# Patient Record
Sex: Female | Born: 1935 | Race: White | Hispanic: No | Smoking: Never smoker
Health system: Southern US, Community
[De-identification: ages and names within clinical notes are randomized; demographics above are authoritative.]

## PROBLEM LIST (undated history)

## (undated) DIAGNOSIS — K55069 Acute infarction of intestine, part and extent unspecified: Secondary | ICD-10-CM

## (undated) DIAGNOSIS — K859 Acute pancreatitis without necrosis or infection, unspecified: Secondary | ICD-10-CM

## (undated) DIAGNOSIS — I81 Portal vein thrombosis: Secondary | ICD-10-CM

## (undated) DIAGNOSIS — S7290XA Unspecified fracture of unspecified femur, initial encounter for closed fracture: Secondary | ICD-10-CM

## (undated) DIAGNOSIS — B1920 Unspecified viral hepatitis C without hepatic coma: Secondary | ICD-10-CM

## (undated) DIAGNOSIS — C259 Malignant neoplasm of pancreas, unspecified: Secondary | ICD-10-CM

## (undated) DIAGNOSIS — K831 Obstruction of bile duct: Secondary | ICD-10-CM

## (undated) DIAGNOSIS — E119 Type 2 diabetes mellitus without complications: Secondary | ICD-10-CM

## (undated) DIAGNOSIS — K8689 Other specified diseases of pancreas: Secondary | ICD-10-CM

## (undated) HISTORY — PX: ABDOMINAL HYSTERECTOMY: SHX81

## (undated) HISTORY — DX: Type 2 diabetes mellitus without complications: E11.9

## (undated) HISTORY — PX: APPENDECTOMY: SHX54

## (undated) HISTORY — PX: CHOLECYSTECTOMY: SHX55

---

## 1979-06-19 HISTORY — PX: TOTAL HIP ARTHROPLASTY: SHX124

## 2003-06-19 HISTORY — PX: INTRACAPSULAR CATARACT EXTRACTION: SHX361

## 2007-04-19 HISTORY — PX: OTHER SURGICAL HISTORY: SHX169

## 2007-05-02 ENCOUNTER — Inpatient Hospital Stay: Payer: Self-pay | Admitting: Specialist

## 2007-05-02 ENCOUNTER — Other Ambulatory Visit: Payer: Self-pay

## 2007-05-07 ENCOUNTER — Encounter: Payer: Self-pay | Admitting: Internal Medicine

## 2007-05-19 ENCOUNTER — Encounter: Payer: Self-pay | Admitting: Internal Medicine

## 2008-06-03 ENCOUNTER — Ambulatory Visit: Payer: Self-pay | Admitting: Ophthalmology

## 2008-06-22 ENCOUNTER — Ambulatory Visit: Payer: Self-pay | Admitting: Ophthalmology

## 2013-02-01 ENCOUNTER — Ambulatory Visit: Payer: Self-pay | Admitting: Family Medicine

## 2014-08-08 ENCOUNTER — Ambulatory Visit: Payer: Self-pay

## 2014-08-09 ENCOUNTER — Inpatient Hospital Stay: Payer: Self-pay | Admitting: Internal Medicine

## 2014-10-17 NOTE — H&P (Signed)
PATIENT NAMEKETSIA, [name] MR#:  [id number] DATE OF BIRTH:  1928-10-02  DATE OF ADMISSION:  08/09/2014  PRIMARY CARE PHYSICIAN:  Dr. Halina Maidens.    REFERRING EMERGENCY ROOM PHYSICIAN: Dr. Owens Shark.   CHIEF COMPLAINT: Abdominal pain.   HISTORY OF PRESENT ILLNESS: This very pleasant 81 year old woman with past medical history of chronic pancreatitis, hepatitis C, and newly diagnosed diabetes, presents today with midepigastric abdominal pain for several days. She actually saw her primary care provider yesterday with the same complaints, and was found to have pancreatitis with lipase elevated at 1400, hypokalemia with a potassium of 3, and a urinary tract infection. She was sent home with prescriptions for potassium and ciprofloxacin and told that if her pain continued she should present to the Emergency Room for further evaluation and so she has done so today. She describes a dull aching pain in the mid epigastrium. She has not had any nausea or vomiting, no diarrhea. She does not take her Creon as prescribed because she does not like it. She was recently changed from another brand of pancreatic enzyme. She drinks 2 alcoholic beverages daily. She does not know if her triglycerides have been elevated in the past. She has had several episodes of pancreatitis in the remote past, none recently. On presentation her lipase is 1218. Also of note she has been very constipated recently, she then took senna over-the-counter and has had diarrhea for the past 2 days.   PAST MEDICAL HISTORY:  1. Chronic pancreatitis.  2. Hepatitis C not currently treated.   PAST SURGICAL HISTORY:  1. Total hysterectomy.  2. Cataract surgery.  3. Cholecystectomy.  4. Left hip replacement and repair of left femur.   SOCIAL HISTORY: The patient lives with her daughter [name]. She has a cane, but does not use it very frequently. She has never been a cigarette smoker. She drinks about 2 glasses of wine daily. She does not use  any illicit substances. She is originally from [place].   FAMILY MEDICAL HISTORY: She does not know her family medical history, as her entire family died early.   HOME MEDICATIONS:  1. Senokot 50 mg-8.6 mg 1 tablet once a day at bedtime as needed for constipation.  2. Ondansetron 8 mg 3 times a day as needed for nausea and vomiting.  3. Klor-Con 20 mEq 1 tablet 2 times a day.  4. Creon 6000 units-19,000 units-30,000 units delayed release capsule, 3 capsules orally 3 times a day with meals.  5. Ciprofloxacin 500 mg 1 tablet twice a day.  6. Aspirin 81 mg 1 tablet 2 times a week.       REVIEW OF SYSTEMS:  CONSTITUTIONAL: Negative for fevers or chills. Positive for fatigue, weakness, and abdominal pain. No change in weight.  HEENT: No pain in eyes or ears. No change in vision or hearing. No sore throat or sinus congestion. No difficulty swallowing.  RESPIRATORY: No cough, wheezing, shortness of breath, hemoptysis, or painful respirations.  CARDIOVASCULAR: No chest pain, orthopnea, edema, or palpitations.  GASTROINTESTINAL: No vomiting. She has had some nausea. She has had diarrhea after taking senna, but before that was constipated. She does have midepigastric abdominal pain. No hematemesis, melena, or hematochezia.  GENITOURINARY: Positive for dysuria and frequency.  ENDOCRINE: No polyuria or polydipsia. No hot or cold intolerance.  SKIN: No new rashes or lesions.  MUSCULOSKELETAL: No new pain in the neck, back, shoulders, knees, or hips. She does have arthritis and has a little bit of difficulty moving her  left hip which has been replaced. No swelling, no gout.  NEUROLOGIC: No focal numbness or weakness. No history of CVA or seizure, no dementia or memory loss.  PSYCHIATRIC: The patient is calm. No bipolar disorder or schizophrenia.   PHYSICAL EXAMINATION:  VITAL SIGNS: Temperature 97.6, pulse 77, respirations 16, blood pressure 188/75, oxygenation 98% on room air.  GENERAL: No acute  distress.  HEENT: Pupils equal, round, and reactive to light. Conjunctivae clear. Extraocular motion intact. Oral mucous membranes are pink and moist. Posterior oropharynx is clear with no exudate, edema, or erythema.  NECK: Supple, trachea midline. No cervical lymphadenopathy. Thyroid is nontender.  RESPIRATORY: Lungs clear to auscultation bilaterally with good air movement.  CARDIOVASCULAR: Regular rate and rhythm. No murmurs, rubs, or gallops. No peripheral edema. Peripheral pulses are 2 +.  ABDOMEN: Abdomen is obese, slightly distended. There is tenderness to palpation in the mid epigastric area. No guarding, no rebound, no tenderness in other areas of the abdomen. No hepatosplenomegaly noted. No mass.  MUSCULOSKELETAL: No joint effusions. Range of motion of all joints except for the left hip is normal. She has weakness of the left quadriceps and decreased range of motion, otherwise strength is 5 out of 5 throughout.   SKIN: No rashes, lesions, or wounds.  NEUROLOGIC: Cranial nerves II through XII grossly intact. Strength and sensation intact and equal bilaterally, nonfocal.  PSYCHIATRIC: The patient alert and oriented x 4 with good insight into her clinical situation. No signs of uncontrolled depression or anxiety.   LABORATORY DATA: Sodium 137, potassium 3.7, chloride 104, bicarbonate 24, BUN 17, creatinine 0.89, glucose 301. Total cholesterol 168, LDL 85, HDL 65. Lipase 1218. Hemoglobin A1c is 7.4. LFTs are normal. Troponin less than 0.02. White blood cells 8.6, hemoglobin 13.8, platelets 255,000, MCV 96. UA is positive for infection with 114 white blood cells per high-powered field and 23 red blood cells per high-powered field.       IMAGING: There has been no imaging.   ASSESSMENT AND PLAN:  1.  Pancreatitis: Acute on chronic pancreatitis likely due to persistent alcohol use and noncompliance with pancreatic replacement enzymes. She is being admitted for IV fluid hydration and she will be  kept n.p.o. Pain control with Percocet for moderate pain and IV morphine for severe pain. She does not seem to be in too much distress at this time. 2.  Hyperglycemia: Hemoglobin A1c is elevated at 7.4, however in this age group at age 49 the A1c goal is actually 8 and less, so she would not be a candidate for aggressive blood sugar management. I think that her blood sugar today is elevated due to the pancreatitis and volume depletion. We will provide IV hydration, put her on sliding scale. Could consider metformin on discharge but lifestyle modification would probably be adequate.  3.  Urinary tract infection: Obtain urine culture. We will start Rocephin daily. She has had only 1-2 doses of ciprofloxacin, so hopefully culture we will revealing.  4.  Hypokalemia: Yesterday she was hypokalemic at her primary care office with a potassium of 3.0, this is normal now. Continue to monitor.  5.  Hypertension: On presentation her blood pressure is elevated at 188/75. She has received hydralazine once in the Emergency Room. She is not on anything at home for hypertension. It may be beneficial to start this patient on lisinopril 20 mg daily if her blood pressures continue to be elevated.  6.  Prophylaxis: Heparin for DVT prophylaxis, no GI prophylaxis at this time.  TIME SPENT ON ADMISSION: 40 minutes.     ____________________________ Earleen Newport. Volanda Napoleon, MD cpw:bu D: 08/09/2014 16:16:43 ET T: 08/09/2014 16:50:02 ET JOB#: [id number]  cc: Barnetta Chapel P. Volanda Napoleon, MD, <Dictator> Aldean Jewett MD ELECTRONICALLY SIGNED 08/10/2014 8:22

## 2014-10-17 NOTE — Discharge Summary (Signed)
PATIENT [name], MAYO MR#:  [id number] DATE OF BIRTH:  Jun 03, 1929  DATE OF ADMISSION:  08/09/2014 DATE OF DISCHARGE:  08/11/2014  PRESENTING COMPLAINT: Abdominal pain.   DISCHARGE DIAGNOSES:  1.  Acute on chronic pancreatitis.  2.  History of hepatitis C.  3.  Hypertension, new diagnosis. 4.  Hyperglycemia with A1c of 7.4.  5.  Urinary tract infection.  CODE STATUS: Full code.   MEDICATIONS:  1.  Klor-Con 20 mEq 1 tablet daily for 1 week.  2.  Creon 3 capsules 3 times a day with meals.  3.  Zofran 8 mg p.r.n. as needed.  4.  Senokot S 1 tablet daily at bedtime as needed.  5.  Aspirin 81 mg 2 times a week.  6.  Keflex 500 mg b.i.d.  7.  Amlodipine 5 mg daily.    DIET: Mechanical soft diet.   FOLLOWUP: With Dr. Halina Maidens in 1-2 weeks.   LABORATORY DATA: Lipase at admission was 1218, lipase at discharge was 309.   HOSPITAL COURSE: [name] is an 81 year old Caucasian female with history of chronic pancreatitis and history of Hepatitis C who comes into the Emergency Room with: 1.  Pancreatitis, acute on chronic, likely due to persistent on and off alcohol use and noncompliance with pancreatic replacement enzymes. The patient was kept n.p.o., continued IV fluids. Her lipase came down from 1200 to 306. She remained asymptomatic, tolerating soft diet prior to discharge. She was advised to resume back her pancreatic replacement enzymes as well.  2.  Hyperglycemia A1c is elevated at 7.4. The patient will hold off on starting any p.o. medications. We will defer to primary care physician.  3.  Urinary tract infection on p.o. Keflex.  4.  Hypokalemia repleted.  5.  Hypertension, on presentation in Emergency Room blood pressure was 188/75. She received some hydralazine p.r.n. in the Emergency Room. After discussing with the patient we started on amlodipine 5 mg daily. She will follow up with her primary care physician.  6.  The patient was kept on deep vein thrombosis  prophylaxis with subcutaneous heparin. Hospital stay otherwise remained stable.   CODE STATUS: The patient remained a full code.   TIME SPENT: 40 minutes.   ____________________________ Hart Rochester Posey Pronto, MD sap:mc D: 08/12/2014 06:38:13 ET T: 08/12/2014 14:12:57 ET JOB#: [id number]  cc: Koven Belinsky A. Posey Pronto, MD, <Dictator> Halina Maidens, MD  Ilda Basset MD ELECTRONICALLY SIGNED 08/17/2014 17:36

## 2014-10-18 LAB — LIPID PANEL
Cholesterol: 144 mg/dL (ref 0–200)
HDL: 37 mg/dL (ref 35–70)
LDL Cholesterol: 86 mg/dL
Triglycerides: 107 mg/dL (ref 40–160)

## 2014-10-18 LAB — HEMOGLOBIN A1C: Hgb A1c MFr Bld: 9.5 % — AB (ref 4.0–6.0)

## 2014-10-19 ENCOUNTER — Encounter: Payer: Self-pay | Admitting: Emergency Medicine

## 2014-10-19 ENCOUNTER — Inpatient Hospital Stay
Admission: EM | Admit: 2014-10-19 | Discharge: 2014-10-25 | DRG: 438 | Disposition: A | Discharge status: Home / Self Care | Payer: Commercial Managed Care - HMO | Attending: Internal Medicine | Admitting: Internal Medicine

## 2014-10-19 ENCOUNTER — Emergency Department: Payer: Commercial Managed Care - HMO

## 2014-10-19 ENCOUNTER — Other Ambulatory Visit: Payer: Commercial Managed Care - HMO

## 2014-10-19 DIAGNOSIS — Z96649 Presence of unspecified artificial hip joint: Secondary | ICD-10-CM | POA: Diagnosis present

## 2014-10-19 DIAGNOSIS — R112 Nausea with vomiting, unspecified: Secondary | ICD-10-CM | POA: Diagnosis not present

## 2014-10-19 DIAGNOSIS — I10 Essential (primary) hypertension: Secondary | ICD-10-CM | POA: Diagnosis present

## 2014-10-19 DIAGNOSIS — E876 Hypokalemia: Secondary | ICD-10-CM | POA: Diagnosis present

## 2014-10-19 DIAGNOSIS — B1920 Unspecified viral hepatitis C without hepatic coma: Secondary | ICD-10-CM | POA: Diagnosis present

## 2014-10-19 DIAGNOSIS — Z79899 Other long term (current) drug therapy: Secondary | ICD-10-CM | POA: Diagnosis not present

## 2014-10-19 DIAGNOSIS — E1165 Type 2 diabetes mellitus with hyperglycemia: Secondary | ICD-10-CM | POA: Diagnosis present

## 2014-10-19 DIAGNOSIS — K868 Other specified diseases of pancreas: Principal | ICD-10-CM | POA: Diagnosis present

## 2014-10-19 DIAGNOSIS — K861 Other chronic pancreatitis: Secondary | ICD-10-CM | POA: Diagnosis present

## 2014-10-19 DIAGNOSIS — M810 Age-related osteoporosis without current pathological fracture: Secondary | ICD-10-CM | POA: Diagnosis present

## 2014-10-19 DIAGNOSIS — I82890 Acute embolism and thrombosis of other specified veins: Secondary | ICD-10-CM | POA: Diagnosis present

## 2014-10-19 DIAGNOSIS — K859 Acute pancreatitis without necrosis or infection, unspecified: Secondary | ICD-10-CM | POA: Insufficient documentation

## 2014-10-19 DIAGNOSIS — H6690 Otitis media, unspecified, unspecified ear: Secondary | ICD-10-CM | POA: Diagnosis present

## 2014-10-19 DIAGNOSIS — R1013 Epigastric pain: Secondary | ICD-10-CM | POA: Diagnosis present

## 2014-10-19 DIAGNOSIS — K8689 Other specified diseases of pancreas: Secondary | ICD-10-CM | POA: Diagnosis present

## 2014-10-19 DIAGNOSIS — K55069 Acute infarction of intestine, part and extent unspecified: Secondary | ICD-10-CM

## 2014-10-19 DIAGNOSIS — I81 Portal vein thrombosis: Secondary | ICD-10-CM | POA: Diagnosis present

## 2014-10-19 DIAGNOSIS — Z66 Do not resuscitate: Secondary | ICD-10-CM | POA: Diagnosis present

## 2014-10-19 HISTORY — DX: Unspecified viral hepatitis C without hepatic coma: B19.20

## 2014-10-19 HISTORY — DX: Unspecified fracture of unspecified femur, initial encounter for closed fracture: S72.90XA

## 2014-10-19 HISTORY — DX: Acute pancreatitis without necrosis or infection, unspecified: K85.90

## 2014-10-19 LAB — CBC WITH DIFFERENTIAL/PLATELET
BASOS ABS: 0.1 10*3/uL (ref 0–0.1)
Basophils Relative: 1 %
EOS ABS: 0.1 10*3/uL (ref 0–0.7)
Eosinophils Relative: 1 %
HEMATOCRIT: 37.3 % (ref 35.0–47.0)
HEMOGLOBIN: 12.1 g/dL (ref 12.0–16.0)
Lymphocytes Relative: 6 %
Lymphs Abs: 0.5 10*3/uL — ABNORMAL LOW (ref 1.0–3.6)
MCH: 31.1 pg (ref 26.0–34.0)
MCHC: 32.6 g/dL (ref 32.0–36.0)
MCV: 95.4 fL (ref 80.0–100.0)
MONO ABS: 0.9 10*3/uL (ref 0.2–0.9)
NEUTROS ABS: 7 10*3/uL — AB (ref 1.4–6.5)
Neutrophils Relative %: 81 %
Platelets: 426 10*3/uL (ref 150–440)
RBC: 3.91 MIL/uL (ref 3.80–5.20)
RDW: 13.9 % (ref 11.5–14.5)
WBC: 8.6 10*3/uL (ref 3.6–11.0)

## 2014-10-19 LAB — COMPREHENSIVE METABOLIC PANEL
ALT: 17 U/L (ref 14–54)
ANION GAP: 13 (ref 5–15)
AST: 25 U/L (ref 15–41)
Albumin: 2.9 g/dL — ABNORMAL LOW (ref 3.5–5.0)
Alkaline Phosphatase: 84 U/L (ref 38–126)
BUN: 19 mg/dL (ref 6–20)
CO2: 25 mmol/L (ref 22–32)
Calcium: 8.7 mg/dL — ABNORMAL LOW (ref 8.9–10.3)
Chloride: 103 mmol/L (ref 101–111)
Creatinine, Ser: 0.5 mg/dL (ref 0.44–1.00)
GFR calc Af Amer: >60 mL/min (ref >60)
GFR calc non Af Amer: >60 mL/min (ref >60)
Glucose, Bld: 212 mg/dL — ABNORMAL HIGH (ref 65–99)
Potassium: 4 mmol/L (ref 3.5–5.1)
Sodium: 141 mmol/L (ref 135–145)
TOTAL PROTEIN: 7.2 g/dL (ref 6.5–8.1)
Total Bilirubin: 1.1 mg/dL (ref 0.3–1.2)

## 2014-10-19 LAB — URINALYSIS COMPLETE WITH MICROSCOPIC (ARMC ONLY)
Bacteria, UA: NONE SEEN
Bilirubin Urine: NEGATIVE
Glucose, UA: 150 mg/dL — AB
Nitrite: NEGATIVE
PROTEIN: 30 mg/dL — AB
Specific Gravity, Urine: 1.025 (ref 1.005–1.030)
pH: 5 (ref 5.0–8.0)

## 2014-10-19 LAB — CBC
HCT: 33.3 % — ABNORMAL LOW (ref 35.0–47.0)
HEMOGLOBIN: 11.1 g/dL — AB (ref 12.0–16.0)
MCH: 31.5 pg (ref 26.0–34.0)
MCHC: 33.4 g/dL (ref 32.0–36.0)
MCV: 94.1 fL (ref 80.0–100.0)
PLATELETS: 365 10*3/uL (ref 150–440)
RBC: 3.54 MIL/uL — ABNORMAL LOW (ref 3.80–5.20)
RDW: 13.8 % (ref 11.5–14.5)
WBC: 8.2 10*3/uL (ref 3.6–11.0)

## 2014-10-19 LAB — GLUCOSE, CAPILLARY
Glucose-Capillary: 191 mg/dL — ABNORMAL HIGH (ref 70–99)
Glucose-Capillary: 191 mg/dL — ABNORMAL HIGH (ref 70–99)

## 2014-10-19 LAB — PROTIME-INR
INR: 1.01
PROTHROMBIN TIME: 13.5 s (ref 11.4–15.0)

## 2014-10-19 LAB — CREATININE, SERUM
Creatinine, Ser: 0.33 mg/dL — ABNORMAL LOW (ref 0.44–1.00)
GFR calc non Af Amer: >60 mL/min (ref >60)

## 2014-10-19 LAB — LIPASE, BLOOD: LIPASE: 134 U/L — AB (ref 22–51)

## 2014-10-19 MED ORDER — ONDANSETRON HCL 4 MG/2ML IJ SOLN
4.0000 mg | Freq: Once | INTRAMUSCULAR | Status: AC
  Administered 2014-10-19: 4 mg via INTRAVENOUS

## 2014-10-19 MED ORDER — PANCRELIPASE (LIP-PROT-AMYL) 12000-38000 UNITS PO CPEP
24000.0000 [IU] | ORAL_CAPSULE | Freq: Three times a day (TID) | ORAL | Status: DC
Start: 2014-10-20 — End: 2014-10-19

## 2014-10-19 MED ORDER — INSULIN ASPART 100 UNIT/ML ~~LOC~~ SOLN: 0.0000 [IU] | Freq: Three times a day (TID) | SUBCUTANEOUS | Status: DC

## 2014-10-19 MED ORDER — HEPARIN (PORCINE) IN NACL 100-0.45 UNIT/ML-% IJ SOLN
780.0000 [IU]/h | INTRAMUSCULAR | Status: DC
  Administered 2014-10-19: 20:00:00 800 [IU]/h via INTRAVENOUS
  Filled 2014-10-19: qty 250

## 2014-10-19 MED ORDER — HEPARIN (PORCINE) IN NACL 100-0.45 UNIT/ML-% IJ SOLN: 10.0000 [IU]/kg/h | INTRAMUSCULAR | Status: DC

## 2014-10-19 MED ORDER — IOHEXOL 300 MG/ML  SOLN
100.0000 mL | Freq: Once | INTRAMUSCULAR | Status: AC | PRN
  Administered 2014-10-19: 100 mL via INTRAVENOUS

## 2014-10-19 MED ORDER — FENTANYL CITRATE (PF) 100 MCG/2ML IJ SOLN
INTRAMUSCULAR | Status: AC
  Administered 2014-10-19: 50 ug via INTRAVENOUS
  Filled 2014-10-19: qty 2

## 2014-10-19 MED ORDER — SENNOSIDES-DOCUSATE SODIUM 8.6-50 MG PO TABS: 1.0000 | ORAL_TABLET | Freq: Every evening | ORAL | Status: DC | PRN

## 2014-10-19 MED ORDER — PANCRELIPASE (LIP-PROT-AMYL) 12000-38000 UNITS PO CPEP
12000.0000 [IU] | ORAL_CAPSULE | Freq: Three times a day (TID) | ORAL | Status: DC | PRN
  Administered 2014-10-20: 14:00:00 12000 [IU] via ORAL
  Filled 2014-10-19 (×2): qty 1

## 2014-10-19 MED ORDER — SODIUM CHLORIDE 0.9 % IV BOLUS (SEPSIS)
1000.0000 mL | Freq: Once | INTRAVENOUS | Status: AC
  Administered 2014-10-19: 1000 mL via INTRAVENOUS

## 2014-10-19 MED ORDER — INSULIN ASPART 100 UNIT/ML ~~LOC~~ SOLN
0.0000 [IU] | Freq: Three times a day (TID) | SUBCUTANEOUS | Status: DC
  Administered 2014-10-20: 14:00:00 3 [IU] via SUBCUTANEOUS
  Administered 2014-10-20: 17:00:00 2 [IU] via SUBCUTANEOUS
  Administered 2014-10-20: 5 [IU] via SUBCUTANEOUS
  Administered 2014-10-21 – 2014-10-24 (×7): 2 [IU] via SUBCUTANEOUS
  Administered 2014-10-24: 13:00:00 3 [IU] via SUBCUTANEOUS
  Administered 2014-10-24: 2 [IU] via SUBCUTANEOUS
  Administered 2014-10-25: 09:00:00 via SUBCUTANEOUS
  Administered 2014-10-25: 12:00:00 5 [IU] via SUBCUTANEOUS
  Filled 2014-10-19 (×4): qty 2
  Filled 2014-10-19: qty 3
  Filled 2014-10-19: qty 5
  Filled 2014-10-19 (×4): qty 2
  Filled 2014-10-19: qty 5
  Filled 2014-10-19: qty 2
  Filled 2014-10-19: qty 5
  Filled 2014-10-19: qty 2
  Filled 2014-10-19: qty 1

## 2014-10-19 MED ORDER — HEPARIN SODIUM (PORCINE) 5000 UNIT/ML IJ SOLN: 5000.0000 [IU] | Freq: Three times a day (TID) | INTRAMUSCULAR | Status: DC

## 2014-10-19 MED ORDER — ALUM & MAG HYDROXIDE-SIMETH 200-200-20 MG/5ML PO SUSP: 30.0000 mL | Freq: Four times a day (QID) | ORAL | Status: DC | PRN

## 2014-10-19 MED ORDER — FENTANYL CITRATE (PF) 100 MCG/2ML IJ SOLN
50.0000 ug | Freq: Once | INTRAMUSCULAR | Status: AC
  Administered 2014-10-19: 50 ug via INTRAVENOUS

## 2014-10-19 MED ORDER — ONDANSETRON HCL 4 MG/2ML IJ SOLN
INTRAMUSCULAR | Status: AC
  Filled 2014-10-19: qty 2

## 2014-10-19 MED ORDER — ONDANSETRON HCL 4 MG PO TABS
4.0000 mg | ORAL_TABLET | Freq: Four times a day (QID) | ORAL | Status: DC | PRN
  Filled 2014-10-19: qty 1

## 2014-10-19 MED ORDER — ONDANSETRON HCL 4 MG/2ML IJ SOLN: 4.0000 mg | Freq: Once | INTRAMUSCULAR | Status: AC

## 2014-10-19 MED ORDER — DOCUSATE SODIUM 100 MG PO CAPS
100.0000 mg | ORAL_CAPSULE | Freq: Two times a day (BID) | ORAL | Status: DC
  Administered 2014-10-20 – 2014-10-25 (×12): 100 mg via ORAL
  Filled 2014-10-19 (×11): qty 1

## 2014-10-19 MED ORDER — ONDANSETRON HCL 4 MG/2ML IJ SOLN
INTRAMUSCULAR | Status: AC
  Administered 2014-10-19: 4 mg via INTRAVENOUS
  Filled 2014-10-19: qty 2

## 2014-10-19 MED ORDER — MORPHINE SULFATE 4 MG/ML IJ SOLN: 4.0000 mg | Freq: Once | INTRAMUSCULAR | Status: DC

## 2014-10-19 MED ORDER — ACETAMINOPHEN 650 MG RE SUPP: 650.0000 mg | Freq: Four times a day (QID) | RECTAL | Status: DC | PRN

## 2014-10-19 MED ORDER — ONDANSETRON HCL 4 MG/2ML IJ SOLN
4.0000 mg | Freq: Four times a day (QID) | INTRAMUSCULAR | Status: DC | PRN
  Administered 2014-10-19 – 2014-10-20 (×2): 4 mg via INTRAVENOUS
  Filled 2014-10-19 (×2): qty 2

## 2014-10-19 MED ORDER — ACETAMINOPHEN 325 MG PO TABS: 650.0000 mg | ORAL_TABLET | Freq: Four times a day (QID) | ORAL | Status: DC | PRN

## 2014-10-19 MED ORDER — HEPARIN (PORCINE) IN NACL 2-0.9 UNIT/ML-% IJ SOLN
INTRAMUSCULAR | Status: DC
  Filled 2014-10-19: qty 1000

## 2014-10-19 MED ORDER — HYDROMORPHONE HCL 1 MG/ML IJ SOLN
1.0000 mg | INTRAMUSCULAR | Status: DC | PRN
  Administered 2014-10-19 – 2014-10-24 (×5): 1 mg via INTRAVENOUS
  Filled 2014-10-19 (×6): qty 1

## 2014-10-19 MED ORDER — HEPARIN BOLUS VIA INFUSION
3900.0000 [IU] | INTRAVENOUS | Status: AC
  Administered 2014-10-19: 19:00:00 3900 [IU] via INTRAVENOUS
  Filled 2014-10-19: qty 3900

## 2014-10-19 MED ORDER — AMLODIPINE BESYLATE 5 MG PO TABS
5.0000 mg | ORAL_TABLET | Freq: Every day | ORAL | Status: DC
  Administered 2014-10-19 – 2014-10-25 (×6): 5 mg via ORAL
  Filled 2014-10-19 (×7): qty 1

## 2014-10-19 MED ORDER — ONDANSETRON 4 MG PO TBDP: 4.0000 mg | ORAL_TABLET | ORAL | Status: DC | PRN

## 2014-10-19 MED ORDER — PANCRELIPASE (LIP-PROT-AMYL) 12000-38000 UNITS PO CPEP
24000.0000 [IU] | ORAL_CAPSULE | Freq: Three times a day (TID) | ORAL | Status: DC
  Administered 2014-10-19 – 2014-10-25 (×14): 24000 [IU] via ORAL
  Administered 2014-10-25: 09:00:00 12000 [IU] via ORAL
  Filled 2014-10-19 (×14): qty 2

## 2014-10-19 MED ORDER — ONDANSETRON HCL 4 MG/2ML IJ SOLN
4.0000 mg | Freq: Once | INTRAMUSCULAR | Status: AC
  Administered 2014-10-19: 4 mg via INTRAVENOUS
  Filled 2014-10-19: qty 2

## 2014-10-19 NOTE — Progress Notes (Signed)
ANTICOAGULATION CONSULT NOTE - Initial Consult  Pharmacy Consult for Portal Vein Thrombosis Indication: portal vein thrombosis  Allergies  Allergen Reactions  . Aspirin Other (See Comments)    Stomach problems    Patient Measurements: Height: 5\' 2"  (157.5 cm) Weight: 158 lb 11.2 oz (71.986 kg) IBW/kg (Calculated) : 50.1 Heparin Dosing Weight: 65.3 kg  Vital Signs: Temp: 98.4 F (36.9 C) (05/03 2127) Temp Source: Oral (05/03 2127) BP: 155/62 mmHg (05/03 2127) Pulse Rate: 90 (05/03 2127)  Labs:  Recent Labs  10/19/14 1012 10/19/14 1656 10/19/14 1815  HGB 12.1  --  11.1*  HCT 37.3  --  33.3*  PLT 426  --  365  LABPROT  --  13.5  --   INR  --  1.01  --   CREATININE 0.50  --  0.33*    Estimated Creatinine Clearance: 49.5 mL/min (by C-G formula based on Cr of 0.33).   Medical History: Past Medical History  Diagnosis Date  . Pancreatitis   . Hepatitis C   . Femur fracture   . H/O: hysterectomy   . History of cholecystectomy   . Hx of appendectomy     Medications:  Scheduled:  . amLODipine  5 mg Oral Daily  . docusate sodium  100 mg Oral BID  . [START ON 10/20/2014] insulin aspart  0-9 Units Subcutaneous TID WC  . lipase/protease/amylase  24,000 Units Oral TID AC  . ondansetron (ZOFRAN) IV  4 mg Intravenous Once    Assessment:  Goal of Therapy:  Heparin level 0.3-0.7 units/ml Monitor platelets by anticoagulation protocol: Yes   Plan:  Give 3900 units bolus x 1  Begin heparin drip @ 780 units/hr. Draw 1st anti-Xa 8 hrs after start of drip on 5/4 @ 4:00.   Boyd Buffalo D 10/19/2014,10:40 PM

## 2014-10-19 NOTE — H&P (Signed)
[facility] [place]    MR#:  [id number]  DATE OF BIRTH:  February 24, 1929  DATE OF ADMISSION:  10/19/2014  PRIMARY CARE PHYSICIAN: Halina Maidens, MD   REQUESTING/REFERRING PHYSICIAN: Dr. Charlotte Sanes  CHIEF COMPLAINT:   Severe abdominal pain HISTORY OF PRESENT ILLNESS:  [name]  is a 81 y.o. female with a known history of chronic pancreatitis and newly diagnosed hypertension and diabetes who presents with abdominal pain for the past several months. Patient was admitted in February at that time she was diagnosed with chronic pancreatitis. Per family member who is at bedside, her abdominal pain never quite resolved she was recently treated for urinary tract infection. Over the past 2 days her abdominal pain has worsened so she came to the ER for further evaluation. In the ER a CAT scan was performed which showed a pink reticulocyte mass and also portal thrombosis. GI as well as vascular surgery has been consultative. Dr. Georgann Housekeeper recommends starting on heparin drip.  PAST MEDICAL HISTORY:   Past Medical History  Diagnosis Date  . Pancreatitis   . Hepatitis C   . Femur fracture   . H/O: hysterectomy   . History of cholecystectomy   . Hx of appendectomy     PAST SURGICAL HISTOIRY:   Past Surgical History  Procedure Laterality Date  . Total hip arthroplasty    . Abdominal hysterectomy    . Appendectomy    . Cholecystectomy      SOCIAL HISTORY:   History  Substance Use Topics  . Smoking status: Never Smoker   . Smokeless tobacco: Not on file  . Alcohol Use: No    FAMILY HISTORY:  History reviewed. No pertinent family history.  DRUG ALLERGIES:   Allergies  Allergen Reactions  . Aspirin Other (See Comments)    Stomach problems    REVIEW OF SYSTEMS:  CONSTITUTIONAL: No fever, positive fatigue positive weakness.  EYES: No blurred or double vision.  EARS, NOSE, AND THROAT: No tinnitus or ear  pain.  RESPIRATORY: No cough, shortness of breath, wheezing or hemoptysis.  CARDIOVASCULAR: No chest pain, orthopnea, edema.  GASTROINTESTINAL: Positive diffuse abdominal pain associated with nausea and vomiting. GENITOURINARY: No dysuria, hematuria.  ENDOCRINE: No polyuria, nocturia,  HEMATOLOGY: No anemia, easy bruising or bleeding SKIN: No rash or lesion. MUSCULOSKELETAL: No joint pain or arthritis.   NEUROLOGIC: No tingling, numbness, weakness.  PSYCHIATRY: No anxiety or depression.   MEDICATIONS AT HOME:   Prior to Admission medications   Medication Sig Start Date End Date Taking? Authorizing Provider  amLODipine (NORVASC) 5 MG tablet Take 1 tablet by mouth daily. Take one tablet daily 10/08/14  Yes Historical Provider, MD  CREON 6000 UNITS CPEP Take 3 capsules by mouth 3 (three) times daily before meals. Patient may take 1 capsule before snack in between meals 10/13/14  Yes Historical Provider, MD  docusate sodium (COLACE) 100 MG capsule Take 100 mg by mouth 2 (two) times daily.   Yes Historical Provider, MD  metFORMIN (GLUCOPHAGE) 500 MG tablet Take 1 tablet by mouth daily. 10/18/14  Yes Historical Provider, MD  ondansetron (ZOFRAN-ODT) 8 MG disintegrating tablet Take 1 tablet by mouth as needed. 08/08/14   Historical Provider, MD      VITAL SIGNS:  Blood pressure 157/78, pulse 103, temperature 97.9 F (36.6 C), temperature source Oral, resp. rate 23, height 5\' 2"  (1.575 m), weight 71.668 kg (158 lb), SpO2 91 %.  PHYSICAL EXAMINATION:  GENERAL:  81 y.o.-year-old patient lying in the bed with no acute distress.  EYES: Pupils equal, round, reactive to light and accommodation. No scleral icterus. Extraocular muscles intact.  HEENT: Head atraumatic, normocephalic. Oropharynx and nasopharynx clear.  NECK:  Supple, no jugular venous distention. No thyroid enlargement, no tenderness.  LUNGS: Normal breath sounds bilaterally, no wheezing, rales,rhonchi or crepitation. No use of accessory  muscles of respiration.  CARDIOVASCULAR: S1, S2 normal. No murmurs, rubs, or gallops.  ABDOMEN: Soft, nontender, nondistended. Bowel sounds present. No organomegaly EXTREMITIES: No pedal edema, cyanosis, or clubbing.  NEUROLOGIC: Cranial nerves II through XII are intact. Muscle strength 5/5 in all extremities. Sensation intact. Gait not checked.  PSYCHIATRIC: The patient is alert and oriented x 3.  SKIN: No obvious rash, lesion, or ulcer.   LABORATORY PANEL:   CBC  Recent Labs Lab 10/19/14 1012  WBC 8.6  HGB 12.1  HCT 37.3  PLT 426   ------------------------------------------------------------------------------------------------------------------  Chemistries   Recent Labs Lab 10/19/14 1012  NA 141  K 4.0  CL 103  CO2 25  GLUCOSE 212*  BUN 19  CREATININE 0.50  CALCIUM 8.7*  AST 25  ALT 17  ALKPHOS 84  BILITOT 1.1   ------------------------------------------------------------------------------------------------------------------  Cardiac Enzymes No results for input(s): TROPONINI in the last 168 hours. ------------------------------------------------------------------------------------------------------------------  RADIOLOGY:  Ct Abdomen Pelvis W Contrast  10/19/2014    IMPRESSION: 1. There is mild intrahepatic and extrahepatic biliary ductal dilatation. There is a mass in pancreatic body obstructing the main pancreatic duct. Significant dilatation of main pancreatic duct with indistinctness of the body and tail of the pancreas. Probable adjacent pathologic lymph nodes anterior to the pancreas. 2. There is complete thrombosis of portal vein with extension in intrahepatic portal vein. There is complete thrombosis of splenic vein and superior mesenteric vein. Tumor thrombus cannot be excluded. Some collateral vessels are noted in splenic hilum and anterior to left kidney. There is mild stranding of peripancreatic fat and fat surrounding the superior mesenteric artery.  This may be due to edema mild pancreatitis or tumor extension. 3. Atherosclerotic calcifications of abdominal aorta and iliac arteries. No aortic aneurysm. 4. No small bowel obstruction. 5. Moderate size hiatal hernia measures 7 cm. 6. Osteopenia and degenerative changes thoracolumbar spine. These results were called by telephone at the time of interpretation on 10/19/2014 at 3:20 pm to Dr. Ferman Hamming , who verbally acknowledged these results.   Electronically Signed   By: Lahoma Crocker M.D.   On: 10/19/2014 15:21    EKG:   Orders placed or performed during the hospital encounter of 10/19/14  . ED EKG  . ED EKG    IMPRESSION AND PLAN:   1. Abdominal pain: Likely secondary to a mass in pancreatic body obstructing the main pancreatic duct along with thrombosis of splenic vein and superior mesenteric vein and portal vein. She will need pain medications and plan as outlined below for abdominal pain. 2. Pancreatic mass: Patient will require GI consultation she will possibly need an ERCP or a biopsy by radiology. 3. 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 Patient understands these risks and benefits and except these risk, heparin drip will be started. I have also ordered a hypercoagulable panel. 4. Newly diagnosed hypertension: Patient will continue Norvasc 5.Newly diagnosed diabetes: Patient will be placed on sliding scale insulin. 6. Chronic pink otitis: I will continue Creon. GI has been consulted.  All the records are reviewed and case discussed with ED provider. Management plans discussed with the patient, family and they are in agreement.  CODE STATUS: DO NOT RESUSCITATE  TOTAL TIME TAKING CARE OF THIS PATIENT: 50 minutes.    Vika Buske M.D on 10/19/2014 at 4:22 PM  Between 7am to 6pm - Pager - [phone] After 6pm go to [email or web address] - password [email or web address]  [facility]  Office  [phone]  CC: Primary care physician; Halina Maidens, MD

## 2014-10-19 NOTE — ED Provider Notes (Signed)
[facility] Emergency Department Provider Note    ____________________________________________  Time seen: 12:10 PM  I have reviewed the triage vital signs and the nursing notes.   HISTORY  Chief Complaint Nausea  Patient is being interviewed in the emergency department presence of her daughter who is providing a lot of the past medical history and recent medical history.    HPI [name] is a 81 y.o. female who presents to the emergency department with intermittent abdominal pain. Onset of symptoms 2 months ago when she presented to the emergency department with abdominal pain nausea and vomiting and was diagnosed with acute pancreatitis. She was admitted to the hospital at that time and and was dismissed after a 2 day hospital stay and was diagnosed with acute pancreatitis and new onset diabetes. At that time she was dismissed on Creon metformin and Zofran also on amlodipine.  Over the past 2 weeks she has had increased abdominal pain. She describes the pain as diffuse and all over the abdomen it has been constant for the past 3 days it is moderate to severe into severity she ranks it as 7 out of 10 and the pain appears to be aching and stabbing in nature. She is anorexic and since the pain is been constant she hasn't been eating for the past day, and also she has had associated symptoms of constipation, last bowel movement was 2 days ago despite laxatives including Metamucil. She has been drinking fluids today but has not been able to eat because of the pain.  She has not had any change in her recent medication she has not had any recent surgery. She did see Dr. Army Melia yesterday because of the pain at that time laboratory evaluation was done which reviewed hyperglycemia and a possible urinary tract infection. No new medications were given she was instructed to come to the emergency department if her pain continued.     Past Medical History  Diagnosis  Date  . Pancreatitis   . Hepatitis C   . Femur fracture   . H/O: hysterectomy   . History of cholecystectomy   . Hx of appendectomy     Patient Active Problem List   Diagnosis Date Noted  . Pancreatic mass 10/19/2014    Past Surgical History  Procedure Laterality Date  . Total hip arthroplasty    . Abdominal hysterectomy    . Appendectomy    . Cholecystectomy      Current Outpatient Rx  Name  Route  Sig  Dispense  Refill  . amLODipine (NORVASC) 5 MG tablet   Oral   Take 1 tablet by mouth daily. Take one tablet daily         . CREON 6000 UNITS CPEP   Oral   Take 3 capsules by mouth 3 (three) times daily before meals. Patient may take 1 capsule before snack in between meals           Dispense as written.   . docusate sodium (COLACE) 100 MG capsule   Oral   Take 100 mg by mouth 2 (two) times daily.         . metFORMIN (GLUCOPHAGE) 500 MG tablet   Oral   Take 1 tablet by mouth daily.         . ondansetron (ZOFRAN-ODT) 8 MG disintegrating tablet   Oral   Take 1 tablet by mouth as needed.           Allergies Aspirin  History reviewed. No  pertinent family history.  Social History History  Substance Use Topics  . Smoking status: Never Smoker   . Smokeless tobacco: Not on file  . Alcohol Use: No    Review of Systems  Constitutional: Negative for fever. Eyes: Negative for visual changes. ENT: Negative for sore throat. Cardiovascular: Negative for chest pain. Respiratory: Negative for shortness of breath. Gastrointestinal: Positive for abdominal pain, no vomiting and diarrhea. Positive for constipation. Positive for anorexia. Positive for gas. Genitourinary: Negative for dysuria. Musculoskeletal: Negative for back pain. Skin: Negative for rash. Neurological: Negative for headaches, focal weakness or numbness. She appears very anxious.  10-point ROS otherwise negative.  ____________________________________________   PHYSICAL  EXAM:  VITAL SIGNS: ED Triage Vitals  Enc Vitals Group     BP 10/19/14 0955 124/88 mmHg     Pulse Rate 10/19/14 0955 101     Resp 10/19/14 0955 18     Temp 10/19/14 0955 97.9 F (36.6 C)     Temp Source 10/19/14 0955 Oral     SpO2 10/19/14 0955 100 %     Weight 10/19/14 0955 158 lb (71.668 kg)     Height 10/19/14 0955 5\' 2"  (1.575 m)     Head Cir --      Peak Flow --      Pain Score 10/19/14 1001 10     Pain Loc --      Pain Edu? --      Excl. in [place]? --   Initial vital signs reviewed she is afebrile and normotensive and slightly tachycardic.  Complain out of 7 out of 10 pain at the time of the exam Constitutional: Alert and oriented. Appears anxious and tearful. Eyes: Conjunctivae are normal. PERRL. Normal extraocular movements. ENT   Head: Normocephalic and atraumatic.   Nose: No congestion/rhinnorhea.   Mouth/Throat: Mucous membranes are dry   Neck: No stridor. Hematological/Lymphatic/Immunilogical: No cervical lymphadenopathy. Cardiovascular: Normal rate, regular rhythm. Normal and symmetric distal pulses are present in all extremities. No murmurs, rubs, or gallops. Respiratory: Normal respiratory effort without tachypnea nor retractions. Breath sounds are clear and equal bilaterally. No wheezes/rales/rhonchi. Gastrointestinal: Soft but distended and tender. . She is guarding but has no rebound. Bowel sounds are normal. No abdominal bruits. There is no CVA tenderness. Genitourinary: Deferred Musculoskeletal: Nontender with normal range of motion in all extremities. No joint effusions.  No lower extremity tenderness nor edema. Neurologic:  Normal speech and language. No gross focal neurologic deficits are appreciated. Speech is normal. No gait instability. Skin:  Skin is warm, dry and intact. No rash noted. She is not jaundiced Psychiatric: Mood and affect are normal. Speech and behavior are normal. Patient exhibits appropriate insight and  judgment.  ____________________________________________    LABS (pertinent positives/negatives)   The initial laboratory of tests in the emergency Department included a CBC with differential comprehensive medical panel and a lipase. The CBC is normal with no elevation of the WBC she is not anemic. They comprehensive medical panel is shows an elevated glucose of 212, a and a low albumin of 2.9 with a low calcium of 8.7 the albumin ReFlex her poor dietary intake and likely the calcium is a result of her low albumin. The lipase is only slightly elevated at 134.  ____________________________________________   EKG  12:51 PM ED ECG REPORT   Date: 10/19/2014  EKG Time: 12:51 PM  Rate: Normal sinus rhythm  Rhythm: normal EKG, normal sinus rhythm, unchanged from previous tracings, normal sinus rhythm, nonspecific ST and T  waves changes  Axis: Normal  Intervals:none  ST&T Change: Nonspecific ST-T wave changes   ____________________________________________    RADIOLOGY ----------------------------------------- 3:31 PM on 10/19/2014 -----------------------------------------   Just received a call from Dr. Lynita Lombard radiologist who completed the reading of the CT of abdomen and pelvis with contrast the finding on the CAT scan is a large mass in the pancreatic body which is obstructing the main pancreatic duct with significant dilatation of the main pancreatic duct and indistinct tenderness of the body and the tail of the pancreas probable pathologic lymph nodes anterior to the pancreas there is also thrombosis of the portal vein with extension into the intrahepatic portal vein complete thrombosis of splenic vein and superior thesuperior mesenteric vein tumor thrombosis cannot be excluded there is also some mild stranding of the peripancreatic fat of the fat surrounding the superior mesenteric artery this is thought to be due to edema mild pancreatitis or tumor  extension  ____________________________________________   PROCEDURES  Procedure(s) performed: None  Critical Care performed: No  ____________________________________________   INITIAL IMPRESSION / ASSESSMENT AND PLAN / ED COURSE  Pertinent labs & imaging results that were available during my care of the patient were reviewed by me and considered in my medical decision making (see chart for details).  Impression;  my initial impression is that although this patient has had chronic pancreatitis or L lipase is not grossly elevated today. She is hyperglycemic and slightly malnutrition from not eating. On physical exam her abdomen is distended and tender. Evaluation in the emergency Department will include labs and CT of abdomen to evaluate for additional etiology of her abdominal pain. She will receive IV fluid in the emergency department her glucose will be monitored, and she will receive IV anti-emetics and IV analgesics in the emergency department. ----------------------------------------- 3:32 PM on 10/19/2014 -----------------------------------------  The patient has had a reoccurrence of the pain she is having an additional dose of IV Zosyn and IV fentanyl were provided in the emergency department. The CAT scan results have been reviewed I spoke with Dr. Lynita Lombard the radiologist about the CAT scan results. I've called to consult for gastroenterology to determine what the best plan of course will be for this patient. Gastroenterologist is Dr. Rayann Heman .____________________________________________ ----------------------------------------- 4:22 PM on 10/19/2014 -----------------------------------------  Radiology report and findings noted above I spoke with Dr.Rein GI who will see the pt in consultation and he recommends admission for further evaluation and workup and pain control. I spoke with Dr. Berkley Harvey who recommends anticoagulation because of the thrombosis in the portal system. PT has not  been evaluated I've ordered a PT prior to the initiation of anticoagulation. I discussed this with the hospitalist Dr. Genia Harold she will initiate anticoagulation after the results of the PT have been ascertained.  I spoke with the patient's daughter was present in the emergency department. She is very emotional and crying and is upset about the fact that her mother has a large tumor of the pancreas on CT. Emotional support was provided to the family.   Patient continues to have pain in the emergency department despite 2 doses of intravenous analgesic. She will be admitted to the hospital for IV hydration pain control and further evaluation of the mass of her pancreas and thrombosis of the portal system  FINAL CLINICAL IMPRESSION(S) / ED DIAGNOSES  Final diagnoses:  Pancreatic mass  Portal vein thrombosis  Splenic vein thrombosis  Superior mesenteric artery thrombosis  Acute pancreatitis, unspecified pancreatitis type     Dezman Granda L  Benjaman Lobe, DO 10/19/14 [phone]

## 2014-10-19 NOTE — ED Notes (Signed)
Pt was at doctor yesterday and was found to have glucose in her urine, was put on Metformin, was told she has pancreatitis and was told to come to ED, had pancreatitis and admitted for pancreatitis 2 months ago, c/o abdominal pain and nausea

## 2014-10-19 NOTE — Plan of Care (Signed)
Problem: Discharge Progression Outcomes Goal: Discharge plan in place and appropriate Individualization of care Prefers to be called [name]. Lives at home with dgt; independent in ADL's.  history of chronic pancreatitis and newly diagnosed hypertension and diabetes-controlled by home meds. High risk for fall Goal: Other Discharge Outcomes/Goals Plan of Care Progress to Goal:  New admission: Demerol given for pain with pain decreasing from 8 to 7/10 in midabdomen. Actively vomiting x 3 during admission of green bile liquid with zofran given. Pt currently resting quietly with eyes closed. Heparin bolus given with drip infusing.

## 2014-10-19 NOTE — Progress Notes (Signed)
ANTICOAGULATION CONSULT NOTE - Initial Consult  Pharmacy Consult for Heparin dosing Indication: Portal Vein Thrombosis   Allergies  Allergen Reactions  . Aspirin Other (See Comments)    Stomach problems    Patient Measurements: Height: 5\' 2"  (157.5 cm) Weight: 158 lb 11.2 oz (71.986 kg) IBW/kg (Calculated) : 50.1 Heparin Dosing Weight: 65.3 kg  Vital Signs: Temp: 98.2 F (36.8 C) (05/03 1741) Temp Source: Oral (05/03 1741) BP: 168/84 mmHg (05/03 1819) Pulse Rate: 97 (05/03 1741)  Labs:  Recent Labs  10/19/14 1012 10/19/14 1656  HGB 12.1  --   HCT 37.3  --   PLT 426  --   LABPROT  --  13.5  INR  --  1.01  CREATININE 0.50  --     Estimated Creatinine Clearance: 49.5 mL/min (by C-G formula based on Cr of 0.5).   Medical History: Past Medical History  Diagnosis Date  . Pancreatitis   . Hepatitis C   . Femur fracture   . H/O: hysterectomy   . History of cholecystectomy   . Hx of appendectomy     Medications:   Assessment:  Deep Vein Thrombosis   Goal of Therapy:  Heparin level 0.3-0.7 units/ml Monitor platelets by anticoagulation protocol: Yes   Plan:  Give 3900 units bolus x 1 Start heparin infusion at 780 units/hr  Bay Jarquin D 10/19/2014,7:09 PM

## 2014-10-20 DIAGNOSIS — I10 Essential (primary) hypertension: Secondary | ICD-10-CM

## 2014-10-20 DIAGNOSIS — I81 Portal vein thrombosis: Secondary | ICD-10-CM

## 2014-10-20 DIAGNOSIS — Z515 Encounter for palliative care: Secondary | ICD-10-CM

## 2014-10-20 DIAGNOSIS — R112 Nausea with vomiting, unspecified: Secondary | ICD-10-CM

## 2014-10-20 DIAGNOSIS — K859 Acute pancreatitis without necrosis or infection, unspecified: Secondary | ICD-10-CM | POA: Insufficient documentation

## 2014-10-20 DIAGNOSIS — M818 Other osteoporosis without current pathological fracture: Secondary | ICD-10-CM

## 2014-10-20 DIAGNOSIS — E119 Type 2 diabetes mellitus without complications: Secondary | ICD-10-CM

## 2014-10-20 DIAGNOSIS — K861 Other chronic pancreatitis: Secondary | ICD-10-CM

## 2014-10-20 DIAGNOSIS — K5900 Constipation, unspecified: Secondary | ICD-10-CM

## 2014-10-20 DIAGNOSIS — R1013 Epigastric pain: Secondary | ICD-10-CM

## 2014-10-20 LAB — GLUCOSE, CAPILLARY
GLUCOSE-CAPILLARY: 231 mg/dL — AB (ref 70–99)
GLUCOSE-CAPILLARY: 171 mg/dL — AB (ref 70–99)
GLUCOSE-CAPILLARY: 149 mg/dL — AB (ref 70–99)
Glucose-Capillary: 260 mg/dL — ABNORMAL HIGH (ref 70–99)

## 2014-10-20 LAB — HEPARIN LEVEL (UNFRACTIONATED)
HEPARIN UNFRACTIONATED: 0.14 [IU]/mL — AB (ref 0.30–0.70)
HEPARIN UNFRACTIONATED: 0.75 [IU]/mL — AB (ref 0.30–0.70)

## 2014-10-20 LAB — CARDIOLIPIN ANTIBODIES, IGG, IGM, IGA
ANTICARDIOLIPIN IGM: 10 [MPL'U]/mL (ref 0–12)
Anticardiolipin IgA: <9 APL U/mL (ref 0–11)
Anticardiolipin IgG: <9 GPL U/mL (ref 0–14)

## 2014-10-20 LAB — LUPUS ANTICOAGULANT PANEL
DRVVT: 29 s (ref 0.0–55.1)
PTT Lupus Anticoagulant: 40.9 s (ref 0.0–50.0)

## 2014-10-20 LAB — PROTEIN C ACTIVITY: PROTEIN C ACTIVITY: 95 % (ref 74–151)

## 2014-10-20 LAB — PROTEIN C, TOTAL: PROTEIN C, TOTAL: 52 % — AB (ref 70–140)

## 2014-10-20 LAB — PROTEIN S ACTIVITY: Protein S Activity: 93 % (ref 60–145)

## 2014-10-20 LAB — PROTEIN S, TOTAL: Protein S Ag, Total: 133 % (ref 58–150)

## 2014-10-20 LAB — HOMOCYSTEINE: HOMOCYSTEINE-NORM: 8 umol/L (ref 0.0–15.0)

## 2014-10-20 MED ORDER — HEPARIN (PORCINE) IN NACL 100-0.45 UNIT/ML-% IJ SOLN
1000.0000 [IU]/h | INTRAMUSCULAR | Status: DC
  Administered 2014-10-20: 16:00:00 1050 [IU]/h via INTRAVENOUS
  Administered 2014-10-21 – 2014-10-23 (×2): 1000 [IU]/h via INTRAVENOUS
  Filled 2014-10-20 (×6): qty 250

## 2014-10-20 MED ORDER — HYDROMORPHONE HCL 1 MG/ML IJ SOLN: 1.0000 mg | INTRAMUSCULAR | Status: DC | PRN

## 2014-10-20 MED ORDER — INSULIN ASPART 100 UNIT/ML ~~LOC~~ SOLN
0.0000 [IU] | Freq: Three times a day (TID) | SUBCUTANEOUS | Status: DC
Start: 2014-10-20 — End: 2014-10-25

## 2014-10-20 MED ORDER — ALUM & MAG HYDROXIDE-SIMETH 200-200-20 MG/5ML PO SUSP: 30.0000 mL | Freq: Four times a day (QID) | ORAL | Status: DC | PRN

## 2014-10-20 MED ORDER — SENNOSIDES-DOCUSATE SODIUM 8.6-50 MG PO TABS: 1.0000 | ORAL_TABLET | Freq: Every evening | ORAL | Status: DC | PRN

## 2014-10-20 MED ORDER — PROCHLORPERAZINE EDISYLATE 5 MG/ML IJ SOLN
10.0000 mg | Freq: Four times a day (QID) | INTRAMUSCULAR | Status: DC | PRN
  Administered 2014-10-20 – 2014-10-24 (×3): 10 mg via INTRAVENOUS
  Filled 2014-10-20 (×4): qty 2

## 2014-10-20 MED ORDER — HEPARIN BOLUS VIA INFUSION
2000.0000 [IU] | Freq: Once | INTRAVENOUS | Status: AC
  Administered 2014-10-20: 2000 [IU] via INTRAVENOUS
  Filled 2014-10-20: qty 2000

## 2014-10-20 MED ORDER — HEPARIN (PORCINE) IN NACL 100-0.45 UNIT/ML-% IJ SOLN: 1050.0000 [IU]/h | INTRAMUSCULAR | Status: DC

## 2014-10-20 MED ORDER — PROCHLORPERAZINE EDISYLATE 5 MG/ML IJ SOLN: 10.0000 mg | Freq: Four times a day (QID) | INTRAMUSCULAR | Status: DC | PRN

## 2014-10-20 MED ORDER — PANCRELIPASE (LIP-PROT-AMYL) 12000-38000 UNITS PO CPEP: 12000.0000 [IU] | ORAL_CAPSULE | Freq: Three times a day (TID) | ORAL | Status: DC | PRN

## 2014-10-20 MED ORDER — ACETAMINOPHEN 325 MG PO TABS: 650.0000 mg | ORAL_TABLET | Freq: Four times a day (QID) | ORAL | Status: DC | PRN

## 2014-10-20 NOTE — Progress Notes (Signed)
Dr. Marcille Blanco called regarding cardiac monitoring order. Patient has no cardiac history besides HTN, inquired if telemetry order is necessary. MD stated okay to discontinue order. Patient still vomitting, ordered a one time order of 4 mg Zofran IV push.

## 2014-10-20 NOTE — Progress Notes (Signed)
ANTICOAGULATION CONSULT NOTE - Follow Up Consult  Pharmacy Consult for Heparin Indication: portal vein thrombosis  Allergies  Allergen Reactions  . Aspirin Other (See Comments)    Stomach problems    Patient Measurements: Height: 5\' 2"  (157.5 cm) Weight: 158 lb 11.2 oz (71.986 kg) IBW/kg (Calculated) : 50.1 Heparin Dosing Weight: 65  Vital Signs: Temp: 97.8 F (36.6 C) (05/04 1438) Temp Source: Oral (05/04 1438) BP: 149/63 mmHg (05/04 1438) Pulse Rate: 100 (05/04 1438)  Labs:  Recent Labs  10/19/14 1012 10/19/14 1656 10/19/14 1815 10/20/14 0419 10/20/14 1444  HGB 12.1  --  11.1*  --   --   HCT 37.3  --  33.3*  --   --   PLT 426  --  365  --   --   LABPROT  --  13.5  --   --   --   INR  --  1.01  --   --   --   HEPARINUNFRC  --   --   --  0.14* 0.75*  CREATININE 0.50  --  0.33*  --   --     Estimated Creatinine Clearance: 49.5 mL/min (by C-G formula based on Cr of 0.33).   Medications:  Infusions:  . heparin 1,050 Units/hr (10/20/14 1556)    Assessment: Heparin level is slighly above goal.  Goal of Therapy:  Heparin level 0.3-0.7 units/ml Monitor platelets by anticoagulation protocol: Yes   Plan:  Will decrease infusion to 1000 units/hr and recheck anti-Xa in 8 h.   Ulice Dash D 10/20/2014,4:07 PM

## 2014-10-20 NOTE — Plan of Care (Signed)
Problem: Discharge Progression Outcomes Goal: Other Discharge Outcomes/Goals Outcome: Progressing Plan of Care progress to goal:  Pt c/o of both severe pain and nausea today.  Relieved by compazine and dilaudid only 1x. Pt has very poor PO intake. Will be unable to do biopsy here - Pt on waiting list for [facility].  Pt is afraid to eat now b/c she throws up everything she eats.

## 2014-10-20 NOTE — Progress Notes (Signed)
ANTICOAGULATION CONSULT NOTE - Follow Up Consult  Pharmacy Consult for heparin Indication: portal vein thrombosis  Allergies  Allergen Reactions  . Aspirin Other (See Comments)    Stomach problems    Patient Measurements: Height: 5\' 2"  (157.5 cm) Weight: 158 lb 11.2 oz (71.986 kg) IBW/kg (Calculated) : 50.1 Heparin Dosing Weight: 65.4  Vital Signs: Temp: 97.5 F (36.4 C) (05/04 0600) Temp Source: Oral (05/04 0600) BP: 127/93 mmHg (05/04 0600) Pulse Rate: 101 (05/04 0600)  Labs:  Recent Labs  10/19/14 1012 10/19/14 1656 10/19/14 1815 10/20/14 0419  HGB 12.1  --  11.1*  --   HCT 37.3  --  33.3*  --   PLT 426  --  365  --   LABPROT  --  13.5  --   --   INR  --  1.01  --   --   HEPARINUNFRC  --   --   --  0.14*  CREATININE 0.50  --  0.33*  --     Estimated Creatinine Clearance: 49.5 mL/min (by C-G formula based on Cr of 0.33).   Medications:  Infusions:  . heparin 1,050 Units/hr (10/20/14 0729)    Assessment: Portal vein thrombosis Goal of Therapy:  Heparin level 0.3-0.7 units/ml Monitor platelets by anticoagulation protocol: Yes   Plan:  HL below goal this am so bolused 2000 units heparin and increased drip to 1050 units/hr with next anti-Xa due at 1500.   Ulice Dash D 10/20/2014,12:01 PM

## 2014-10-20 NOTE — Plan of Care (Signed)
Problem: Discharge Progression Outcomes Goal: Discharge plan in place and appropriate Individualization of care  Patient is a High Fall Risk Goes by [facility] From home, lives with daughter. Independent at home. Hx of chronic pancreatitis, newly diagnoses HTN and diabetes, controlled by hoem medications  Patient is originally from [place] Goal: Other Discharge Outcomes/Goals Outcome: Progressing Patient vomited multiple times, Zofran given x2 with relief. NO c/o of pain. 1 assist to the [facility]. Patient is calm, cooperative. Heparin drip continues. PO intake encouraged as tolerated.

## 2014-10-20 NOTE — Consult Note (Signed)
[facility] Consult Note  MRN : [id number]  [name] is a 81 y.o. (09-22-1928) female who presents with chief complaint of  Chief Complaint  Patient presents with  . Nausea  .  History of Present Illness: The patient is a 81 y.o. female with a known history of chronic pancreatitis and newly diagnosed hypertension and diabetes who presents with abdominal pain for the past several months. Patient was admitted in February at that time she was diagnosed with chronic pancreatitis. She states, her abdominal pain never quite resolved and she has been seen multiple occasions by her primary care for further evaluations. Recently, she was recently treated for urinary tract infection. Over the past 2 days her abdominal pain has worsened so she came to the ER for further evaluation. CAT scan was performed which showed a pancreatic mass associated with portal thrombosis. I was contacted and recommended initiating a heparin drip.  Current Facility-Administered Medications  Medication Dose Route Frequency Provider Last Rate Last Dose  . acetaminophen (TYLENOL) tablet 650 mg  650 mg Oral Q6H PRN Bettey Costa, MD       Or  . acetaminophen (TYLENOL) suppository 650 mg  650 mg Rectal Q6H PRN Bettey Costa, MD      . alum & mag hydroxide-simeth (MAALOX/MYLANTA) 200-200-20 MG/5ML suspension 30 mL  30 mL Oral Q6H PRN Sital Mody, MD      . amLODipine (NORVASC) tablet 5 mg  5 mg Oral Daily Bettey Costa, MD   5 mg at 10/19/14 1819  . docusate sodium (COLACE) capsule 100 mg  100 mg Oral BID Bettey Costa, MD   100 mg at 10/20/14 1426  . heparin ADULT infusion 100 units/mL (25000 units/250 mL)  1,050 Units/hr Intravenous Continuous Sital Mody, MD 10.5 mL/hr at 10/20/14 0729 1,050 Units/hr at 10/20/14 0729  . HYDROmorphone (DILAUDID) injection 1 mg  1 mg Intravenous Q4H PRN Bettey Costa, MD   1 mg at 10/20/14 1136  . insulin aspart (novoLOG) injection 0-9 Units  0-9 Units Subcutaneous TID WC  Bettey Costa, MD   3 Units at 10/20/14 1425  . lipase/protease/amylase (CREON) capsule 12,000 Units  12,000 Units Oral TID BM PRN Bettey Costa, MD   12,000 Units at 10/20/14 1425  . lipase/protease/amylase (CREON) capsule 24,000 Units  24,000 Units Oral TID AC Bettey Costa, MD   24,000 Units at 10/19/14 1819  . prochlorperazine (COMPAZINE) injection 10 mg  10 mg Intravenous Q6H PRN [place] Jack Phifer, MD   10 mg at 10/20/14 1127  . senna-docusate (Senokot-S) tablet 1 tablet  1 tablet Oral QHS PRN Bettey Costa, MD        Past Medical History  Diagnosis Date  . Pancreatitis   . Hepatitis C   . Femur fracture   . H/O: hysterectomy   . History of cholecystectomy   . Hx of appendectomy     Past Surgical History  Procedure Laterality Date  . Total hip arthroplasty    . Abdominal hysterectomy    . Appendectomy    . Cholecystectomy      Social History History  Substance Use Topics  . Smoking status: Never Smoker   . Smokeless tobacco: Never Used  . Alcohol Use: No    Family History There is no family history of autoimmune disease or hypercoagulability  Allergies  Allergen Reactions  . Aspirin Other (See Comments)    Stomach problems     REVIEW OF SYSTEMS (Negative unless checked)  Constitutional: [] Weight loss  []   Fever  [] Chills Cardiac: [] Chest pain   [] Chest pressure   [] Palpitations   [] Shortness of breath when laying flat   [] Shortness of breath at rest   [] Shortness of breath with exertion. Vascular:  [] Pain in legs with walking   [] Pain in legs at rest   [] Pain in legs when laying flat   [] Claudication   [] Pain in feet when walking  [] Pain in feet at rest  [] Pain in feet when laying flat   [] History of DVT   [] Phlebitis   [] Swelling in legs   [] Varicose veins   [] Non-healing ulcers Pulmonary:   [] Uses home oxygen   [] Productive cough   [] Hemoptysis   [] Wheeze  [] COPD   [] Asthma Neurologic:  [] Dizziness  [] Blackouts   [] Seizures   [] History of stroke   [] History of TIA  [] Aphasia    [] Temporary blindness   [] Dysphagia   [] Weakness or numbness in arms   [] Weakness or numbness in legs Musculoskeletal:  [] Arthritis   [] Joint swelling   [] Joint pain   [] Low back pain Hematologic:  [] Easy bruising  [] Easy bleeding   [] Hypercoagulable state   [] Anemic  [] Hepatitis Gastrointestinal:  [] Blood in stool   [] Vomiting blood  [] Gastroesophageal reflux/heartburn   [] Difficulty swallowing. Genitourinary:  [] Chronic kidney disease   [] Difficult urination  [] Frequent urination  [] Burning with urination   [] Blood in urine Skin:  [] Rashes   [] Ulcers   [] Wounds Psychological:  [] History of anxiety   []  History of major depression.    Physical Examination  Filed Vitals:   10/19/14 1819 10/19/14 2127 10/20/14 0600 10/20/14 1438  BP: 168/84 155/62 127/93 149/63  Pulse:  90 101 100  Temp:  98.4 F (36.9 C) 97.5 F (36.4 C) 97.8 F (36.6 C)  TempSrc:  Oral Oral Oral  Resp:  19 20   Height:      Weight:      SpO2:  94% 92% 87%   Body mass index is 29.02 kg/(m^2).  Head: [place]/AT, No temporalis wasting. Prominent temp pulse not noted. Ear/Nose/Throat: Hearing grossly intact, nares w/o erythema or drainage, oropharynx w/o Erythema/Exudate,   Dentition good, mucous membranes dry.  Eyes: PERRLA, EOMI.  Neck: Supple, no nuchal rigidity.  No bruit or JVD.  Pulmonary:  Good air movement, clear to auscultation bilaterally.  Cardiac: RRR, normal S1, S2, no Murmurs, rubs or gallops. Vascular:  Vessel Right Left  Radial Palpable Palpable  Ulnar Palpable Palpable  Brachial Palpable Palpable  Carotid Palpable, without bruit Palpable, without bruit  Aorta Not palpable N/A  Femoral Palpable Palpable  Popliteal Palpable Palpable  PT Palpable Palpable  DP Palpable Palpable   Gastrointestinal: soft, non-tender/non-distended. No guarding/reflex. No masses, surgical incisions, or scars. Musculoskeletal: M/S 5/5 throughout.  Extremities without ischemic changes.  No deformity or atrophy. No  edema. Neurologic: CN 2-12 intact. Pain and light touch intact in extremities.  Symmetrical.  Speech is fluent. Motor exam as listed above. Psychiatric: Judgment intact, Mood & affect appropriate for pt's clinical situation. Dermatologic: No rashes or ulcers noted.  No cellulitis or open wounds. Lymph : No Cervical, Axillary, or Inguinal lymphadenopathy.   CBC Lab Results  Component Value Date   WBC 8.2 10/19/2014   HGB 11.1* 10/19/2014   HCT 33.3* 10/19/2014   MCV 94.1 10/19/2014   PLT 365 10/19/2014    BMET    Component Value Date/Time   NA 141 10/19/2014 1012   K 4.0 10/19/2014 1012   CL 103 10/19/2014 1012   CO2 25 10/19/2014 1012  GLUCOSE 212* 10/19/2014 1012   BUN 19 10/19/2014 1012   CREATININE 0.33* 10/19/2014 1815   CALCIUM 8.7* 10/19/2014 1012   GFRNONAA >60 10/19/2014 1815   GFRAA >60 10/19/2014 1815   Estimated Creatinine Clearance: 49.5 mL/min (by C-G formula based on Cr of 0.33).  COAG Lab Results  Component Value Date   INR 1.01 10/19/2014    Radiology CT scan demonstrates a pancreatic mass associated with thrombosis within the superior mesenteric splenic and distal portal veins   Assessment/Plan 1. 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 Patient understands these risks and benefits and except these risk, heparin drip will be started. A hypercoagulable panel has been ordered.  There is no role for surgical intervention with mesenteric venous thrombosis. Given her pancreatic mass in association with this thrombotic event her prognosis is quite poor.  2. Pancreatic mass: Patient will require GI consultation she will possibly need an ERCP or a biopsy by radiology. Given the appearance by CT this is likely a neoplastic process. 3. Abdominal pain: Likely secondary to a mass in pancreatic body obstructing the main pancreatic duct along with thrombosis of splenic vein  and superior mesenteric vein and portal vein. She will need pain medications and supportive care  4. Newly diagnosed hypertension: Patient will continue Norvasc 5.Newly diagnosed diabetes: Patient will be placed on sliding scale insulin. 6. Chronic pancreatitis: I will continue Creon. GI has been consulted.   Schnier, Dolores Lory, MD  10/20/2014 3:42 PM

## 2014-10-20 NOTE — Progress Notes (Signed)
Patient ID: [name], female   DOB: [birth year], 81 y.o.   MRN: [id number] [facility] PROGRESS NOTE  DOA: 10/19/2014 PCP: Halina Maidens, MD  HPI/Subjective:    The patient is having nausea and vomiting and 7 out of 10 abdominal pain. The pain has been going on for a few weeks. She feels so miserable she can hardly talk.  Objective: Filed Vitals:   10/20/14 0600  BP: 127/93  Pulse: 101  Temp: 97.5 F (36.4 C)  Resp: 20    Intake/Output Summary (Last 24 hours) at 10/20/14 1328 Last data filed at 10/20/14 0800  Gross per 24 hour  Intake      0 ml  Output    600 ml  Net   -600 ml   Filed Weights   10/19/14 0955 10/19/14 1741  Weight: 71.668 kg (158 lb) 71.986 kg (158 lb 11.2 oz)    ROS: Review of Systems  Constitutional: Negative for fever and chills.  Eyes: Negative for blurred vision.  Respiratory: Negative for cough and shortness of breath.   Cardiovascular: Negative for chest pain.  Gastrointestinal: Positive for nausea, vomiting and abdominal pain. Negative for diarrhea and constipation.  Genitourinary: Negative for dysuria.  Musculoskeletal: Negative for joint pain.  Neurological: Negative for dizziness and headaches.   Exam: Physical Exam  HENT:  Nose: No mucosal edema.  Mouth/Throat: No oropharyngeal exudate or posterior oropharyngeal edema.  Eyes: Conjunctivae, EOM and lids are normal. Pupils are equal, round, and reactive to light.  Neck: No JVD present. Carotid bruit is not present. No edema present. No thyroid mass and no thyromegaly present.  Cardiovascular: S1 normal and S2 normal.  Exam reveals no gallop.   No murmur heard. Pulses:      Dorsalis pedis pulses are 2+ on the right side, and 2+ on the left side.  Respiratory: No respiratory distress. She has no wheezes. She has no rhonchi. She has no rales.  GI: Soft. Bowel sounds are normal. There is no hepatosplenomegaly. There is generalized tenderness. There is no CVA tenderness.   Lymphadenopathy:    She has no cervical adenopathy.  Neurological: She is alert. No cranial nerve deficit.  Skin: Skin is warm. No rash noted. Nails show no clubbing.  Psychiatric: She has a normal mood and affect.     Data Reviewed: Basic Metabolic Panel:  Recent Labs Lab 10/19/14 1012 10/19/14 1815  NA 141  --   K 4.0  --   CL 103  --   CO2 25  --   GLUCOSE 212*  --   BUN 19  --   CREATININE 0.50 0.33*  CALCIUM 8.7*  --    Liver Function Tests:  Recent Labs Lab 10/19/14 1012  AST 25  ALT 17  ALKPHOS 84  BILITOT 1.1  PROT 7.2  ALBUMIN 2.9*    Recent Labs Lab 10/19/14 1012  LIPASE 134*   CBC:  Recent Labs Lab 10/19/14 1012 10/19/14 1815  WBC 8.6 8.2  NEUTROABS 7.0*  --   HGB 12.1 11.1*  HCT 37.3 33.3*  MCV 95.4 94.1  PLT 426 365     CBG:  Recent Labs Lab 10/19/14 1905 10/19/14 2052 10/20/14 0738 10/20/14 1117  GLUCAP 191* 191* 260* 231*    No results found for this or any previous visit (from the past 240 hour(s)).   Studies: Ct Abdomen Pelvis W Contrast  10/19/2014   CLINICAL DATA:  Lower abdominal pain for 1 week  EXAM: CT ABDOMEN AND  PELVIS WITH CONTRAST  TECHNIQUE: Multidetector CT imaging of the abdomen and pelvis was performed using the standard protocol following bolus administration of intravenous contrast.  CONTRAST:  164mL OMNIPAQUE IOHEXOL 300 MG/ML  SOLN  COMPARISON:  08/08/2014 and CT report 07/31/1999  FINDINGS: The lung bases shows atelectasis in left base anterolaterally. A large hiatal hernia is noted measures 7 cm. Heart size within normal limits. Sagittal images of the spine shows degenerative changes and diffuse osteopenia thoracolumbar spine.  There is no focal hepatic mass. The patient is status postcholecystectomy. Mild intrahepatic biliary ductal dilatation. There is CBD dilatation up to 1.1 cm. There is a mass in the pancreatic body axial image 26 measures at least 1.8 cm. The mass is obstructing the main pancreatic  duct. The pancreatic body and tail are not identified probable significant atrophic or tumor replaced. There is significant dilatation of main pancreatic duct up to 1.2 cm.  There is complete thrombosis or tumor thrombus in portal vein extending in superior mesenteric vein and proximal inferior mesenteric vein. There is complete thrombosis of the splenic pain. Collateral veins are noted in splenic hilum and just anterior to left kidney. Probable pathologic lymph nodes or tumor extension just anterior to pancreas measures 1.2 x 1.1 cm.  Extensive atherosclerotic calcifications of abdominal aorta and iliac arteries. There is stranding of mesenteric fat surrounding the pancreas and SMA. This may be due to edema or mild pancreatitis. Tumor extension cannot be excluded.  Kidneys are symmetrical in size and enhancement. No hydronephrosis or hydroureter. Bilateral splenic vein is patent. The celiac trunk and SMA appears patent.  There is no pericecal inflammation. The terminal ileum is unremarkable. No small bowel obstruction. There are metallic artifacts from left hip prosthesis. Metallic wires are noted in proximal left femur.  Delayed renal images shows bilateral renal symmetrical excretion. Bilateral visualized proximal ureter is unremarkable.  IMPRESSION: 1. There is mild intrahepatic and extrahepatic biliary ductal dilatation. There is a mass in pancreatic body obstructing the main pancreatic duct. Significant dilatation of main pancreatic duct with indistinctness of the body and tail of the pancreas. Probable adjacent pathologic lymph nodes anterior to the pancreas. 2. There is complete thrombosis of portal vein with extension in intrahepatic portal vein. There is complete thrombosis of splenic vein and superior mesenteric vein. Tumor thrombus cannot be excluded. Some collateral vessels are noted in splenic hilum and anterior to left kidney. There is mild stranding of peripancreatic fat and fat surrounding the  superior mesenteric artery. This may be due to edema mild pancreatitis or tumor extension. 3. Atherosclerotic calcifications of abdominal aorta and iliac arteries. No aortic aneurysm. 4. No small bowel obstruction. 5. Moderate size hiatal hernia measures 7 cm. 6. Osteopenia and degenerative changes thoracolumbar spine. These results were called by telephone at the time of interpretation on 10/19/2014 at 3:20 pm to Dr. Ferman Hamming , who verbally acknowledged these results.   Electronically Signed   By: Lahoma Crocker M.D.   On: 10/19/2014 15:21    Scheduled Meds: . amLODipine  5 mg Oral Daily  . docusate sodium  100 mg Oral BID  . insulin aspart  0-9 Units Subcutaneous TID WC  . lipase/protease/amylase  24,000 Units Oral TID AC   Continuous Infusions: . heparin 1,050 Units/hr (10/20/14 0729)    Assessment/Plan: Active Problems:   Pancreatic mass   1. Nausea vomiting and abdominal pain.  -This could be secondary to a pancreatic mass or a splenic thromboembolism. -Supportive care with IV fluid hydration and  nausea medications and pain medications. 2.  Pancreatic mass which is likely a pancreatic cancer. -I spoke to Dr. Rayann Heman gastroenterology and he stated that this is not amenable to CT-guided biopsy. We do not offer endoscopic ultrasound biopsy here at this hospital. Biopsy will have to be done as an outpatient. Appreciate palliative care consultation. Awaiting oncology consultation. 3. Splenic thromboembolism -Currently on heparin drip. Unclear if we should anticoagulate this patient, especially if a biopsy is going to be done as outpatient. 4. Essential hypertension - continue Norvasc if able to tolerate. 5. Type 2 diabetes controlled - sliding scale for right now.  Code Status:     Code Status Orders        Start     Ordered   10/19/14 6256  Do not attempt resuscitation (DNR)   Continuous    Question Answer Comment  In the event of cardiac or respiratory ARREST Do not call a "code  blue"   In the event of cardiac or respiratory ARREST Do not perform Intubation, CPR, defibrillation or ACLS   In the event of cardiac or respiratory ARREST Use medication by any route, position, wound care, and other measures to relive pain and suffering. May use oxygen, suction and manual treatment of airway obstruction as needed for comfort.   Comments nurse may pronouce      10/19/14 1749     Disposition Plan: Possibly home Time spent: 25 minutes  Loletha Grayer  [facility]

## 2014-10-20 NOTE — Consult Note (Signed)
GI Inpatient Consult Note  Reason for Consult:  pancreatic mass,  abd pain, n/v.    Attending Requesting Consult: Earleen Newport  History of Present Illness: [name] is a 81 y.o. female with PMHX notable for HepC, pancreatitis preseting for several weeks of epigastric and diffuse abd pain, n/v. Pain worse over past few days and prompted visit to ED.  Doing well prior to 3 or 4 weeks ago.  Denies wt loss, rectal bleeding, melena, f/c.   CT in ED shows pancreatic mass along with  PVT, SMV thrombus splenic thrombus.    Currently, continues to have significant trouble with n/v and ongoing abd pain.  Asking for more pain meds and anti-nausea medcs.   Recent hospitalization at [facility] about 6 months ago for acute pancreatitis. No imaging done at taht time.   She denies any hx of CT.   Past Medical History:  Past Medical History  Diagnosis Date  . Pancreatitis   . Hepatitis C   . Femur fracture   . H/O: hysterectomy   . History of cholecystectomy   . Hx of appendectomy     Problem List: Patient Active Problem List   Diagnosis Date Noted  . Pancreatic mass 10/19/2014    Past Surgical History: Past Surgical History  Procedure Laterality Date  . Total hip arthroplasty    . Abdominal hysterectomy    . Appendectomy    . Cholecystectomy      Allergies: Allergies  Allergen Reactions  . Aspirin Other (See Comments)    Stomach problems    Home Medications: Prescriptions prior to admission  Medication Sig Dispense Refill Last Dose  . amLODipine (NORVASC) 5 MG tablet Take 1 tablet by mouth daily. Take one tablet daily   10/19/2014 at Unknown time  . CREON 6000 UNITS CPEP Take 3 capsules by mouth 3 (three) times daily before meals. Patient may take 1 capsule before snack in between meals   10/19/2014 at Unknown time  . docusate sodium (COLACE) 100 MG capsule Take 100 mg by mouth every other day.    10/18/2014 at Unknown time  . metFORMIN (GLUCOPHAGE) 500 MG tablet Take 1 tablet by mouth  daily.   10/18/2014 at Unknown time  . ondansetron (ZOFRAN-ODT) 8 MG disintegrating tablet Take 8 mg by mouth every 8 (eight) hours as needed.    10/18/2014 at Unknown time   Home medication reconciliation was completed with the patient.   Scheduled Inpatient Medications:   . amLODipine  5 mg Oral Daily  . docusate sodium  100 mg Oral BID  . insulin aspart  0-9 Units Subcutaneous TID WC  . lipase/protease/amylase  24,000 Units Oral TID AC    Continuous Inpatient Infusions:   . heparin 1,050 Units/hr (10/20/14 0729)    PRN Inpatient Medications:  acetaminophen **OR** acetaminophen, alum & mag hydroxide-simeth, HYDROmorphone (DILAUDID) injection, lipase/protease/amylase, prochlorperazine, senna-docusate  Family History: The patient's family history is negative for inflammatory bowel disorders, GI malignancy, or solid organ transplantation.  Social History:   reports that she has never smoked. She has never used smokeless tobacco. She reports that she does not drink alcohol or use illicit drugs.   Review of Systems: Constitutional: no wt loss Eyes: No changes in vision. ENT: No oral lesions, sore throat.  GI: see HPI.  Heme/Lymph: No easy bruising.  CV: No chest pain.  GU: No hematuria.  Integumentary: No rashes.  Neuro: No headaches.  Psych: No depression/anxiety.  Endocrine: No heat/cold intolerance.  Allergic/Immunologic: No urticaria.  Resp:  No cough, SOB.  Musculoskeletal: No joint swelling.    Physical Examination: BP 127/93 mmHg  Pulse 101  Temp(Src) 97.5 F (36.4 C) (Oral)  Resp 20  Ht 5\' 2"  (1.575 m)  Wt 158 lb 11.2 oz (71.986 kg)  BMI 29.02 kg/m2  SpO2 92% Gen:, alert and oriented x 4, some distress vomiting.  HEENT: PEERLA, EOMI, Neck: supple, no JVD or thyromegaly Chest: CTA bilaterally, no wheezes, crackles, or other adventitious sounds CV: RRR, no m/g/c/r Abd: + diffuse TTP, +BS in all four quadrants; no HSM, guarding, ridigity, or rebound  tenderness Ext: no edema, well perfused with 2+ pulses, Skin: no rash or lesions noted Lymph: no LAD  Data: Lab Results  Component Value Date   WBC 8.2 10/19/2014   HGB 11.1* 10/19/2014   HCT 33.3* 10/19/2014   MCV 94.1 10/19/2014   PLT 365 10/19/2014    Recent Labs Lab 10/19/14 1012 10/19/14 1815  HGB 12.1 11.1*   Lab Results  Component Value Date   NA 141 10/19/2014   K 4.0 10/19/2014   CL 103 10/19/2014   CO2 25 10/19/2014   BUN 19 10/19/2014   CREATININE 0.33* 10/19/2014   Lab Results  Component Value Date   ALT 17 10/19/2014   AST 25 10/19/2014   ALKPHOS 84 10/19/2014   BILITOT 1.1 10/19/2014    Recent Labs Lab 10/19/14 1656  INR 1.01    CLINICAL DATA: Lower abdominal pain for 1 week  EXAM: CT ABDOMEN AND PELVIS WITH CONTRAST  TECHNIQUE: Multidetector CT imaging of the abdomen and pelvis was performed using the standard protocol following bolus administration of intravenous contrast.  CONTRAST: 172mL OMNIPAQUE IOHEXOL 300 MG/ML SOLN  COMPARISON: 08/08/2014 and CT report 07/31/1999  FINDINGS: The lung bases shows atelectasis in left base anterolaterally. A large hiatal hernia is noted measures 7 cm. Heart size within normal limits. Sagittal images of the spine shows degenerative changes and diffuse osteopenia thoracolumbar spine.  There is no focal hepatic mass. The patient is status postcholecystectomy. Mild intrahepatic biliary ductal dilatation. There is CBD dilatation up to 1.1 cm. There is a mass in the pancreatic body axial image 26 measures at least 1.8 cm. The mass is obstructing the main pancreatic duct. The pancreatic body and tail are not identified probable significant atrophic or tumor replaced. There is significant dilatation of main pancreatic duct up to 1.2 cm.  There is complete thrombosis or tumor thrombus in portal vein extending in superior mesenteric vein and proximal inferior mesenteric vein. There is  complete thrombosis of the splenic pain. Collateral veins are noted in splenic hilum and just anterior to left kidney. Probable pathologic lymph nodes or tumor extension just anterior to pancreas measures 1.2 x 1.1 cm.  Extensive atherosclerotic calcifications of abdominal aorta and iliac arteries. There is stranding of mesenteric fat surrounding the pancreas and SMA. This may be due to edema or mild pancreatitis. Tumor extension cannot be excluded.  Kidneys are symmetrical in size and enhancement. No hydronephrosis or hydroureter. Bilateral splenic vein is patent. The celiac trunk and SMA appears patent.  There is no pericecal inflammation. The terminal ileum is unremarkable. No small bowel obstruction. There are metallic artifacts from left hip prosthesis. Metallic wires are noted in proximal left femur.  Delayed renal images shows bilateral renal symmetrical excretion. Bilateral visualized proximal ureter is unremarkable.  IMPRESSION: 1. There is mild intrahepatic and extrahepatic biliary ductal dilatation. There is a mass in pancreatic body obstructing the main pancreatic duct. Significant dilatation  of main pancreatic duct with indistinctness of the body and tail of the pancreas. Probable adjacent pathologic lymph nodes anterior to the pancreas. 2. There is complete thrombosis of portal vein with extension in intrahepatic portal vein. There is complete thrombosis of splenic vein and superior mesenteric vein. Tumor thrombus cannot be excluded. Some collateral vessels are noted in splenic hilum and anterior to left kidney. There is mild stranding of peripancreatic fat and fat surrounding the superior mesenteric artery. This may be due to edema mild pancreatitis or tumor extension. 3. Atherosclerotic calcifications of abdominal aorta and iliac arteries. No aortic aneurysm. 4. No small bowel obstruction. 5. Moderate size hiatal hernia measures 7 cm. 6. Osteopenia and  degenerative changes thoracolumbar spine.    Assessment/Plan: Ms. Rhude is a 81 y.o. female with several weeks abd pain, n/v.  CT in ED shows pancreatic mass SMV, PVT, splenic vein thrombus. Likely pancreatic Ca.   Unfortunately radiology cannot access this location with CT guided biopsy and EUS not available at this hospital.   Recommendations: - symtpom management , appreicaite palliative care consult - will obtain MRCP to further characterize the mass since CT guided biopsy or EUS not available immediately.  - will need outpt EUS at [facility] for tissue diagnosis.  - agree with anti-coag for SMV thrombus   Thank you for the consult. Please call with questions or concerns.  REIN, Grace Blight, MD

## 2014-10-20 NOTE — Progress Notes (Signed)
Patient ID: [name], female   DOB: 1929-05-08, 81 y.o.   MRN: [id number]   I came back to speak with daughter and grandson and patient at the bedside (face-to-face) I explained the radiological studies. I told them that I spoke with Dr. Rayann Heman gastroenterology, he does not believe that this would be amenable to a CT guided biopsy. That the patient would need an endoscopic ultrasound for biopsy. Endoscopic ultrasound is not available to [place] at this facility.  Family and patient would like to obtain a biopsy and requested a transfer to a tertiary care center.  I'm awaiting to hear back from tertiary care center.  Another 30 minutes spent on patient care.

## 2014-10-20 NOTE — Discharge Summary (Signed)
Physician Discharge Summary  [name] EXB:[id number] DOB: [birth year] DOA: 10/19/2014  PCP: Halina Maidens, MD  Admit date: 10/19/2014 Discharge date: 10/20/2014  Time spent: 65 minutes  Recommendations for Outpatient Follow-up:  1. [facility] hospital  Discharge Diagnoses:  Active Problems:   Pancreatic mass   Discharge Condition: Fair  Diet recommendation: Clear liquids  Filed Weights   10/19/14 0955 10/19/14 1741  Weight: 71.668 kg (158 lb) 71.986 kg (158 lb 11.2 oz)    History of present illness:  Abdominal pain for a few weeks. Nausea and vomiting.  Hospital Course:  #1 nausea vomiting abdominal pain - Found to have a pancreatic mass. Patient and family would like a biopsy. We are unable to do an endoscopic ultrasound and biopsy at this facility. Patient was accepted to [facility] for further evaluation. -Symptomatic management with pain medication and nausea medication. #2 portal and splenic thrombosis -Patient is on heparin drip. Further evaluation at [facility] for determination whether continued anticoagulation is needed or not. #3 essential hypertension - continue Norvasc if able to tolerate. #4 type 2 diabetes controlled - sliding scale for right now. #5 acute respiratory failure with hypoxia -pulse ox as per nurse 89% after pain medication. We'll give supplemental oxygen.   Discharge Exam: Filed Vitals:   10/20/14 1438  BP: 149/63  Pulse: 100  Temp: 97.8 F (36.6 C)  Resp:     Physical Exam  Please see note from today for physical exam. Discharge Instructions    Current Discharge Medication List    START taking these medications   Details  acetaminophen (TYLENOL) 325 MG tablet Take 2 tablets (650 mg total) by mouth every 6 (six) hours as needed for mild pain (or Fever >/= 101). Qty: 1 tablet, Refills: 0    alum & mag hydroxide-simeth (MAALOX/MYLANTA) 200-200-20 MG/5ML suspension Take 30 mLs by mouth every 6 (six) hours as needed for indigestion or heartburn  (dyspepsia). Qty: 355 mL, Refills: 0    heparin 100-0.45 UNIT/ML-% infusion Inject 1,050 Units/hr into the vein continuous. Qty: 250 mL, Refills: 0    HYDROmorphone (DILAUDID) 1 MG/ML injection Inject 1 mL (1 mg total) into the vein every 4 (four) hours as needed for moderate pain. Qty: 1 mL, Refills: 0    insulin aspart (NOVOLOG) 100 UNIT/ML injection Inject 0-9 Units into the skin 3 (three) times daily with meals. Qty: 10 mL, Refills: 11    !! lipase/protease/amylase (CREON) 12000 UNITS CPEP capsule Take 1 capsule (12,000 Units total) by mouth 3 (three) times daily between meals as needed (before snacks). Qty: 270 capsule, Refills: 0    prochlorperazine (COMPAZINE) 5 MG/ML injection Inject 2 mLs (10 mg total) into the vein every 6 (six) hours as needed. Qty: 2 mL, Refills: 0    senna-docusate (SENOKOT-S) 8.6-50 MG per tablet Take 1 tablet by mouth at bedtime as needed for mild constipation. Qty: 1 tablet, Refills: 0     !! - Potential duplicate medications found. Please discuss with provider.    CONTINUE these medications which have NOT CHANGED   Details  amLODipine (NORVASC) 5 MG tablet Take 1 tablet by mouth daily. Take one tablet daily    !! CREON 6000 UNITS CPEP Take 3 capsules by mouth 3 (three) times daily before meals. Patient may take 1 capsule before snack in between meals     !! - Potential duplicate medications found. Please discuss with provider.    STOP taking these medications     docusate sodium (COLACE) 100 MG capsule  metFORMIN (GLUCOPHAGE) 500 MG tablet      ondansetron (ZOFRAN-ODT) 8 MG disintegrating tablet        Allergies  Allergen Reactions  . Aspirin Other (See Comments)    Stomach problems      The results of significant diagnostics from this hospitalization (including imaging, microbiology, ancillary and laboratory) are listed below for reference.    Significant Diagnostic Studies: Ct Abdomen Pelvis W Contrast  10/19/2014   CLINICAL  DATA:  Lower abdominal pain for 1 week  EXAM: CT ABDOMEN AND PELVIS WITH CONTRAST  TECHNIQUE: Multidetector CT imaging of the abdomen and pelvis was performed using the standard protocol following bolus administration of intravenous contrast.  CONTRAST:  181mL OMNIPAQUE IOHEXOL 300 MG/ML  SOLN  COMPARISON:  08/08/2014 and CT report 07/31/1999  FINDINGS: The lung bases shows atelectasis in left base anterolaterally. A large hiatal hernia is noted measures 7 cm. Heart size within normal limits. Sagittal images of the spine shows degenerative changes and diffuse osteopenia thoracolumbar spine.  There is no focal hepatic mass. The patient is status postcholecystectomy. Mild intrahepatic biliary ductal dilatation. There is CBD dilatation up to 1.1 cm. There is a mass in the pancreatic body axial image 26 measures at least 1.8 cm. The mass is obstructing the main pancreatic duct. The pancreatic body and tail are not identified probable significant atrophic or tumor replaced. There is significant dilatation of main pancreatic duct up to 1.2 cm.  There is complete thrombosis or tumor thrombus in portal vein extending in superior mesenteric vein and proximal inferior mesenteric vein. There is complete thrombosis of the splenic pain. Collateral veins are noted in splenic hilum and just anterior to left kidney. Probable pathologic lymph nodes or tumor extension just anterior to pancreas measures 1.2 x 1.1 cm.  Extensive atherosclerotic calcifications of abdominal aorta and iliac arteries. There is stranding of mesenteric fat surrounding the pancreas and SMA. This may be due to edema or mild pancreatitis. Tumor extension cannot be excluded.  Kidneys are symmetrical in size and enhancement. No hydronephrosis or hydroureter. Bilateral splenic vein is patent. The celiac trunk and SMA appears patent.  There is no pericecal inflammation. The terminal ileum is unremarkable. No small bowel obstruction. There are metallic artifacts  from left hip prosthesis. Metallic wires are noted in proximal left femur.  Delayed renal images shows bilateral renal symmetrical excretion. Bilateral visualized proximal ureter is unremarkable.  IMPRESSION: 1. There is mild intrahepatic and extrahepatic biliary ductal dilatation. There is a mass in pancreatic body obstructing the main pancreatic duct. Significant dilatation of main pancreatic duct with indistinctness of the body and tail of the pancreas. Probable adjacent pathologic lymph nodes anterior to the pancreas. 2. There is complete thrombosis of portal vein with extension in intrahepatic portal vein. There is complete thrombosis of splenic vein and superior mesenteric vein. Tumor thrombus cannot be excluded. Some collateral vessels are noted in splenic hilum and anterior to left kidney. There is mild stranding of peripancreatic fat and fat surrounding the superior mesenteric artery. This may be due to edema mild pancreatitis or tumor extension. 3. Atherosclerotic calcifications of abdominal aorta and iliac arteries. No aortic aneurysm. 4. No small bowel obstruction. 5. Moderate size hiatal hernia measures 7 cm. 6. Osteopenia and degenerative changes thoracolumbar spine. These results were called by telephone at the time of interpretation on 10/19/2014 at 3:20 pm to Dr. Ferman Hamming , who verbally acknowledged these results.   Electronically Signed   By: Orlean Bradford.D.  On: 10/19/2014 15:21    Microbiology: No results found for this or any previous visit (from the past 240 hour(s)).   Labs: Basic Metabolic Panel:  Recent Labs Lab 10/19/14 1012 10/19/14 1815  NA 141  --   K 4.0  --   CL 103  --   CO2 25  --   GLUCOSE 212*  --   BUN 19  --   CREATININE 0.50 0.33*  CALCIUM 8.7*  --    Liver Function Tests:  Recent Labs Lab 10/19/14 1012  AST 25  ALT 17  ALKPHOS 84  BILITOT 1.1  PROT 7.2  ALBUMIN 2.9*    Recent Labs Lab 10/19/14 1012  LIPASE 134*   CBC:  Recent  Labs Lab 10/19/14 1012 10/19/14 1815  WBC 8.6 8.2  NEUTROABS 7.0*  --   HGB 12.1 11.1*  HCT 37.3 33.3*  MCV 95.4 94.1  PLT 426 365   CBG:  Recent Labs Lab 10/19/14 1905 10/19/14 2052 10/20/14 0738 10/20/14 1117  GLUCAP 191* 191* 260* 231*    Signed:  Loletha Grayer  10/20/2014, 3:14 PM

## 2014-10-20 NOTE — Consult Note (Signed)
Palliative Medicine Inpatient Consult Note   Name: [name] Date: 10/20/2014 MRN: [id number]  DOB: [birth year]  Referring Physician: Dr Benjie Karvonen  Palliative Care consult requested for this 81 y.o. female for goals of medical therapy in patient with chronic pancreatitis, Hep C, DM, HTN, osteoporosis s/p L.femur fx (04/2007), L.THR (1981), hyperlipdemia, s/p TAH & BSO, appendectomy, cholecystectomy. She was admitted 10/19/14 with abd pain. Abd CT shows pancreatic mass obstructing the pancreatic duct and thombosis of portal, splenic and superior mesenteric veins. CT guided bx ordered. GI, oncology and vascular sgy consults pending. At present, pt is lying in bed. Alert, oriented. Says she is miserable. Complains of nausea/vomiting. No relief from ondansetron. Family not present.    REVIEW OF SYSTEMS:  Pain: Mild Dyspnea:  No Nausea/Vomiting:  Yes Diarrhea:  No Constipation:   No Depression:   No Anxiety:   No Fatigue:   No  SOCIAL HISTORY:  reports that she has never smoked. She has never used smokeless tobacco. She reports that she does not drink alcohol or use illicit drugs. Pt lives at home with her daughter.   CODE STATUS: DNR  PAST MEDICAL HISTORY: Past Medical History  Diagnosis Date  . Pancreatitis   . Hepatitis C   . Femur fracture   . H/O: hysterectomy   . History of cholecystectomy   . Hx of appendectomy     PAST SURGICAL HISTORY:  Past Surgical History  Procedure Laterality Date  . Total hip arthroplasty    . Abdominal hysterectomy    . Appendectomy    . Cholecystectomy      ALLERGIES:  is allergic to aspirin.  MEDICATIONS:  Current Facility-Administered Medications  Medication Dose Route Frequency Provider Last Rate Last Dose  . acetaminophen (TYLENOL) tablet 650 mg  650 mg Oral Q6H PRN Bettey Costa, MD       Or  . acetaminophen (TYLENOL) suppository 650 mg  650 mg Rectal Q6H PRN Bettey Costa, MD      . alum & mag hydroxide-simeth (MAALOX/MYLANTA)  200-200-20 MG/5ML suspension 30 mL  30 mL Oral Q6H PRN Sital Mody, MD      . amLODipine (NORVASC) tablet 5 mg  5 mg Oral Daily Bettey Costa, MD   5 mg at 10/19/14 1819  . docusate sodium (COLACE) capsule 100 mg  100 mg Oral BID Bettey Costa, MD   100 mg at 10/19/14 2133  . heparin ADULT infusion 100 units/mL (25000 units/250 mL)  1,050 Units/hr Intravenous Continuous Sital Mody, MD 10.5 mL/hr at 10/20/14 0729 1,050 Units/hr at 10/20/14 0729  . HYDROmorphone (DILAUDID) injection 1 mg  1 mg Intravenous Q4H PRN Bettey Costa, MD   1 mg at 10/20/14 0355  . insulin aspart (novoLOG) injection 0-9 Units  0-9 Units Subcutaneous TID WC Sital Mody, MD      . lipase/protease/amylase (CREON) capsule 12,000 Units  12,000 Units Oral TID BM PRN Bettey Costa, MD      . lipase/protease/amylase (CREON) capsule 24,000 Units  24,000 Units Oral TID AC Bettey Costa, MD   24,000 Units at 10/19/14 1819  . ondansetron (ZOFRAN) tablet 4 mg  4 mg Oral Q6H PRN Bettey Costa, MD       Or  . ondansetron (ZOFRAN) injection 4 mg  4 mg Intravenous Q6H PRN Bettey Costa, MD   4 mg at 10/20/14 0355  . prochlorperazine (COMPAZINE) injection 10 mg  10 mg Intravenous Q6H PRN Grayland Jack Mylah Baynes, MD      . senna-docusate (Senokot-S) tablet 1  tablet  1 tablet Oral QHS PRN Bettey Costa, MD        Vital Signs: BP 127/93 mmHg  Pulse 101  Temp(Src) 97.5 F (36.4 C) (Oral)  Resp 20  Ht 5\' 2"  (1.575 m)  Wt 71.986 kg (158 lb 11.2 oz)  BMI 29.02 kg/m2  SpO2 92% Filed Weights   10/19/14 0955 10/19/14 1741  Weight: 71.668 kg (158 lb) 71.986 kg (158 lb 11.2 oz)    Estimated body mass index is 29.02 kg/(m^2) as calculated from the following:   Height as of this encounter: 5\' 2"  (1.575 m).   Weight as of this encounter: 71.986 kg (158 lb 11.2 oz).  PERFORMANCE STATUS (ECOG) : 2 - Symptomatic, <50% confined to bed  PHYSICAL EXAM: General appearance: alert, cooperative and severe distress Head: OP clear Neck: supple, symmetrical, trachea midline Resp:  clear to auscultation bilaterally Cardio: regular rate and rhythm, S1, S2 normal, no murmur, click, rub or gallop GI: abnormal findings:  tender to palpation, decreased BS's Genitalia: defer exam Extremities: trace edema  LABS: CBC:    Component Value Date/Time   WBC 8.2 10/19/2014 1815   HGB 11.1* 10/19/2014 1815   HCT 33.3* 10/19/2014 1815   PLT 365 10/19/2014 1815   MCV 94.1 10/19/2014 1815   NEUTROABS 7.0* 10/19/2014 1012   LYMPHSABS 0.5* 10/19/2014 1012   MONOABS 0.9 10/19/2014 1012   EOSABS 0.1 10/19/2014 1012   BASOSABS 0.1 10/19/2014 1012   Comprehensive Metabolic Panel:    Component Value Date/Time   NA 141 10/19/2014 1012   K 4.0 10/19/2014 1012   CL 103 10/19/2014 1012   CO2 25 10/19/2014 1012   BUN 19 10/19/2014 1012   CREATININE 0.33* 10/19/2014 1815   GLUCOSE 212* 10/19/2014 1012   CALCIUM 8.7* 10/19/2014 1012   AST 25 10/19/2014 1012   ALT 17 10/19/2014 1012   ALKPHOS 84 10/19/2014 1012   BILITOT 1.1 10/19/2014 1012   PROT 7.2 10/19/2014 1012   ALBUMIN 2.9* 10/19/2014 1012    IMPRESSION: [name] is an 81 y.o. female with chronic pancreatitis, Hep C, DM, HTN, osteoporosis s/p L.femur fx (04/2007), L.THR (1981), hyperlipdemia, s/p TAH & BSO, appendectomy, cholecystectomy. She was admitted 10/19/14 with abd pain. Abd CT shows pancreatic mass obstructing the pancreatic duct and thombosis of portal, splenic and superior mesenteric veins. CT guided bx ordered. GI, oncology and vascular sgy consults pending.   Pt's primary complaint this AM is nausea and vomiting unrelieved by ondansetron. Will order procholperazine, d/c ondansetron.   Pt is aware of the findings on CT and of the pending work-up. I will follow up after dx plan in place.    PLAN: 1. Prochloperazine 10mg  IV q 6 hrs nausea/vomiting 2. D/c ondansetron   More than 50% of the visit was spent in counseling/coordination of care: Yes  Time spent: 70 minutes

## 2014-10-20 NOTE — Care Management (Signed)
Admitted to [facility] with the diagnosis of pancreatic mass. Daughter is [name] [phone]),. Sees Dr. Army Melia.  Pallative Care, Oncology, GI, and Surgery consults, Scheduled for CT guided biopsy today. Shelbie Ammons RN MSN Care Management [phone]

## 2014-10-21 ENCOUNTER — Inpatient Hospital Stay: Payer: Commercial Managed Care - HMO

## 2014-10-21 DIAGNOSIS — K868 Other specified diseases of pancreas: Principal | ICD-10-CM

## 2014-10-21 DIAGNOSIS — I82890 Acute embolism and thrombosis of other specified veins: Secondary | ICD-10-CM

## 2014-10-21 DIAGNOSIS — Z79899 Other long term (current) drug therapy: Secondary | ICD-10-CM

## 2014-10-21 LAB — BETA-2-GLYCOPROTEIN I ABS, IGG/M/A
BETA-2-GLYCOPROTEIN I IGA: 9 GPI IgA units (ref 0–25)
Beta-2 Glyco I IgG: <9 GPI IgG units (ref 0–20)

## 2014-10-21 LAB — CBC
HCT: 30.4 % — ABNORMAL LOW (ref 35.0–47.0)
Hemoglobin: 10.1 g/dL — ABNORMAL LOW (ref 12.0–16.0)
MCH: 31.3 pg (ref 26.0–34.0)
MCHC: 33.3 g/dL (ref 32.0–36.0)
MCV: 93.9 fL (ref 80.0–100.0)
PLATELETS: 343 10*3/uL (ref 150–440)
RBC: 3.24 MIL/uL — AB (ref 3.80–5.20)
RDW: 13.9 % (ref 11.5–14.5)
WBC: 9 10*3/uL (ref 3.6–11.0)

## 2014-10-21 LAB — HEPARIN LEVEL (UNFRACTIONATED)
HEPARIN UNFRACTIONATED: 0.46 [IU]/mL (ref 0.30–0.70)
Heparin Unfractionated: 0.55 IU/mL (ref 0.30–0.70)

## 2014-10-21 LAB — GLUCOSE, CAPILLARY
Glucose-Capillary: 157 mg/dL — ABNORMAL HIGH (ref 70–99)
Glucose-Capillary: 130 mg/dL — ABNORMAL HIGH (ref 70–99)
Glucose-Capillary: 170 mg/dL — ABNORMAL HIGH (ref 70–99)
Glucose-Capillary: 186 mg/dL — ABNORMAL HIGH (ref 70–99)

## 2014-10-21 MED ORDER — GADOBENATE DIMEGLUMINE 529 MG/ML IV SOLN
15.0000 mL | Freq: Once | INTRAVENOUS | Status: AC
  Administered 2014-10-21: 15 mL via INTRAVENOUS

## 2014-10-21 MED ORDER — HEPARIN (PORCINE) IN NACL 100-0.45 UNIT/ML-% IJ SOLN
950.0000 [IU]/h | INTRAMUSCULAR | Status: DC
  Filled 2014-10-21 (×2): qty 250

## 2014-10-21 NOTE — Progress Notes (Signed)
ANTICOAGULATION CONSULT NOTE - Follow Up Consult  Pharmacy Consult for Heparin Indication: portal vein thrombosis  Allergies  Allergen Reactions  . Aspirin Other (See Comments)    Stomach problems    Patient Measurements: Height: 5\' 2"  (157.5 cm) Weight: 158 lb 11.2 oz (71.986 kg) IBW/kg (Calculated) : 50.1 Heparin Dosing Weight: 65  Vital Signs: Temp: 98.7 F (37.1 C) (05/04 2142) Temp Source: Oral (05/04 2142) BP: 139/60 mmHg (05/04 2142) Pulse Rate: 92 (05/04 2142)  Labs:  Recent Labs  10/19/14 1012 10/19/14 1656 10/19/14 1815 10/20/14 0419 10/20/14 1444 10/20/14 2346  HGB 12.1  --  11.1*  --   --   --   HCT 37.3  --  33.3*  --   --   --   PLT 426  --  365  --   --   --   LABPROT  --  13.5  --   --   --   --   INR  --  1.01  --   --   --   --   HEPARINUNFRC  --   --   --  0.14* 0.75* 0.55  CREATININE 0.50  --  0.33*  --   --   --     Estimated Creatinine Clearance: 49.5 mL/min (by C-G formula based on Cr of 0.33).   Medications:  Infusions:  . heparin 1,000 Units/hr (10/20/14 1618)    Assessment: Heparin level is slighly above goal.  Goal of Therapy:  Heparin level 0.3-0.7 units/ml Monitor platelets by anticoagulation protocol: Yes   Plan:  Will decrease infusion to 1000 units/hr and recheck anti-Xa in 8 h.   5/4 PM heparin level 0.55. Continue current regimen. Recheck in AM to confirm stable result.  Ernesta Trabert S 10/21/2014,12:44 AM

## 2014-10-21 NOTE — Consult Note (Signed)
Palliative Medicine Inpatient Consult Follow Up Note   Name: [name] Date: 10/21/2014 MRN: [id number]  DOB: 02-26-1929  Referring Physician: Gladstone Lighter, MD  Palliative Care consult requested for this 81 y.o. female for goals of medical therapy in patient with with chronic pancreatitis, Hep C, DM, HTN, osteoporosis s/p L.femur fx (04/2007), L.THR (1981), hyperlipdemia, s/p TAH & BSO, appendectomy, cholecystectomy. She was admitted 10/19/14 with abd pain. Abd CT shows pancreatic mass obstructing the pancreatic duct and thombosis of portal, splenic and superior mesenteric veins.   This AM, pt in much better spirits. Getting relief from nausea with prn prochlorperazine. Pain relieved with prn hydromorphone. Daughter not present.   REVIEW OF SYSTEMS:  Pain: severe but relieved with prn hydromorphone Dyspnea:  No Nausea/Vomiting:  relieved with prn prochlorperazine Diarrhea:  No Constipation:   No Depression:   No Anxiety:   No Fatigue:   Yes  CODE STATUS: DNR   PAST MEDICAL HISTORY: Past Medical History  Diagnosis Date  . Pancreatitis   . Hepatitis C   . Femur fracture   . H/O: hysterectomy   . History of cholecystectomy   . Hx of appendectomy     PAST SURGICAL HISTORY:  Past Surgical History  Procedure Laterality Date  . Total hip arthroplasty    . Abdominal hysterectomy    . Appendectomy    . Cholecystectomy      Vital Signs: BP 135/62 mmHg  Pulse 81  Temp(Src) 98.7 F (37.1 C) (Oral)  Resp 18  Ht 5\' 2"  (1.575 m)  Wt 71.986 kg (158 lb 11.2 oz)  BMI 29.02 kg/m2  SpO2 96% Filed Weights   10/19/14 0955 10/19/14 1741  Weight: 71.668 kg (158 lb) 71.986 kg (158 lb 11.2 oz)    Estimated body mass index is 29.02 kg/(m^2) as calculated from the following:   Height as of this encounter: 5\' 2"  (1.575 m).   Weight as of this encounter: 71.986 kg (158 lb 11.2 oz).  PHYSICAL EXAM: General appearance: alert and no distress Head: Normocephalic, without  obvious abnormality, atraumatic, OP clear Neck: supple, symmetrical, trachea midline Resp: clear to auscultation bilaterally Cardio: regular rate and rhythm, S1, S2 normal, no murmur, click, rub or gallop GI: tender to palpation, +BS Extremities: no edema Neurologic: Grossly normal  LABS: CBC:    Component Value Date/Time   WBC 9.0 10/21/2014 0501   HGB 10.1* 10/21/2014 0501   HCT 30.4* 10/21/2014 0501   PLT 343 10/21/2014 0501   MCV 93.9 10/21/2014 0501   NEUTROABS 7.0* 10/19/2014 1012   LYMPHSABS 0.5* 10/19/2014 1012   MONOABS 0.9 10/19/2014 1012   EOSABS 0.1 10/19/2014 1012   BASOSABS 0.1 10/19/2014 1012   Comprehensive Metabolic Panel:    Component Value Date/Time   NA 141 10/19/2014 1012   K 4.0 10/19/2014 1012   CL 103 10/19/2014 1012   CO2 25 10/19/2014 1012   BUN 19 10/19/2014 1012   CREATININE 0.33* 10/19/2014 1815   GLUCOSE 212* 10/19/2014 1012   CALCIUM 8.7* 10/19/2014 1012   AST 25 10/19/2014 1012   ALT 17 10/19/2014 1012   ALKPHOS 84 10/19/2014 1012   BILITOT 1.1 10/19/2014 1012   PROT 7.2 10/19/2014 1012   ALBUMIN 2.9* 10/19/2014 1012    IMPRESSION: [name] is an 81 y.o. woman with chronic pancreatitis, Hep C, DM, HTN, osteoporosis s/p L.femur fx (04/2007), L.THR (1981), hyperlipdemia, s/p TAH & BSO, appendectomy, cholecystectomy. She was admitted 10/19/14 with abd pain & intractable nausea & vomiting. Abd  CT shows pancreatic mass obstructing the pancreatic duct and thombosis of portal, splenic and superior mesenteric veins.   Pt states that prochlorperazine has relieved her nausea/vomiting. She was able to eat some broth this AM. Encouraged her to ask for med if needed. She also states that pain is intermittent but severe. Pain relieved by hydromorphone. Will continue.   Plan is for transfer to a tertiary care center for diagnostic work-up. At pt's request, I spoke with pt's daughter by phone and updated her on pt's current condition.   PLAN: 1.  Continue prn prochlorperazine for nausea 2. Continue prn hydromorphone for pain  More than 50% of the visit was spent in counseling/coordination of care: YES  Time spent: 35 minutes

## 2014-10-21 NOTE — Consult Note (Addendum)
[facility] [place]  Referral MD  Reason for Referral: Pancreatic mass, and portal vein, splenic and mesenteric vein thrombosis   HPI: Patient presnted with worsening abdominal pain, very severe over last 2 days prior to admit, subacute pain since feb admission with pancreatitis. Is noted by admitting physician to have recent hx of new dm and htn, as well as recent UTI. CT scan shows pancreatic mass and thrombosis as in problem list above. Dr Delana Meyer consulted, started heparin drip. I have discussed with Dr Ronalee Belts, who advises that CT appearance of vessel caliber consistent with acute portal and mesentric vein thrombosis, There are splenic vein collaterals noted, consistent with acute on chronic. Today an MRCP done, together with CT gives [place] additional views of infiltrating tumor and peripancreatic nodes, see reports below. Admit labs did not include lfts, cre ok, a mild anemia consistnt with malignancy and chronic disease. She is started on norvas, insulin sliding scale, IV dilaudid prn. Pain control today is good, with prn medication. Is tolerating clear liquids.     Past Medical History  Diagnosis Date  . Pancreatitis   . Hepatitis C   . Femur fracture   . H/O: hysterectomy   . History of cholecystectomy   . Hx of appendectomy   :  Past Surgical History  Procedure Laterality Date  . Total hip arthroplasty    . Abdominal hysterectomy    . Appendectomy    . Cholecystectomy    :  Current Facility-Administered Medications  Medication Dose Route Frequency Provider Last Rate Last Dose  . acetaminophen (TYLENOL) tablet 650 mg  650 mg Oral Q6H PRN Bettey Costa, MD       Or  . acetaminophen (TYLENOL) suppository 650 mg  650 mg Rectal Q6H PRN Bettey Costa, MD      . alum & mag hydroxide-simeth (MAALOX/MYLANTA) 200-200-20 MG/5ML suspension 30 mL  30 mL Oral Q6H PRN Sital Mody, MD      . amLODipine (NORVASC) tablet 5 mg  5 mg Oral Daily  Bettey Costa, MD   5 mg at 10/21/14 1032  . docusate sodium (COLACE) capsule 100 mg  100 mg Oral BID Bettey Costa, MD   100 mg at 10/21/14 2038  . heparin ADULT infusion 100 units/mL (25000 units/250 mL)  1,000 Units/hr Intravenous Continuous Loletha Grayer, MD 10 mL/hr at 10/21/14 2038 1,000 Units/hr at 10/21/14 2038  . HYDROmorphone (DILAUDID) injection 1 mg  1 mg Intravenous Q4H PRN Bettey Costa, MD   1 mg at 10/20/14 1136  . insulin aspart (novoLOG) injection 0-9 Units  0-9 Units Subcutaneous TID WC Bettey Costa, MD   2 Units at 10/21/14 1840  . lipase/protease/amylase (CREON) capsule 12,000 Units  12,000 Units Oral TID BM PRN Bettey Costa, MD   12,000 Units at 10/20/14 1425  . lipase/protease/amylase (CREON) capsule 24,000 Units  24,000 Units Oral TID AC Bettey Costa, MD   24,000 Units at 10/21/14 1842  . prochlorperazine (COMPAZINE) injection 10 mg  10 mg Intravenous Q6H PRN [place] Jack Phifer, MD   10 mg at 10/20/14 1127  . senna-docusate (Senokot-S) tablet 1 tablet  1 tablet Oral QHS PRN Bettey Costa, MD         Allergies  Allergen Reactions  . Aspirin Other (See Comments)    Stomach problems   Social hx..never smoker, there is hx in recent chart of regular/modest etoh uses.  Family hx is unknown Exam: Patient Vitals for the  past 24 hrs:  BP Temp Temp src Pulse Resp SpO2  10/21/14 2055 (!) 148/63 mmHg 98.3 F (36.8 C) Oral 90 - 96 %  10/21/14 1505 (!) 143/65 mmHg 98 F (36.7 C) Oral 95 20 97 %  10/21/14 1030 (!) 136/57 mmHg 98.3 F (36.8 C) Oral 83 18 98 %  10/21/14 0509 135/62 mmHg 98.7 F (37.1 C) Oral 81 18 96 %   ROS. NO HEADACHE, DIZZINESS, FEVER CHILLS, EAR OR JAW PAIN, VISUAL DISTURBANCES, SOB, CP, PALPITATIONS, SOB, COUGH, WHEEZING, SHE HAS GENERL WEAKNESS, DIFFICULT TO POSITION IN BED, ARTHRITIS AND STIFF HIP, NO NEW BACK OR BONE PAIN, NO EDEMA OR LEG PAIN, NO RASH, BRUISING, PRURITIS, DENIES DEPRESSION OR ANXIETY, EXCEPT ANXIOUS NOW OVER RECENT ILLNESS AND DIAGNOSIS OF LIKELY  CANCER....Marland KitchenNOTE, RE HPATITIS C, GIVES HX OF TX INTERFERON IN THE PAST, STATES CURED General:  well-nourished in no acute distress.  Eyes:  no scleral icterus.  ENT: No thrush  Neck was   Lymphatics:  Negative cervical, supraclavicular submandibular  or axillary adenopathy.  Respiratory: lungs were clear bilaterally without wheezing or crackles.  Cardiovascular:  Regular rate and rhythm, S1/S2, thre is a loud apical and lsb systoloc murmur .  There was no pedal edema.  GI:  abdomen was soft, distended, slightly tympanitic, some tenderness no guarding or rebound without organomegaly.   Skin exam was without echymosis, petichae.  Neuro exam was nonfocal.  Patient w  Patient was alert and oriented.  Attention was good.   Language was appropriate.  Mood was normal without depression.  Speech was not pressured.  Thought content was not tangential.     Lab Results  Component Value Date   WBC 9.0 10/21/2014   HGB 10.1* 10/21/2014   HCT 30.4* 10/21/2014   PLT 343 10/21/2014   GLUCOSE 212* 10/19/2014   ALT 17 10/19/2014   AST 25 10/19/2014   NA 141 10/19/2014   K 4.0 10/19/2014   CL 103 10/19/2014   CREATININE 0.33* 10/19/2014   BUN 19 10/19/2014   CO2 25 10/19/2014   INR 1.01 10/19/2014    CT AND MRI REVIEWED, SEE EXTENSIVE REPORT    Assessment and Plan:  PANCREATIC MASS MOST LIKELY PANCREATIC ADENOCARCINOMA, OTHER HISTOLOGIES, SUCH AS NEUROENDOCRINE TUMOR,  METASTATIC DISEASE, INFLAMMATORY OR NON MALIGNANT MAS, ALWAYS POSSIBLE. CLINICAL AND CT APPEARANCE, AS REVIEWED WITH VASCULAR SURGERY, CONISITENT WITH ACUTE THROMBOSIS. ETIOLOGY MALIGNANCY, NO SUSPICION OF OTHER HYPERCOAGUABLE STATE.  PANCREATIC CANCER WOULD BE UNRESECTABLE .  THERE IS MILD ANEMIA.     PLAN...SERIAL CBC, CRE, LFT.  CONTINUE HEPARIN. IFGI SYMPTOMS STABLE CAN CHAGE TO ELAQUIS, OTHERWISE CJHANGE TO LOVENOX PRIOR TO D/C. CHANGE TO PO PAIN MEDS WHEN ABLE IN ANTICIPATION OF D/C. WOULD CHECK IRON STUDIES. WOULD CHECK PCR FOR HEP C  RNA... WOULD NOT TRANSFER TO [facility]. WOULD NOT WANT TO STOP ANTICOAGULATION NOW FOR INVASIVE PROCEDURE, MAY WANT TO WAIT AS LONG AS 4 WEEKS .WATCH FOR GI SIGNS/SYMPTOMS OF MESENTERIC THROMBOSIS , GI AND NUTRITIONAL ISSUES. Marland Kitchen ANTICIPATE EUS AND BX  IN 2 TO 4 WEEKS. WILL ALSO CONSULT RADIATION ONCOLLOOGY.  Dallas Schimke, MD @T @,11:01 PM

## 2014-10-21 NOTE — Progress Notes (Addendum)
ANTICOAGULATION CONSULT NOTE - Follow Up Consult  Pharmacy Consult for Heparin Indication: portal vein thrombosis  Allergies  Allergen Reactions   Aspirin Other (See Comments)    Stomach problems    Patient Measurements: Height: 5\' 2"  (157.5 cm) Weight: 158 lb 11.2 oz (71.986 kg) IBW/kg (Calculated) : 50.1 Heparin Dosing Weight: 65  Vital Signs: Temp: 98.7 F (37.1 C) (05/05 0509) Temp Source: Oral (05/05 0509) BP: 135/62 mmHg (05/05 0509) Pulse Rate: 81 (05/05 0509)  Labs:  Recent Labs  10/19/14 1012 10/19/14 1656 10/19/14 1815  10/20/14 1444 10/20/14 2346 10/21/14 0501 10/21/14 0836  HGB 12.1  --  11.1*  --   --   --  10.1*  --   HCT 37.3  --  33.3*  --   --   --  30.4*  --   PLT 426  --  365  --   --   --  343  --   LABPROT  --  13.5  --   --   --   --   --   --   INR  --  1.01  --   --   --   --   --   --   HEPARINUNFRC  --   --   --   < > 0.75* 0.55  --  0.46  CREATININE 0.50  --  0.33*  --   --   --   --   --   < > = values in this interval not displayed.  Estimated Creatinine Clearance: 49.5 mL/min (by C-G formula based on Cr of 0.33).   Medications:  Infusions:   heparin 1,000 Units/hr (10/20/14 1618)   heparin      Assessment: 5/5 AM level therapeutic Goal of Therapy:  Heparin level 0.3-0.7 units/ml Monitor platelets by anticoagulation protocol: Yes    Plan:  Will recheck anti-Xa 5/6 with AM labs.     Abbott Pao 10/21/2014,10:22 AM

## 2014-10-21 NOTE — Progress Notes (Signed)
[facility]    MR#:  [id number]  DATE OF BIRTH:  March 13, 1929  SUBJECTIVE:  CHIEF COMPLAINT:   Chief Complaint  Patient presents with  . Nausea  Patient says her RLQ of abd is tender, but better than yesterday. Also no nausea/vomiting today and able to tolerate clear liquids. For MRCP today per GI orders  REVIEW OF SYSTEMS:  CONSTITUTIONAL: No fever, fatigue or weakness.  EYES: No blurred or double vision.  EARS, NOSE, AND THROAT: No tinnitus or ear pain.  RESPIRATORY: No cough, shortness of breath, wheezing or hemoptysis.  CARDIOVASCULAR: No chest pain, orthopnea, edema.  GASTROINTESTINAL: RLQ abdominal pain. No nausea, vomiting, diarrhea GENITOURINARY: No dysuria, hematuria.  ENDOCRINE: No polyuria, nocturia,  HEMATOLOGY: No anemia, easy bruising or bleeding SKIN: No rash or lesion. MUSCULOSKELETAL: No joint pain or arthritis.   NEUROLOGIC: No tingling, numbness, weakness.  PSYCHIATRY: No anxiety or depression.   DRUG ALLERGIES:   Allergies  Allergen Reactions  . Aspirin Other (See Comments)    Stomach problems    VITALS:  Blood pressure 136/57, pulse 83, temperature 98.3 F (36.8 C), temperature source Oral, resp. rate 18, height 5\' 2"  (1.575 m), weight 71.986 kg (158 lb 11.2 oz), SpO2 98 %.  PHYSICAL EXAMINATION:  GENERAL:  81 y.o.-year-old patient lying in the bed with no acute distress.  EYES: Pupils equal, round, reactive to light and accommodation. No scleral icterus. Extraocular muscles intact.  HEENT: Head atraumatic, normocephalic. Oropharynx and nasopharynx clear.  NECK:  Supple, no jugular venous distention. No thyroid enlargement, no tenderness.  LUNGS: Normal breath sounds bilaterally, no wheezing, rales,rhonchi or crepitation. No use of accessory muscles of respiration.  CARDIOVASCULAR: S1, S2 normal. No murmurs, rubs, or gallops.  ABDOMEN: RLQ abd tenderness with no  guarding or rigidity. Bowel sounds present. No organomegaly or mass.  EXTREMITIES: No pedal edema, cyanosis, or clubbing.  NEUROLOGIC: Cranial nerves II through XII are intact. Muscle strength 5/5 in all extremities. Sensation intact. Gait not checked.  PSYCHIATRIC: The patient is alert and oriented x 3.  SKIN: No obvious rash, lesion, or ulcer.    LABORATORY PANEL:   CBC  Recent Labs Lab 10/21/14 0501  WBC 9.0  HGB 10.1*  HCT 30.4*  PLT 343   ------------------------------------------------------------------------------------------------------------------  Chemistries   Recent Labs Lab 10/19/14 1012 10/19/14 1815  NA 141  --   K 4.0  --   CL 103  --   CO2 25  --   GLUCOSE 212*  --   BUN 19  --   CREATININE 0.50 0.33*  CALCIUM 8.7*  --   AST 25  --   ALT 17  --   ALKPHOS 84  --   BILITOT 1.1  --    ------------------------------------------------------------------------------------------------------------------  Cardiac Enzymes No results for input(s): TROPONINI in the last 168 hours. ------------------------------------------------------------------------------------------------------------------  RADIOLOGY:  Ct Abdomen Pelvis W Contrast  10/19/2014   CLINICAL DATA:  Lower abdominal pain for 1 week  EXAM: CT ABDOMEN AND PELVIS WITH CONTRAST  TECHNIQUE: Multidetector CT imaging of the abdomen and pelvis was performed using the standard protocol following bolus administration of intravenous contrast.  CONTRAST:  152mL OMNIPAQUE IOHEXOL 300 MG/ML  SOLN  COMPARISON:  08/08/2014 and CT report 07/31/1999  FINDINGS: The lung bases shows atelectasis in left base anterolaterally. A large hiatal hernia is noted measures 7 cm. Heart size within normal limits. Sagittal images of the spine shows degenerative changes  and diffuse osteopenia thoracolumbar spine.  There is no focal hepatic mass. The patient is status postcholecystectomy. Mild intrahepatic biliary ductal dilatation.  There is CBD dilatation up to 1.1 cm. There is a mass in the pancreatic body axial image 26 measures at least 1.8 cm. The mass is obstructing the main pancreatic duct. The pancreatic body and tail are not identified probable significant atrophic or tumor replaced. There is significant dilatation of main pancreatic duct up to 1.2 cm.  There is complete thrombosis or tumor thrombus in portal vein extending in superior mesenteric vein and proximal inferior mesenteric vein. There is complete thrombosis of the splenic pain. Collateral veins are noted in splenic hilum and just anterior to left kidney. Probable pathologic lymph nodes or tumor extension just anterior to pancreas measures 1.2 x 1.1 cm.  Extensive atherosclerotic calcifications of abdominal aorta and iliac arteries. There is stranding of mesenteric fat surrounding the pancreas and SMA. This may be due to edema or mild pancreatitis. Tumor extension cannot be excluded.  Kidneys are symmetrical in size and enhancement. No hydronephrosis or hydroureter. Bilateral splenic vein is patent. The celiac trunk and SMA appears patent.  There is no pericecal inflammation. The terminal ileum is unremarkable. No small bowel obstruction. There are metallic artifacts from left hip prosthesis. Metallic wires are noted in proximal left femur.  Delayed renal images shows bilateral renal symmetrical excretion. Bilateral visualized proximal ureter is unremarkable.  IMPRESSION: 1. There is mild intrahepatic and extrahepatic biliary ductal dilatation. There is a mass in pancreatic body obstructing the main pancreatic duct. Significant dilatation of main pancreatic duct with indistinctness of the body and tail of the pancreas. Probable adjacent pathologic lymph nodes anterior to the pancreas. 2. There is complete thrombosis of portal vein with extension in intrahepatic portal vein. There is complete thrombosis of splenic vein and superior mesenteric vein. Tumor thrombus cannot be  excluded. Some collateral vessels are noted in splenic hilum and anterior to left kidney. There is mild stranding of peripancreatic fat and fat surrounding the superior mesenteric artery. This may be due to edema mild pancreatitis or tumor extension. 3. Atherosclerotic calcifications of abdominal aorta and iliac arteries. No aortic aneurysm. 4. No small bowel obstruction. 5. Moderate size hiatal hernia measures 7 cm. 6. Osteopenia and degenerative changes thoracolumbar spine. These results were called by telephone at the time of interpretation on 10/19/2014 at 3:20 pm to Dr. Ferman Hamming , who verbally acknowledged these results.   Electronically Signed   By: Lahoma Crocker M.D.   On: 10/19/2014 15:21    EKG:   Orders placed or performed during the hospital encounter of 10/19/14  . ED EKG  . ED EKG  . EKG 12-Lead  . EKG 12-Lead    ASSESSMENT AND PLAN:   Active Problems:  Pancreatic mass  1. Nausea vomiting and abdominal pain. -secondary to a pancreatic mass and splenic thromboembolism. -Supportive care with IV fluid hydration and nausea medications and pain medications. - Appreciate GI consult  2. Pancreatic mass which is likely a pancreatic cancer. - GI consulted, for MRCP today - Will need EUS and biopsy of pancreatic mass. So awaiting Transfer to [facility] for the same.- Note: Patient has been accepted at [facility]- awaiting bed. - CA 19-9 ordered and pending _ Oncology consult pending  3. Splenic thromboembolism -Currently on heparin drip. - Appreciate vascular consult. Cont heparin drip as will need procedure soon.  4. Essential hypertension - continue Norvasc if able to tolerate.  5. Type 2 diabetes  controlled - sliding scale for right now.  Discussed plan of care with grandson at bedside   All the records are reviewed and case discussed with Care Management/Social Workerr. Management plans discussed with the patient, family and they are in agreement.  CODE STATUS: DNR based on  admission notes  TOTAL TIME TAKING CARE OF THIS PATIENT: 35  minutes.   POSSIBLE TRANSFER TO [facility] FOR EUS AND BIOPSY OF PANCREATIC MASS  Ajane Novella M.D on 10/21/2014 at 12:41 PM  Between 7am to 6pm - Pager - [phone]  After 6pm go to [email or web address] - password [email or web address] [facility] [facility]  Office  [phone]  CC: Primary care physician; Halina Maidens, MD

## 2014-10-21 NOTE — Progress Notes (Signed)
GI Inpatient Follow-up Note  Patient Identification: [name] is a 81 y.o. female with pancreatic mass  Subjective:  Much improved today.  Abd pain and n/v improved. Tolerating clears.  No f/c.   Scheduled Inpatient Medications:  . amLODipine  5 mg Oral Daily  . docusate sodium  100 mg Oral BID  . insulin aspart  0-9 Units Subcutaneous TID WC  . lipase/protease/amylase  24,000 Units Oral TID AC    Continuous Inpatient Infusions:   . heparin 1,000 Units/hr (10/20/14 1618)    PRN Inpatient Medications:  acetaminophen **OR** acetaminophen, alum & mag hydroxide-simeth, HYDROmorphone (DILAUDID) injection, lipase/protease/amylase, prochlorperazine, senna-docusate    Physical Examination: BP 143/65 mmHg  Pulse 95  Temp(Src) 98 F (36.7 C) (Oral)  Resp 20  Ht 5\' 2"  (1.575 m)  Wt 158 lb 11.2 oz (71.986 kg)  BMI 29.02 kg/m2  SpO2 97% Gen: NAD, alert and oriented x 4 HEENT: PEERLA, EOMI, Neck: supple, no JVD or thyromegaly Chest: CTA bilaterally, no wheezes, crackles, or other adventitious sounds CV: RRR, no m/g/c/r Abd: + epi and diffuse ttp, no r/g, nabs Ext: no edema, well perfused with 2+ pulses Skin: no rash or lesions noted Lymph: no LAD  Data: Lab Results  Component Value Date   WBC 9.0 10/21/2014   HGB 10.1* 10/21/2014   HCT 30.4* 10/21/2014   MCV 93.9 10/21/2014   PLT 343 10/21/2014    Recent Labs Lab 10/19/14 1012 10/19/14 1815 10/21/14 0501  HGB 12.1 11.1* 10.1*   Lab Results  Component Value Date   NA 141 10/19/2014   K 4.0 10/19/2014   CL 103 10/19/2014   CO2 25 10/19/2014   BUN 19 10/19/2014   CREATININE 0.33* 10/19/2014   Lab Results  Component Value Date   ALT 17 10/19/2014   AST 25 10/19/2014   ALKPHOS 84 10/19/2014   BILITOT 1.1 10/19/2014    Recent Labs Lab 10/19/14 1656  INR 1.01   Assessment/Plan: [name] is a 81 y.o. female with pancreatic mass.  MRCP also confirms likely invasive panc ca.  Does have CBD  stricture, PD dilation but normal liver enzymes.  - appears transfer to [facility] accepted but may be long wait.   - oncology team also arranging EUS at [facility] which can be done as inpt via road trip next week  or as outpatient - does not need to remain in hospital once symptoms are under control.     Please call with questions or concerns.  Rishan Oyama, Grace Blight, MD

## 2014-10-21 NOTE — Plan of Care (Signed)
Problem: Discharge Progression Outcomes Goal: Tolerating diet Outcome: Progressing Pt denies pain/nausea. Drink water during the night. Pt hesitated to drink other liquid so she will not become nauseated.VSS. No respiratory distress noted.

## 2014-10-22 LAB — GLUCOSE, CAPILLARY
GLUCOSE-CAPILLARY: 145 mg/dL — AB (ref 70–99)
GLUCOSE-CAPILLARY: 143 mg/dL — AB (ref 70–99)
Glucose-Capillary: 176 mg/dL — ABNORMAL HIGH (ref 70–99)
Glucose-Capillary: 110 mg/dL — ABNORMAL HIGH (ref 70–99)

## 2014-10-22 LAB — FACTOR 5 LEIDEN

## 2014-10-22 LAB — COMPREHENSIVE METABOLIC PANEL
ALT: 16 U/L (ref 14–54)
AST: 26 U/L (ref 15–41)
Albumin: 2.7 g/dL — ABNORMAL LOW (ref 3.5–5.0)
Alkaline Phosphatase: 74 U/L (ref 38–126)
Anion gap: 11 (ref 5–15)
BUN: 18 mg/dL (ref 6–20)
CALCIUM: 8.5 mg/dL — AB (ref 8.9–10.3)
CO2: 29 mmol/L (ref 22–32)
CREATININE: 0.35 mg/dL — AB (ref 0.44–1.00)
Chloride: 101 mmol/L (ref 101–111)
GFR calc Af Amer: >60 mL/min (ref >60)
GLUCOSE: 154 mg/dL — AB (ref 65–99)
Potassium: 3 mmol/L — ABNORMAL LOW (ref 3.5–5.1)
Sodium: 141 mmol/L (ref 135–145)
Total Bilirubin: 0.8 mg/dL (ref 0.3–1.2)
Total Protein: 6.3 g/dL — ABNORMAL LOW (ref 6.5–8.1)

## 2014-10-22 LAB — HEPARIN LEVEL (UNFRACTIONATED): HEPARIN UNFRACTIONATED: 0.39 [IU]/mL (ref 0.30–0.70)

## 2014-10-22 LAB — CBC
HEMATOCRIT: 32.4 % — AB (ref 35.0–47.0)
Hemoglobin: 11 g/dL — ABNORMAL LOW (ref 12.0–16.0)
MCH: 32 pg (ref 26.0–34.0)
MCHC: 33.8 g/dL (ref 32.0–36.0)
MCV: 94.4 fL (ref 80.0–100.0)
PLATELETS: 349 10*3/uL (ref 150–440)
RBC: 3.44 MIL/uL — ABNORMAL LOW (ref 3.80–5.20)
RDW: 14.2 % (ref 11.5–14.5)
WBC: 7.2 10*3/uL (ref 3.6–11.0)

## 2014-10-22 LAB — CANCER ANTIGEN 19-9: CA 19-9: 329 U/mL — ABNORMAL HIGH (ref 0–35)

## 2014-10-22 MED ORDER — POTASSIUM CHLORIDE 20 MEQ PO PACK
40.0000 meq | PACK | Freq: Once | ORAL | Status: AC
  Administered 2014-10-22: 40 meq via ORAL
  Filled 2014-10-22: qty 2

## 2014-10-22 MED ORDER — POLYETHYLENE GLYCOL 3350 17 G PO PACK
17.0000 g | PACK | Freq: Every day | ORAL | Status: DC
  Administered 2014-10-22 – 2014-10-25 (×4): 17 g via ORAL
  Filled 2014-10-22 (×3): qty 1

## 2014-10-22 MED ORDER — POTASSIUM CHLORIDE 10 MEQ/100ML IV SOLN
10.0000 meq | INTRAVENOUS | Status: DC
  Filled 2014-10-22 (×4): qty 100

## 2014-10-22 NOTE — Plan of Care (Signed)
Problem: Discharge Progression Outcomes Goal: Other Discharge Outcomes/Goals Outcome: Progressing Pt does not want to transfer to [facility] or [facility], she would like to continue her treatment here. Continued on HEP drip and 90ml/hr. Mirilax ordered for constipation, no complaints of pain at this time.

## 2014-10-22 NOTE — Progress Notes (Signed)
[place] [facility] [place]    MR#:  [id number]  DATE OF BIRTH:  February 10, 1929  SUBJECTIVE:  CHIEF COMPLAINT:   Chief Complaint  Patient presents with  . Nausea   Pt still has still RLQ abd pain, she is wondering if she has to be transferred or can be treated here. No family at bedside today  REVIEW OF SYSTEMS:  CONSTITUTIONAL: No fever, fatigue or weakness.  EYES: No blurred or double vision.  EARS, NOSE, AND THROAT: No tinnitus or ear pain.  RESPIRATORY: No cough, shortness of breath, wheezing or hemoptysis.  CARDIOVASCULAR: No chest pain, orthopnea, edema.  GASTROINTESTINAL: RLQ abdominal pain. No nausea, vomiting, diarrhea GENITOURINARY: No dysuria, hematuria.  ENDOCRINE: No polyuria, nocturia,  HEMATOLOGY: No anemia, easy bruising or bleeding SKIN: No rash or lesion. MUSCULOSKELETAL: No joint pain or arthritis.   NEUROLOGIC: No tingling, numbness, weakness.  PSYCHIATRY: No anxiety or depression.   DRUG ALLERGIES:   Allergies  Allergen Reactions  . Aspirin Other (See Comments)    Stomach problems    VITALS:  Blood pressure 128/57, pulse 77, temperature 98 F (36.7 C), temperature source Oral, resp. rate 20, height 5\' 2"  (1.575 m), weight 71.986 kg (158 lb 11.2 oz), SpO2 95 %.  PHYSICAL EXAMINATION:  GENERAL:  81 y.o.-year-old patient lying in the bed with no acute distress.  EYES: Pupils equal, round, reactive to light and accommodation. No scleral icterus. Extraocular muscles intact.  HEENT: Head atraumatic, normocephalic. Oropharynx and nasopharynx clear.  NECK:  Supple, no jugular venous distention. No thyroid enlargement, no tenderness.  LUNGS: Normal breath sounds bilaterally, no wheezing, rales,rhonchi or crepitation. No use of accessory muscles of respiration.  CARDIOVASCULAR: S1, S2 normal. No murmurs, rubs, or gallops.  ABDOMEN: RLQ abd tenderness with no guarding or rigidity. Bowel sounds  present. No organomegaly or mass.  EXTREMITIES: No pedal edema, cyanosis, or clubbing.  NEUROLOGIC: Cranial nerves II through XII are intact. Muscle strength 5/5 in all extremities. Sensation intact. Gait not checked.  PSYCHIATRIC: The patient is alert and oriented x 3.  SKIN: No obvious rash, lesion, or ulcer.    LABORATORY PANEL:   CBC  Recent Labs Lab 10/22/14 0408  WBC 7.2  HGB 11.0*  HCT 32.4*  PLT 349   ------------------------------------------------------------------------------------------------------------------  Chemistries   Recent Labs Lab 10/22/14 0408  NA 141  K 3.0*  CL 101  CO2 29  GLUCOSE 154*  BUN 18  CREATININE 0.35*  CALCIUM 8.5*  AST 26  ALT 16  ALKPHOS 74  BILITOT 0.8   ------------------------------------------------------------------------------------------------------------------  Cardiac Enzymes No results for input(s): TROPONINI in the last 168 hours. ------------------------------------------------------------------------------------------------------------------  RADIOLOGY:  Mr Lambert Mody Cm/mrcp  10/21/2014   CLINICAL DATA:  Pancreatic mass.  Chronic pancreatitis.  EXAM: MRI ABDOMEN WITHOUT AND WITH CONTRAST (INCLUDING MRCP)  TECHNIQUE: Multiplanar multisequence MR imaging of the abdomen was performed both before and after the administration of intravenous contrast. Heavily T2-weighted images of the biliary and pancreatic ducts were obtained, and three-dimensional MRCP images were rendered by post processing.  CONTRAST:  17mL MULTIHANCE GADOBENATE DIMEGLUMINE 529 MG/ML IV SOLN  COMPARISON:  10/19/2014  FINDINGS: Despite efforts by the technologist and patient, motion artifact is present on today's exam and could not be eliminated. This reduces exam sensitivity and specificity.  Lower chest:  Borderline cardiomegaly.  Small type 1 hiatal hernia.  Hepatobiliary: Nonenhancing 1.0 by 0.5 cm lesion in the dome of segment 2 has high  T2 signal  intensity and probably represents a cyst.  The right and left portal veins, main portal vein, splenic vein, and superior mesenteric vein and several tributaries are completely thrombosed. The thrombosed does not appear to enhance. Hepatic veins patent.  Minimal intrahepatic and moderate extrahepatic biliary dilatation observed with the CBD measuring up to 1.4 cm and tell and irregularly tapers in the vicinity of the head of the pancreas. There is a 1.4 cm segment of stricturing of the CBD on image 11 of series 5, and then a 2.4 cm segment of relatively normal caliber CBD extending to the ampulla. The dilated portion of the biliary system is proximal to the irregular stricture.  No focal hepatic mass identified.  Pancreas: Marked dilatation of the dorsal pancreatic duct in the pancreatic tail and body. This extends to the junction of the pancreatic body and head or I suspect there of being a infiltrative 2.8 by 2.3 cm mass likely causing both the pancreatic duct and biliary stricturing. This mass is very indistinct and primarily infiltrative. Extensive edema at the root of the mesentery.  Spleen: No mass. Fairly unremarkable enhancement pattern suggests collateralization from the splenic vein thrombosis.  Adrenals/Urinary Tract: Unremarkable  Stomach/Bowel: Moderate size type 1 hiatal hernia. Moderate to large size diverticulum of the junction of the second and third parts of the duodenum  Vascular/Lymphatic: Edema surrounds the superior mesenteric artery but I do not see definite tumor around the SMA. I do not observe tumor on the celiac trunk or the proper hepatic artery.  Other: Mesenteric edema.  Musculoskeletal: Marrow heterogeneity in the thoracic and lumbar spine on T2 weighted images. No definite abnormal bony enhancement.  IMPRESSION: 1. There is a great deal of motion artifact on today's exam, not unexpected given the patient's acute illness, and this does reduce the amount of information which can be  reliably gleaned. 2. 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 3. Suspected infiltrative pancreatic head mass causing malignant stricturing of the distal CBD, and stricturing of the dorsal pancreatic duct, leading to dilatation of both of the structures. This infiltrative mass is difficult to measure but is approximately 2.8 by 2.3 cm. I do not observe it to wraparound the arterial vascular structures although the gastroduodenal artery does pass close to the mass. No hepatic metastatic lesions are identified. Conventional arterial vascular supply to the liver. 4. Edema of the root of the mesentery. 5. Marrow heterogeneity is present. Although this can be caused by marrow infiltrative processes, the most common causes include anemia, smoking, obesity, or advancing age. 6. Borderline cardiomegaly. 7. Small type 1 hiatal hernia.   Electronically Signed   By: Van Clines M.D.   On: 10/21/2014 16:10    EKG:   Orders placed or performed during the hospital encounter of 10/19/14  . ED EKG  . ED EKG  . EKG 12-Lead  . EKG 12-Lead    ASSESSMENT AND PLAN:   Active Problems:  Pancreatic mass  1. Nausea vomiting and abdominal pain. -secondary to a pancreatic mass and splenic thromboembolism. -Supportive care with IV fluid hydration and nausea medications and pain medications. - Appreciate GI consult  2. Pancreatic mass which is likely a pancreatic cancer. - GI consulted, for MRCP showing pancreatic mass- likely malignant and acute thrombosis of the veins - Will need EUS and biopsy of pancreatic mass. Either as inpt vs outpt. At this point- talking to Dr. Inez Pilgrim and Dr. Rayann Heman, it  seems the biopsy can be scheduled as outpt after 2 weeks of anticoagulation for this acute clot. - CA 19-9 ordered and pending _ Oncology consulted - Likely poor prgnosis, only palliative trt after diagnosis may be.  3. Splenic  thromboembolism -Currently on heparin drip. - Appreciate vascular consult. If plans for outpt biopsy- will change to weight based theraeputic lovenox tonight and can be continued at discharge.  4. Essential hypertension - continue Norvasc if able to tolerate.  5. Type 2 diabetes controlled - sliding scale for right now.  6. Hypokalemia- will be replaced  Discussed plan of care with family at bedside   All the records are reviewed and case discussed with Care Management/Social Workerr. Management plans discussed with the patient, family and they are in agreement.  CODE STATUS: DNR based on admission notes  TOTAL TIME TAKING CARE OF THIS PATIENT: 42  minutes.    Gladstone Lighter M.D on 10/22/2014 at 12:28 PM  Between 7am to 6pm - Pager - [phone]  After 6pm go to [email or web address] - password [email or web address] [facility]  Office  [phone]  CC: Primary care physician; Halina Maidens, MD

## 2014-10-22 NOTE — Progress Notes (Signed)
ANTICOAGULATION CONSULT NOTE - Follow Up Consult  Pharmacy Consult for Heparin Indication: portal vein thrombosis  Allergies  Allergen Reactions  . Aspirin Other (See Comments)    Stomach problems    Patient Measurements: Height: 5\' 2"  (157.5 cm) Weight: 158 lb 11.2 oz (71.986 kg) IBW/kg (Calculated) : 50.1 Heparin Dosing Weight: 65  Vital Signs: Temp: 98 F (36.7 C) (05/06 0511) Temp Source: Oral (05/06 0511) BP: 128/57 mmHg (05/06 0511) Pulse Rate: 77 (05/06 0511)  Labs:  Recent Labs  10/19/14 1012 10/19/14 1656 10/19/14 1815  10/20/14 2346 10/21/14 0501 10/21/14 0836 10/22/14 0408  HGB 12.1  --  11.1*  --   --  10.1*  --  11.0*  HCT 37.3  --  33.3*  --   --  30.4*  --  32.4*  PLT 426  --  365  --   --  343  --  349  LABPROT  --  13.5  --   --   --   --   --   --   INR  --  1.01  --   --   --   --   --   --   HEPARINUNFRC  --   --   --   < > 0.55  --  0.46 0.39  CREATININE 0.50  --  0.33*  --   --   --   --  0.35*  < > = values in this interval not displayed.  Estimated Creatinine Clearance: 49.5 mL/min (by C-G formula based on Cr of 0.35).   Medications:  Infusions:  . heparin 1,000 Units/hr (10/21/14 2038)    Assessment: Heparin level is slighly above goal.  Goal of Therapy:  Heparin level 0.3-0.7 units/ml Monitor platelets by anticoagulation protocol: Yes   Plan:  Will decrease infusion to 1000 units/hr and recheck anti-Xa in 8 h.   5/4 PM heparin level 0.55. Continue current regimen. Recheck in AM to confirm stable result.  5/6 AM anti-Xa 0.39. Continue current regimen. CBC and anti-Xa ordered with tomorrow AM labs.  Ralph Brouwer S 10/22/2014,6:05 AM

## 2014-10-22 NOTE — Progress Notes (Signed)
GI Inpatient Follow-up Note  Patient Identification: [name] is a 81 y.o. female with pancreatic mass  Subjective:  Feels much better today. N/v resolved. ABd pain decreased. NO f/c, jaundice.   Scheduled Inpatient Medications:  . amLODipine  5 mg Oral Daily  . docusate sodium  100 mg Oral BID  . insulin aspart  0-9 Units Subcutaneous TID WC  . lipase/protease/amylase  24,000 Units Oral TID AC  . polyethylene glycol  17 g Oral Daily  . potassium chloride  10 mEq Intravenous Q1 Hr x 4    Continuous Inpatient Infusions:   . heparin 1,000 Units/hr (10/21/14 2038)    PRN Inpatient Medications:  acetaminophen **OR** acetaminophen, alum & mag hydroxide-simeth, HYDROmorphone (DILAUDID) injection, lipase/protease/amylase, prochlorperazine, senna-docusate     Physical Examination: BP 136/69 mmHg  Pulse 91  Temp(Src) 98.4 F (36.9 C) (Oral)  Resp 20  Ht 5\' 2"  (1.575 m)  Wt 158 lb 11.2 oz (71.986 kg)  BMI 29.02 kg/m2  SpO2 98% Gen: NAD, alert and oriented x 4 HEENT: PEERLA, EOMI, Neck: supple, no JVD or thyromegaly Chest: CTA bilaterally, no wheezes, crackles, or other adventitious sounds CV: RRR, no m/g/c/r Abd: soft, mild diffuse TTP, ND, +BS in all four quadrants; no HSM, guarding, ridigity, or rebound tenderness Ext: no edema, well perfused with 2+ pulses, Skin: no rash or lesions noted Lymph: no LAD  Data: Lab Results  Component Value Date   WBC 7.2 10/22/2014   HGB 11.0* 10/22/2014   HCT 32.4* 10/22/2014   MCV 94.4 10/22/2014   PLT 349 10/22/2014    Recent Labs Lab 10/19/14 1815 10/21/14 0501 10/22/14 0408  HGB 11.1* 10.1* 11.0*   Lab Results  Component Value Date   NA 141 10/22/2014   K 3.0* 10/22/2014   CL 101 10/22/2014   CO2 29 10/22/2014   BUN 18 10/22/2014   CREATININE 0.35* 10/22/2014   Lab Results  Component Value Date   ALT 16 10/22/2014   AST 26 10/22/2014   ALKPHOS 74 10/22/2014   BILITOT 0.8 10/22/2014    Recent  Labs Lab 10/19/14 1656  INR 1.01   Assessment/Plan: [name] is a 81 y.o. female with pancreatic mass,  PVT,SMVT, splenic T.   Recommendations: - outpt EUS ( to be coordinated by Ms Delia Chimes at [facility]), with peri-op coag management by Dr Cynda Acres.  - anti-coag management per Dr Cynda Acres - will need ERCP if develops obstructive jaundice but currently would hold off.   Please call with questions or concerns.  Briahnna Harries, Grace Blight, MD

## 2014-10-22 NOTE — Consult Note (Signed)
Palliative Medicine Inpatient Consult Follow Up Note   Name: [name] Date: 10/22/2014 MRN: [id number]  DOB: 08/31/1928  Referring Physician: Gladstone Lighter, MD  Palliative Care consult requested for this 81 y.o. female for goals of medical therapy in patient with chronic pancreatitis, Hep C, DM, HTN, osteoporosis s/p L.femur fx (04/2007), L.THR (1981), hyperlipdemia, s/p TAH & BSO, appendectomy, cholecystectomy. She was admitted 10/19/14 with abd pain. Abd CT shows pancreatic mass obstructing the pancreatic duct and thombosis of portal, splenic and superior mesenteric veins.   [name] is sitting up in bed. Denies nausea. Pain controlled with hydromorphone. Complains of constipation. Tolerating clear liquid diet. Family not present.   REVIEW OF SYSTEMS:  Pain: intermittent abd/pelvic pain, relieved with hydromorphone Dyspnea:  No Nausea/Vomiting:  Yes and relieved with prochlorperazine Diarrhea:  No Constipation:   Yes Depression:   No Anxiety:   No Fatigue:   Yes  CODE STATUS: DNR   PAST MEDICAL HISTORY: Past Medical History  Diagnosis Date  . Pancreatitis   . Hepatitis C   . Femur fracture   . H/O: hysterectomy   . History of cholecystectomy   . Hx of appendectomy     PAST SURGICAL HISTORY:  Past Surgical History  Procedure Laterality Date  . Total hip arthroplasty    . Abdominal hysterectomy    . Appendectomy    . Cholecystectomy      Vital Signs: BP 128/57 mmHg  Pulse 77  Temp(Src) 98 F (36.7 C) (Oral)  Resp 20  Ht 5\' 2"  (1.575 m)  Wt 71.986 kg (158 lb 11.2 oz)  BMI 29.02 kg/m2  SpO2 95% Filed Weights   10/19/14 0955 10/19/14 1741  Weight: 71.668 kg (158 lb) 71.986 kg (158 lb 11.2 oz)    Estimated body mass index is 29.02 kg/(m^2) as calculated from the following:   Height as of this encounter: 5\' 2"  (1.575 m).   Weight as of this encounter: 71.986 kg (158 lb 11.2 oz).  PHYSICAL EXAM: Generall: NAD HEENT: OP clear Neck: Trachea midline   Cardiovascular: RRR Pulmonary/Chest: Clear ant fields Abdominal: Soft, mild tenderness to palpation, + bowel sounds GU:+ SP tenderness Extremities: No edema  Neurological: Grossly nonfocal Skin: Warm, dry and intact.  Psychiatric: A&O x 3   LABS: CBC:    Component Value Date/Time   WBC 7.2 10/22/2014 0408   HGB 11.0* 10/22/2014 0408   HCT 32.4* 10/22/2014 0408   PLT 349 10/22/2014 0408   MCV 94.4 10/22/2014 0408   NEUTROABS 7.0* 10/19/2014 1012   LYMPHSABS 0.5* 10/19/2014 1012   MONOABS 0.9 10/19/2014 1012   EOSABS 0.1 10/19/2014 1012   BASOSABS 0.1 10/19/2014 1012   Comprehensive Metabolic Panel:    Component Value Date/Time   NA 141 10/22/2014 0408   K 3.0* 10/22/2014 0408   CL 101 10/22/2014 0408   CO2 29 10/22/2014 0408   BUN 18 10/22/2014 0408   CREATININE 0.35* 10/22/2014 0408   GLUCOSE 154* 10/22/2014 0408   CALCIUM 8.5* 10/22/2014 0408   AST 26 10/22/2014 0408   ALT 16 10/22/2014 0408   ALKPHOS 74 10/22/2014 0408   BILITOT 0.8 10/22/2014 0408   PROT 6.3* 10/22/2014 0408   ALBUMIN 2.7* 10/22/2014 0408    IMPRESSION:  Palliative Care consult requested for this 81 y.o. female for goals of medical therapy in patient with chronic pancreatitis, Hep C, DM, HTN, osteoporosis s/p L.femur fx (04/2007), L.THR (1981), hyperlipdemia, s/p TAH & BSO, appendectomy, cholecystectomy. She was admitted 10/19/14 with abd  pain. Abd CT shows pancreatic mass obstructing the pancreatic duct and thombosis of portal, splenic and superior mesenteric veins. Work-up ongoing.   Pt's pain is well-controlled on prn hydromorphone. She has only received 1 dose in the past 24 hrs. Nausea/vomiting also controlled with anti-emetic. Pt complains of constipation, no BM x 3 days. Will start MiraLax.  PLAN: 1. Continue prn hydromorphone 2. Continue prn prochlorperazine 3. Start MiraLax  More than 50% of the visit was spent in counseling/coordination of care: YES  Time spent: 35 minutes

## 2014-10-22 NOTE — Plan of Care (Signed)
Problem: Discharge Progression Outcomes Goal: Discharge plan in place and appropriate Individualization of care  Pt goes by [name].  Lives at home with daughter.  Baseline is independent but is a high fall risk due to generalized weakness Hx of chronic pancreatitis, newly diagnoses HTN and diabetes, controlled by home medications        Goal: Other Discharge Outcomes/Goals Plan of care progress to goals: -no complaint of N/V  -no complaint of pain -Heparin drip continues at ordered rate  -1 assist BSC due to generalized weakness -no fall or injury this shift -pt calm and cooperative  -POA & pt request pt to remain at [facility] instead of being transferred

## 2014-10-23 LAB — CBC
HCT: 32.1 % — ABNORMAL LOW (ref 35.0–47.0)
HEMOGLOBIN: 10.5 g/dL — AB (ref 12.0–16.0)
MCH: 30.7 pg (ref 26.0–34.0)
MCHC: 32.7 g/dL (ref 32.0–36.0)
MCV: 93.9 fL (ref 80.0–100.0)
Platelets: 334 10*3/uL (ref 150–440)
RBC: 3.42 MIL/uL — ABNORMAL LOW (ref 3.80–5.20)
RDW: 14 % (ref 11.5–14.5)
WBC: 7 10*3/uL (ref 3.6–11.0)

## 2014-10-23 LAB — GLUCOSE, CAPILLARY
GLUCOSE-CAPILLARY: 180 mg/dL — AB (ref 70–99)
Glucose-Capillary: 152 mg/dL — ABNORMAL HIGH (ref 70–99)
Glucose-Capillary: 164 mg/dL — ABNORMAL HIGH (ref 70–99)

## 2014-10-23 LAB — HEPARIN LEVEL (UNFRACTIONATED)
HEPARIN UNFRACTIONATED: 0.24 [IU]/mL — AB (ref 0.30–0.70)
Heparin Unfractionated: 0.22 IU/mL — ABNORMAL LOW (ref 0.30–0.70)

## 2014-10-23 MED ORDER — ENOXAPARIN SODIUM 120 MG/0.8ML ~~LOC~~ SOLN
1.5000 mg/kg | SUBCUTANEOUS | Status: DC
  Administered 2014-10-23 – 2014-10-25 (×3): 110 mg via SUBCUTANEOUS
  Filled 2014-10-23 (×3): qty 0.8

## 2014-10-23 MED ORDER — HEPARIN BOLUS VIA INFUSION
1000.0000 [IU] | Freq: Once | INTRAVENOUS | Status: DC
Start: 2014-10-23 — End: 2014-10-23
  Filled 2014-10-23: qty 1000

## 2014-10-23 MED ORDER — HEPARIN (PORCINE) IN NACL 100-0.45 UNIT/ML-% IJ SOLN: 1150.0000 [IU]/h | INTRAMUSCULAR | Status: DC

## 2014-10-23 NOTE — Evaluation (Signed)
Physical Therapy Evaluation Patient Details Name: [name] MRN: [id number] DOB: 05-05-1929 Today's Date: 10/23/2014   History of Present Illness  [name] is a 81 y.o. female with a known history of chronic pancreatitis and newly diagnosed hypertension and diabetes who presents with abdominal pain for the past several months. Patient was admitted in February at that time she was diagnosed with chronic pancreatitis. Per family member who is at bedside, her abdominal pain never quite resolved she was recently treated for urinary tract infection. Over the past 2 days her abdominal pain has worsened so she came to the ER for further evaluation. In the ER a CAT scan was performed which showed a pink reticulocyte mass and also portal thrombosis.   Clinical Impression  Patient demonstrates generalized weakness and balance impairment contributing to decreased mobility and increased risk of falling. Patient will benefit from skilled PT services to return her to her prior level of independence at home.    Follow Up Recommendations Home health PT    Equipment Recommendations       Recommendations for Other Services       Precautions / Restrictions Precautions Precautions: Fall (Fall Risk Score 18) Precaution Comments: H/o one fmechanical fall in past 6 months without injury Restrictions Weight Bearing Restrictions: No      Mobility  Bed Mobility Overal bed mobility: Modified Independent             General bed mobility comments: requires use of bedrails  Transfers Overall transfer level: Needs assistance Equipment used: Rolling walker (2 wheeled) Transfers: Sit to/from Stand Sit to Stand: Min guard         General transfer comment: FTSTS 15 sec  Ambulation/Gait Ambulation/Gait assistance: Modified independent (Device/Increase time);Min guard Ambulation Distance (Feet): 150 Feet Assistive device: Rolling walker (2 wheeled) Gait Pattern/deviations: Decreased  stride length;Step-through pattern Gait velocity: 1.1 ft/sec Gait velocity interpretation: <1.8 ft/sec, indicative of risk for recurrent falls General Gait Details: increased lateral sway; patient fearful of falling  Stairs            Wheelchair Mobility    Modified Rankin (Stroke Patients Only)       Balance Overall balance assessment: Needs assistance;History of Falls               Single Leg Stance - Right Leg: 2 Single Leg Stance - Left Leg: 2   Tandem Stance - Left Leg:  (unable)   Rhomberg - Eyes Closed: 2 High level balance activites: Direction changes High Level Balance Comments: small steppage             Pertinent Vitals/Pain Pain Assessment: 0-10 Pain Score: 2  Pain Location: abdominal    Home Living Family/patient expects to be discharged to:: Private residence Living Arrangements: Children (daughter) Available Help at Discharge: Family Type of Home: Apartment Home Access: Level entry (4 steps in community (to office to pay rent))     Home Layout: One level Home Equipment: Walker - 4 wheels;Crutches;Bedside commode;Wheelchair - manual      Prior Function Level of Independence: Independent         Comments: Drives, Child psychotherapist Dominance   Dominant Hand: Right    Extremity/Trunk Assessment   Upper Extremity Assessment: Overall WFL for tasks assessed           Lower Extremity Assessment: RLE deficits/detail;LLE deficits/detail (Left knee ext 4-/5, left hip abd 2/5; right hip abd 3/5)  Communication   Communication: No difficulties  Cognition Arousal/Alertness: Awake/alert Behavior During Therapy: WFL for tasks assessed/performed (Pleasant, ready for therapy) Overall Cognitive Status: Within Functional Limits for tasks assessed                      General Comments      Exercises        Assessment/Plan    PT Assessment Patient needs continued PT  services  PT Diagnosis Difficulty walking;Generalized weakness;Other (comment) (Impaired balance)   PT Problem List Decreased strength;Decreased balance;Decreased mobility  PT Treatment Interventions Gait training;Stair training;Functional mobility training;Therapeutic activities;Therapeutic exercise;Balance training;Neuromuscular re-education   PT Goals (Current goals can be found in the Care Plan section) Acute Rehab PT Goals Patient Stated Goal: "To go out a little more, to go to the store and around." PT Goal Formulation: With patient Time For Goal Achievement: 11/06/14 Potential to Achieve Goals: Good    Frequency Min 2X/week   Barriers to discharge        Co-evaluation               End of Session Equipment Utilized During Treatment: Gait belt Activity Tolerance: No increased pain Patient left: in bed;with bed alarm set;with call bell/phone within reach           Time: 1335-1417 PT Time Calculation (min) (ACUTE ONLY): 42 min   Charges:   PT Evaluation $Initial PT Evaluation Tier I: 1 Procedure PT Treatments $Gait Training: 8-22 mins   PT G Codes:       Violet Baldy, PT, [place] 10/23/2014, 2:49 PM

## 2014-10-23 NOTE — Plan of Care (Signed)
Problem: Discharge Progression Outcomes Goal: Discharge plan in place and appropriate Individualization of care  Pt goes by Ms. Oien.   Lives at home with daughter.   Baseline is independent but is a high fall risk due to generalized weakness Hx of chronic pancreatitis, newly diagnoses HTN and diabetes, controlled by home medications        Goal: Other Discharge Outcomes/Goals Pt goes by Ms. Vandenberghe.   Lives at home with daughter.   Baseline is independent but is a high fall risk due to generalized weakness Hx of chronic pancreatitis, newly diagnoses HTN and diabetes, controlled by home medications     Plan of care progress to goals: -c/o abd pain, relieved with PRN pain medication -c/o nausea, relieved with PRN nausea medication -Heparin drip continues at ordered rate   -1 assist BSC due to generalized weakness -no fall or injury this shift -pt calm and cooperative   -POA & pt request pt to remain at [facility] instead of being transferred

## 2014-10-23 NOTE — Progress Notes (Signed)
[name] at [place]    MR#:  [id number]  DATE OF BIRTH:  July 08, 1928  SUBJECTIVE:  CHIEF COMPLAINT:   Chief Complaint  Patient presents with  . Nausea   C/o abdominal fullness and pain, some nause     REVIEW OF SYSTEMS:  CONSTITUTIONAL: No fever, fatigue or weakness.  EYES: No blurred or double vision.  EARS, NOSE, AND THROAT: No tinnitus or ear pain.  RESPIRATORY: No cough, shortness of breath, wheezing or hemoptysis.  CARDIOVASCULAR: No chest pain, orthopnea, edema.  GASTROINTESTINAL: RLQ abdominal pain. No nausea, vomiting, diarrhea GENITOURINARY: No dysuria, hematuria.  ENDOCRINE: No polyuria, nocturia,  HEMATOLOGY: No anemia, easy bruising or bleeding SKIN: No rash or lesion. MUSCULOSKELETAL: No joint pain or arthritis.   NEUROLOGIC: No tingling, numbness, weakness.  PSYCHIATRY: No anxiety or depression.   DRUG ALLERGIES:   Allergies  Allergen Reactions  . Aspirin Other (See Comments)    Stomach problems    VITALS:  Blood pressure 127/61, pulse 77, temperature 98.2 F (36.8 C), temperature source Oral, resp. rate 20, height 5\' 2"  (1.575 m), weight 71.986 kg (158 lb 11.2 oz), SpO2 97 %.  PHYSICAL EXAMINATION:  GENERAL:  81 y.o.-year-old patient lying in the bed with no acute distress.  EYES: Pupils equal, round, reactive to light and accommodation. No scleral icterus. Extraocular muscles intact.  HEENT: Head atraumatic, normocephalic. Oropharynx and nasopharynx clear.  NECK:  Supple, no jugular venous distention. No thyroid enlargement, no tenderness.  LUNGS: Normal breath sounds bilaterally, no wheezing, rales,rhonchi or crepitation. No use of accessory muscles of respiration.  CARDIOVASCULAR: S1, S2 normal. No murmurs, rubs, or gallops.  ABDOMEN: RLQ abd tenderness with no guarding or rigidity. Bowel sounds present. No organomegaly or mass.  EXTREMITIES: No pedal edema, cyanosis, or  clubbing.  NEUROLOGIC: Cranial nerves II through XII are intact. Muscle strength 5/5 in all extremities. Sensation intact. Gait not checked.  PSYCHIATRIC: The patient is alert and oriented x 3.  SKIN: No obvious rash, lesion, or ulcer.    LABORATORY PANEL:   CBC  Recent Labs Lab 10/23/14 0502  WBC 7.0  HGB 10.5*  HCT 32.1*  PLT 334   ------------------------------------------------------------------------------------------------------------------  Chemistries   Recent Labs Lab 10/22/14 0408  NA 141  K 3.0*  CL 101  CO2 29  GLUCOSE 154*  BUN 18  CREATININE 0.35*  CALCIUM 8.5*  AST 26  ALT 16  ALKPHOS 74  BILITOT 0.8   ------------------------------------------------------------------------------------------------------------------  Cardiac Enzymes No results for input(s): TROPONINI in the last 168 hours. ------------------------------------------------------------------------------------------------------------------  RADIOLOGY:  Mr Lambert Mody Cm/mrcp  10/21/2014   CLINICAL DATA:  Pancreatic mass.  Chronic pancreatitis.  EXAM: MRI ABDOMEN WITHOUT AND WITH CONTRAST (INCLUDING MRCP)  TECHNIQUE: Multiplanar multisequence MR imaging of the abdomen was performed both before and after the administration of intravenous contrast. Heavily T2-weighted images of the biliary and pancreatic ducts were obtained, and three-dimensional MRCP images were rendered by post processing.  CONTRAST:  4mL MULTIHANCE GADOBENATE DIMEGLUMINE 529 MG/ML IV SOLN  COMPARISON:  10/19/2014  FINDINGS: Despite efforts by the technologist and patient, motion artifact is present on today's exam and could not be eliminated. This reduces exam sensitivity and specificity.  Lower chest:  Borderline cardiomegaly.  Small type 1 hiatal hernia.  Hepatobiliary: Nonenhancing 1.0 by 0.5 cm lesion in the dome of segment 2 has high T2 signal intensity and probably represents a cyst.  The right and left portal veins, main  portal vein, splenic vein, and superior mesenteric vein and several tributaries are completely thrombosed. The thrombosed does not appear to enhance. Hepatic veins patent.  Minimal intrahepatic and moderate extrahepatic biliary dilatation observed with the CBD measuring up to 1.4 cm and tell and irregularly tapers in the vicinity of the head of the pancreas. There is a 1.4 cm segment of stricturing of the CBD on image 11 of series 5, and then a 2.4 cm segment of relatively normal caliber CBD extending to the ampulla. The dilated portion of the biliary system is proximal to the irregular stricture.  No focal hepatic mass identified.  Pancreas: Marked dilatation of the dorsal pancreatic duct in the pancreatic tail and body. This extends to the junction of the pancreatic body and head or I suspect there of being a infiltrative 2.8 by 2.3 cm mass likely causing both the pancreatic duct and biliary stricturing. This mass is very indistinct and primarily infiltrative. Extensive edema at the root of the mesentery.  Spleen: No mass. Fairly unremarkable enhancement pattern suggests collateralization from the splenic vein thrombosis.  Adrenals/Urinary Tract: Unremarkable  Stomach/Bowel: Moderate size type 1 hiatal hernia. Moderate to large size diverticulum of the junction of the second and third parts of the duodenum  Vascular/Lymphatic: Edema surrounds the superior mesenteric artery but I do not see definite tumor around the SMA. I do not observe tumor on the celiac trunk or the proper hepatic artery.  Other: Mesenteric edema.  Musculoskeletal: Marrow heterogeneity in the thoracic and lumbar spine on T2 weighted images. No definite abnormal bony enhancement.  IMPRESSION: 1. There is a great deal of motion artifact on today's exam, not unexpected given the patient's acute illness, and this does reduce the amount of information which can be reliably gleaned. 2. 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 3. Suspected infiltrative pancreatic head mass causing malignant stricturing of the distal CBD, and stricturing of the dorsal pancreatic duct, leading to dilatation of both of the structures. This infiltrative mass is difficult to measure but is approximately 2.8 by 2.3 cm. I do not observe it to wraparound the arterial vascular structures although the gastroduodenal artery does pass close to the mass. No hepatic metastatic lesions are identified. Conventional arterial vascular supply to the liver. 4. Edema of the root of the mesentery. 5. Marrow heterogeneity is present. Although this can be caused by marrow infiltrative processes, the most common causes include anemia, smoking, obesity, or advancing age. 6. Borderline cardiomegaly. 7. Small type 1 hiatal hernia.   Electronically Signed   By: Van Clines M.D.   On: 10/21/2014 16:10    EKG:   Orders placed or performed during the hospital encounter of 10/19/14  . ED EKG  . ED EKG  . EKG 12-Lead  . EKG 12-Lead    ASSESSMENT AND PLAN:   Active Problems:  Pancreatic mass  1. Nausea vomiting and abdominal pain. -secondary to a pancreatic mass and splenic thromboembolism. -Supportive care  - explained to patient pain will likey not go away due to mass and thrombosis  2. Pancreatic mass which is likely a pancreatic cancer. - GI consulted, MRCP showing pancreatic mass- likely malignant and acute thrombosis of the veins - Will need EUS and biopsy of pancreatic mass. bx done as out patient per  Dr. Inez Pilgrim and Dr. Rayann Heman, - CA 19-9 elevated_ Oncology consulted - Likely poor prgnosis, only palliative trt after diagnosis may be.  3. Splenic  thromboembolism -Currently on heparin drip. - Appreciate vascular consult. If plans for outpt biopsy- will change to weight based theraeputic lovenox 4. Essential hypertension - continue Norvasc if able to tolerate.  5. Type 2 diabetes controlled - sliding  scale for right now.  6. Hypokalemia- will be replaced  Discussed plan of care with family at bedside   All the records are reviewed and case discussed with Care Management/Social Workerr. Management plans discussed with the patient, family and they are in agreement.  CODE STATUS: DNR based on admission notes  TOTAL TIME TAKING CARE OF THIS PATIENT: 89min   Coda Mathey M.D on 10/23/2014 at 2:09 PM  Between 7am to 6pm - Pager - [phone]  After 6pm go to [email or web address] - password [email or web address] [facility]  Office  [phone]  CC: Primary care physician; Halina Maidens, MD

## 2014-10-23 NOTE — Progress Notes (Signed)
ANTICOAGULATION CONSULT NOTE - Follow Up Consult  Pharmacy Consult for Heparin Indication: portal vein thrombosis  Allergies  Allergen Reactions  . Aspirin Other (See Comments)    Stomach problems    Patient Measurements: Height: 5\' 2"  (157.5 cm) Weight: 158 lb 11.2 oz (71.986 kg) IBW/kg (Calculated) : 50.1 Heparin Dosing Weight: 65  Vital Signs: Temp: 98.2 F (36.8 C) (05/07 0449) Temp Source: Oral (05/07 0449) BP: 127/61 mmHg (05/07 0449) Pulse Rate: 77 (05/07 0449)  Labs:  Recent Labs  10/21/14 0501 10/21/14 0836 10/22/14 0408 10/23/14 0502  HGB 10.1*  --  11.0* 10.5*  HCT 30.4*  --  32.4* 32.1*  PLT 343  --  349 334  HEPARINUNFRC  --  0.46 0.39 0.22*  CREATININE  --   --  0.35*  --     Estimated Creatinine Clearance: 49.5 mL/min (by C-G formula based on Cr of 0.35).   Medications:  Infusions:  . heparin      Assessment: Heparin level is slighly above goal.  Goal of Therapy:  Heparin level 0.3-0.7 units/ml Monitor platelets by anticoagulation protocol: Yes   Plan:  Will decrease infusion to 1000 units/hr and recheck anti-Xa in 8 h.   5/4 PM heparin level 0.55. Continue current regimen. Recheck in AM to confirm stable result.  5/6 AM anti-Xa 0.39. Continue current regimen. CBC and anti-Xa ordered with tomorrow AM labs.  5/7 AM anti-Xa 0.22. 1000 unit bolus and increase infusion rate to 1150 units.hr. Recheck anti-Xa at 14:00.  Modelle Vollmer S 10/23/2014,6:18 AM

## 2014-10-23 NOTE — Plan of Care (Signed)
Problem: Discharge Progression Outcomes Goal: Discharge plan in place and appropriate Individualization of care  Outcome: Progressing No prn pain meds required today. Heparin drip discontinued with lovenox therapy started. Pt will need assistance if has titrable dose by other person. Up to BR with 1+. MD discussed situation with pt today with tentative plans to control pain with medications and symptoms.  Diet advanced to heart with pt tolerating well so far. Encouraged to ambulate in hall with assistance. HIGH fall interventions continued with pt activating alarm twice.

## 2014-10-24 LAB — GLUCOSE, CAPILLARY
Glucose-Capillary: 160 mg/dL — ABNORMAL HIGH (ref 70–99)
Glucose-Capillary: 260 mg/dL — ABNORMAL HIGH (ref 70–99)
Glucose-Capillary: 188 mg/dL — ABNORMAL HIGH (ref 70–99)
Glucose-Capillary: 166 mg/dL — ABNORMAL HIGH (ref 70–99)

## 2014-10-24 MED ORDER — SODIUM CHLORIDE 0.9 % IJ SOLN
3.0000 mL | Freq: Four times a day (QID) | INTRAMUSCULAR | Status: DC
  Administered 2014-10-24 – 2014-10-25 (×2): 3 mL via INTRAVENOUS

## 2014-10-24 MED ORDER — SODIUM CHLORIDE 0.9 % IJ SOLN: 3.0000 mL | INTRAMUSCULAR | Status: DC | PRN

## 2014-10-24 MED ORDER — POTASSIUM CHLORIDE 20 MEQ PO PACK
40.0000 meq | PACK | ORAL | Status: AC
  Administered 2014-10-24 (×2): 40 meq via ORAL
  Filled 2014-10-24 (×3): qty 2

## 2014-10-24 NOTE — Plan of Care (Signed)
Problem: Discharge Progression Outcomes Goal: Other Discharge Outcomes/Goals Outcome: Progressing Plan of care progress to goals: -Denies pain this shift -Denies nausea this shift -1 assist BSC due to generalized weakness -no fall or injury this shift -pt calm and cooperative

## 2014-10-24 NOTE — Progress Notes (Signed)
[place] at [place]    MR#:  [id number]  DATE OF BIRTH:  1929-01-14  SUBJECTIVE:  CHIEF COMPLAINT:   Chief Complaint  Patient presents with  . Nausea   Was feeling better earlier, now some nasuea    REVIEW OF SYSTEMS:  CONSTITUTIONAL: No fever, fatigue or weakness.  EYES: No blurred or double vision.  EARS, NOSE, AND THROAT: No tinnitus or ear pain.  RESPIRATORY: No cough, shortness of breath, wheezing or hemoptysis.  CARDIOVASCULAR: No chest pain, orthopnea, edema.  GASTROINTESTINAL: RLQ abdominal pain. No nausea, vomiting, diarrhea GENITOURINARY: No dysuria, hematuria.  ENDOCRINE: No polyuria, nocturia,  HEMATOLOGY: No anemia, easy bruising or bleeding SKIN: No rash or lesion. MUSCULOSKELETAL: No joint pain or arthritis.   NEUROLOGIC: No tingling, numbness, weakness.  PSYCHIATRY: No anxiety or depression.   DRUG ALLERGIES:   Allergies  Allergen Reactions  . Aspirin Other (See Comments)    Stomach problems    VITALS:  Blood pressure 133/60, pulse 88, temperature 98.2 F (36.8 C), temperature source Oral, resp. rate 19, height 5\' 2"  (1.575 m), weight 71.986 kg (158 lb 11.2 oz), SpO2 95 %.  PHYSICAL EXAMINATION:  GENERAL:  81 y.o.-year-old patient lying in the bed with no acute distress.  EYES: Pupils equal, round, reactive to light and accommodation. No scleral icterus. Extraocular muscles intact.  HEENT: Head atraumatic, normocephalic. Oropharynx and nasopharynx clear.  NECK:  Supple, no jugular venous distention. No thyroid enlargement, no tenderness.  LUNGS: Normal breath sounds bilaterally, no wheezing, rales,rhonchi or crepitation. No use of accessory muscles of respiration.  CARDIOVASCULAR: S1, S2 normal. No murmurs, rubs, or gallops.  ABDOMEN: RLQ abd tenderness with no guarding or rigidity. Bowel sounds present. No organomegaly or mass.  EXTREMITIES: No pedal edema, cyanosis, or  clubbing.  NEUROLOGIC: Cranial nerves II through XII are intact. Muscle strength 5/5 in all extremities. Sensation intact. Gait not checked.  PSYCHIATRIC: The patient is alert and oriented x 3.  SKIN: No obvious rash, lesion, or ulcer.    LABORATORY PANEL:   CBC  Recent Labs Lab 10/23/14 0502  WBC 7.0  HGB 10.5*  HCT 32.1*  PLT 334   ------------------------------------------------------------------------------------------------------------------  Chemistries   Recent Labs Lab 10/22/14 0408  NA 141  K 3.0*  CL 101  CO2 29  GLUCOSE 154*  BUN 18  CREATININE 0.35*  CALCIUM 8.5*  AST 26  ALT 16  ALKPHOS 74  BILITOT 0.8   ------------------------------------------------------------------------------------------------------------------  Cardiac Enzymes No results for input(s): TROPONINI in the last 168 hours. ------------------------------------------------------------------------------------------------------------------  RADIOLOGY:  No results found.  EKG:   Orders placed or performed during the hospital encounter of 10/19/14  . ED EKG  . ED EKG  . EKG 12-Lead  . EKG 12-Lead    ASSESSMENT AND PLAN:   Active Problems:  Pancreatic mass  1. Nausea vomiting and abdominal pain. -secondary to a pancreatic mass and splenic thromboembolism. -Supportive care  -  Pain control try po pain meds  2. Pancreatic mass which is likely a pancreatic cancer. - GI consulted, MRCP showing pancreatic mass- likely malignant and acute thrombosis of the veins - Will need EUS and biopsy of pancreatic mass. bx will be done as out patient per  Dr. Inez Pilgrim and Dr. Rayann Heman, - CA 19-9 elevated  -   poor prognosis   3. Splenic thromboembolism - Lovenox sq until bx done then change to eliquis or xarelto per Dr. Cynda Acres  4.hypertension -  continue Norvasc if able to tolerate.  5. Type 2 diabetes controlled - sliding scale for right now.  6. Hypokalemia- will be replaced, and  recheck in am    All the records are reviewed and case discussed with Care Management/Social Workerr. Management plans discussed with the patient, family and they are in agreement.  CODE STATUS: DNR based on admission notes  TOTAL TIME TAKING CARE OF THIS PATIENT: 81min   Aleathia Purdy M.D on 10/24/2014 at 11:38 AM  Between 7am to 6pm - Pager - [phone]  After 6pm go to [email or web address] - password [email or web address] [facility]  Office  [phone]  CC: Primary care physician; Halina Maidens, MD

## 2014-10-24 NOTE — Plan of Care (Signed)
Problem: Discharge Progression Outcomes Goal: Discharge plan in place and appropriate Individualization of care  Pt goes by [name].    Lives at home with daughter.    Baseline is independent but is a high fall risk due to generalized weakness Hx of chronic pancreatitis, newly diagnoses HTN and diabetes, controlled by home medications

## 2014-10-24 NOTE — Plan of Care (Signed)
Problem: Discharge Progression Outcomes Goal: Other Discharge Outcomes/Goals Outcome: Progressing Pt not feeling as well today with persistent nausea requiring compazine with decreased appetite. No emesis. Pt reports she will avoid ordering fatty foods with diet selections. No pain reported. K+ supplementation taken. Abdomen remains distended with more firmness and elevation in right lower abdomen with no problems with elimination.  Up to BR/ [facility] with SB+/1+- pt activates bed alarm getting OOB to go to BR with reinforcement to call/offers to toilet. Dgt and family in and reports pt will need extended care placement in that there is no one to care for her home. Pt required assistance with lovenox injection-unable to adjust dose in syringe in planning for possible discharge home Family states they will call discharge planner in a.m to discuss needs.

## 2014-10-25 LAB — GLUCOSE, CAPILLARY
GLUCOSE-CAPILLARY: 149 mg/dL — AB (ref 70–99)
Glucose-Capillary: 277 mg/dL — ABNORMAL HIGH (ref 70–99)

## 2014-10-25 LAB — BASIC METABOLIC PANEL
ANION GAP: 7 (ref 5–15)
BUN: 10 mg/dL (ref 6–20)
CO2: 29 mmol/L (ref 22–32)
Calcium: 8.4 mg/dL — ABNORMAL LOW (ref 8.9–10.3)
Chloride: 107 mmol/L (ref 101–111)
Creatinine, Ser: 0.31 mg/dL — ABNORMAL LOW (ref 0.44–1.00)
GFR calc Af Amer: >60 mL/min (ref >60)
GFR calc non Af Amer: >60 mL/min (ref >60)
GLUCOSE: 170 mg/dL — AB (ref 65–99)
POTASSIUM: 4.1 mmol/L (ref 3.5–5.1)
SODIUM: 143 mmol/L (ref 135–145)

## 2014-10-25 LAB — PROTHROMBIN GENE MUTATION

## 2014-10-25 MED ORDER — ALUM & MAG HYDROXIDE-SIMETH 200-200-20 MG/5ML PO SUSP: 30.0000 mL | ORAL | Status: DC | PRN

## 2014-10-25 MED ORDER — OXYCODONE-ACETAMINOPHEN 7.5-325 MG PO TABS: 1.0000 | ORAL_TABLET | ORAL | Status: DC | PRN

## 2014-10-25 MED ORDER — DOCUSATE SODIUM 100 MG PO CAPS: 100.0000 mg | ORAL_CAPSULE | Freq: Two times a day (BID) | ORAL | Status: DC

## 2014-10-25 MED ORDER — POLYETHYLENE GLYCOL 3350 17 G PO PACK: 17.0000 g | PACK | Freq: Every day | ORAL | Status: DC

## 2014-10-25 MED ORDER — ENOXAPARIN SODIUM 150 MG/ML ~~LOC~~ SOLN: 1.5000 mg/kg | SUBCUTANEOUS | Status: DC

## 2014-10-25 NOTE — Plan of Care (Signed)
Problem: Discharge Progression Outcomes Goal: Other Discharge Outcomes/Goals Outcome: Completed/Met Date Met:  10/25/14 Patient discharged home with Daughter with home health in place.  Patient's pain and symptoms under control at this time.  Patient met all goals.

## 2014-10-25 NOTE — Discharge Summary (Signed)
[name], 81 y.o., DOB 09-29-1928, MRN [id number]. 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 Pancreatitis   . Hepatitis C   . Femur fracture   . H/O: hysterectomy   . History of cholecystectomy   . Hx of appendectomy     Past Surgical History  Procedure Laterality Date  . Total hip arthroplasty    . Abdominal hysterectomy    . Appendectomy    . Cholecystectomy        Hospital Course she presented to the hospital with with abdominal pain. Further workup revealed that she had a pancreatic mass as well as splenic vein thrombosis. She was seen in consultation by GI and as well as oncology. For her thrombosis she was started on heparin and subsequently changed to Lovenox because she will have a EUS done as outpatient. Once the biopsy is done and then she can have be changed to another anticoagulant. She'll be seen by Dr. Elayne Snare will arrange this for her as an outpatient. Patient's abdominal pain is significantly improved her nausea vomiting is also improved and she is doing much better. She does understand the grave prognosis if she does have indeed pancreatic cancer. To note her CA-19-9 was elevated during hospitalization.      Consults  GI, oncology Dr. Cynda Acres  Significant Tests:  See full reports for all details    Ct Abdomen Pelvis W Contrast  10/19/2014   CLINICAL DATA:  Lower abdominal pain for 1 week  EXAM: CT ABDOMEN AND PELVIS WITH CONTRAST  TECHNIQUE: Multidetector CT imaging of the  abdomen and pelvis was performed using the standard protocol following bolus administration of intravenous contrast.  CONTRAST:  139mL OMNIPAQUE IOHEXOL 300 MG/ML  SOLN  COMPARISON:  08/08/2014 and CT report 07/31/1999  FINDINGS: The lung bases shows atelectasis in left base anterolaterally. A large hiatal hernia is noted measures 7 cm. Heart size within normal limits. Sagittal images of the spine shows degenerative changes and diffuse osteopenia thoracolumbar spine.  There is no focal hepatic mass. The patient is status postcholecystectomy. Mild intrahepatic biliary ductal dilatation. There is CBD dilatation up to 1.1 cm. There is a mass in the pancreatic body axial image 26 measures at least 1.8 cm. The mass is obstructing the main pancreatic duct. The pancreatic body and tail are not identified probable significant atrophic or tumor replaced. There is significant dilatation of main pancreatic duct up to 1.2 cm.  There is complete thrombosis or tumor thrombus in portal vein extending in superior mesenteric vein and proximal inferior mesenteric vein. There is complete thrombosis of the splenic pain. Collateral veins are noted in splenic hilum and just anterior to left kidney. Probable pathologic lymph nodes or tumor extension just anterior to pancreas measures 1.2 x 1.1 cm.  Extensive atherosclerotic calcifications of abdominal aorta and iliac arteries. There is stranding of mesenteric fat surrounding the pancreas and SMA. This may be due to edema or mild pancreatitis. Tumor extension cannot be excluded.  Kidneys are symmetrical in size and enhancement. No hydronephrosis or hydroureter. Bilateral splenic vein is patent.  The celiac trunk and SMA appears patent.  There is no pericecal inflammation. The terminal ileum is unremarkable. No small bowel obstruction. There are metallic artifacts from left hip prosthesis. Metallic wires are noted in proximal left femur.  Delayed renal images shows bilateral renal  symmetrical excretion. Bilateral visualized proximal ureter is unremarkable.  IMPRESSION: 1. There is mild intrahepatic and extrahepatic biliary ductal dilatation. There is a mass in pancreatic body obstructing the main pancreatic duct. Significant dilatation of main pancreatic duct with indistinctness of the body and tail of the pancreas. Probable adjacent pathologic lymph nodes anterior to the pancreas. 2. There is complete thrombosis of portal vein with extension in intrahepatic portal vein. There is complete thrombosis of splenic vein and superior mesenteric vein. Tumor thrombus cannot be excluded. Some collateral vessels are noted in splenic hilum and anterior to left kidney. There is mild stranding of peripancreatic fat and fat surrounding the superior mesenteric artery. This may be due to edema mild pancreatitis or tumor extension. 3. Atherosclerotic calcifications of abdominal aorta and iliac arteries. No aortic aneurysm. 4. No small bowel obstruction. 5. Moderate size hiatal hernia measures 7 cm. 6. Osteopenia and degenerative changes thoracolumbar spine. These results were called by telephone at the time of interpretation on 10/19/2014 at 3:20 pm to Dr. Ferman Hamming , who verbally acknowledged these results.   Electronically Signed   By: Lahoma Crocker M.D.   On: 10/19/2014 15:21   Mr Lambert Mody Cm/mrcp  10/21/2014   CLINICAL DATA:  Pancreatic mass.  Chronic pancreatitis.  EXAM: MRI ABDOMEN WITHOUT AND WITH CONTRAST (INCLUDING MRCP)  TECHNIQUE: Multiplanar multisequence MR imaging of the abdomen was performed both before and after the administration of intravenous contrast. Heavily T2-weighted images of the biliary and pancreatic ducts were obtained, and three-dimensional MRCP images were rendered by post processing.  CONTRAST:  76mL MULTIHANCE GADOBENATE DIMEGLUMINE 529 MG/ML IV SOLN  COMPARISON:  10/19/2014  FINDINGS: Despite efforts by the technologist and patient, motion artifact is present on today's exam  and could not be eliminated. This reduces exam sensitivity and specificity.  Lower chest:  Borderline cardiomegaly.  Small type 1 hiatal hernia.  Hepatobiliary: Nonenhancing 1.0 by 0.5 cm lesion in the dome of segment 2 has high T2 signal intensity and probably represents a cyst.  The right and left portal veins, main portal vein, splenic vein, and superior mesenteric vein and several tributaries are completely thrombosed. The thrombosed does not appear to enhance. Hepatic veins patent.  Minimal intrahepatic and moderate extrahepatic biliary dilatation observed with the CBD measuring up to 1.4 cm and tell and irregularly tapers in the vicinity of the head of the pancreas. There is a 1.4 cm segment of stricturing of the CBD on image 11 of series 5, and then a 2.4 cm segment of relatively normal caliber CBD extending to the ampulla. The dilated portion of the biliary system is proximal to the irregular stricture.  No focal hepatic mass identified.  Pancreas: Marked dilatation of the dorsal pancreatic duct in the pancreatic tail and body. This extends to the junction of the pancreatic body and head or I suspect there of being a infiltrative 2.8 by 2.3 cm mass likely causing both the pancreatic duct and biliary stricturing. This mass is very indistinct and primarily infiltrative. Extensive edema at the root of the mesentery.  Spleen: No mass. Fairly unremarkable enhancement pattern suggests collateralization from the splenic vein thrombosis.  Adrenals/Urinary Tract: Unremarkable  Stomach/Bowel: Moderate size type 1 hiatal hernia. Moderate to large  size diverticulum of the junction of the second and third parts of the duodenum  Vascular/Lymphatic: Edema surrounds the superior mesenteric artery but I do not see definite tumor around the SMA. I do not observe tumor on the celiac trunk or the proper hepatic artery.  Other: Mesenteric edema.  Musculoskeletal: Marrow heterogeneity in the thoracic and lumbar spine on T2  weighted images. No definite abnormal bony enhancement.  IMPRESSION: 1. There is a great deal of motion artifact on today's exam, not unexpected given the patient's acute illness, and this does reduce the amount of information which can be reliably gleaned. 2. 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 3. Suspected infiltrative pancreatic head mass causing malignant stricturing of the distal CBD, and stricturing of the dorsal pancreatic duct, leading to dilatation of both of the structures. This infiltrative mass is difficult to measure but is approximately 2.8 by 2.3 cm. I do not observe it to wraparound the arterial vascular structures although the gastroduodenal artery does pass close to the mass. No hepatic metastatic lesions are identified. Conventional arterial vascular supply to the liver. 4. Edema of the root of the mesentery. 5. Marrow heterogeneity is present. Although this can be caused by marrow infiltrative processes, the most common causes include anemia, smoking, obesity, or advancing age. 6. Borderline cardiomegaly. 7. Small type 1 hiatal hernia.   Electronically Signed   By: Van Clines M.D.   On: 10/21/2014 16:10       Today   Subjective:   Porshe Fleagle today has no headache,no chest abdominal pain,no new weakness tingling or numbness, feels much better wants to go home today.   Objective:   Blood pressure 130/59, pulse 85, temperature 98.2 F (36.8 C), temperature source Oral, resp. rate 20, height 5\' 2"  (1.575 m), weight 71.986 kg (158 lb 11.2 oz), SpO2 93 %.  .  Intake/Output Summary (Last 24 hours) at 10/25/14 1304 Last data filed at 10/25/14 1150  Gross per 24 hour  Intake    480 ml  Output    391 ml  Net     89 ml    Exam Awake Alert, Oriented *3, No new F.N deficits, Normal affect [place].AT,PERRAL Supple Neck,No JVD, No cervical lymphadenopathy appreciated.  Symmetrical Chest wall movement,  Good air movement bilaterally, CTAB RRR,No Gallops,Rubs or new Murmurs, No Parasternal Heave +ve B.Sounds, Abd Soft, Non tender, No organomegaly appriciated, No rebound -guarding or rigidity. No Cyanosis, Clubbing or edema, No new Rash or bruise  Data Review   Cultures -    CBC w Diff: Lab Results  Component Value Date   WBC 7.0 10/23/2014   HGB 10.5* 10/23/2014   HCT 32.1* 10/23/2014   PLT 334 10/23/2014   LYMPHOPCT 6% 10/19/2014   MONOPCT 11% 10/19/2014   EOSPCT 1% 10/19/2014   BASOPCT 1% 10/19/2014   CMP: Lab Results  Component Value Date   NA 143 10/25/2014   K 4.1 10/25/2014   CL 107 10/25/2014   CO2 29 10/25/2014   BUN 10 10/25/2014   CREATININE 0.31* 10/25/2014   PROT 6.3* 10/22/2014   ALBUMIN 2.7* 10/22/2014   BILITOT 0.8 10/22/2014   ALKPHOS 74 10/22/2014   AST 26 10/22/2014   ALT 16 10/22/2014  .  Micro Results No results found for this or any previous visit (from the past 240 hour(s)).   Discharge Instructions      Follow-up Information    Follow up with [facility],  LAURA, MD In 7 days.   Specialty:  Family Medicine   Contact information:   [place] [address] [phone]       Follow up with Dallas Schimke, MD In 7 days.   Specialty:  Internal Medicine   Why:  dr. Cynda Acres needs to arrange EUS for bx   Contact information:   [place] [address] [phone]       Discharge Medications     Medication List    TAKE these medications        acetaminophen 325 MG tablet  Commonly known as:  TYLENOL  Take 2 tablets (650 mg total) by mouth every 6 (six) hours as needed for mild pain (or Fever >/= 101).     alum & mag hydroxide-simeth 200-200-20 MG/5ML suspension  Commonly known as:  MAALOX/MYLANTA  Take 30 mLs by mouth every 4 (four) hours as needed for indigestion or heartburn (dyspepsia).     amLODipine 5 MG tablet  Commonly known as:  NORVASC  Take 1 tablet by mouth daily. Take one  tablet daily     CREON 6000 UNITS Cpep  Generic drug:  Pancrelipase (Lip-Prot-Amyl)  Take 3 capsules by mouth 3 (three) times daily before meals. Patient may take 1 capsule before snack in between meals     docusate sodium 100 MG capsule  Commonly known as:  COLACE  Take 1 capsule (100 mg total) by mouth 2 (two) times daily.     enoxaparin 150 MG/ML injection  Commonly known as:  LOVENOX  Inject 0.72 mLs (110 mg total) into the skin daily.     metFORMIN 500 MG tablet  Commonly known as:  GLUCOPHAGE  Take 1 tablet by mouth daily.     ondansetron 8 MG disintegrating tablet  Commonly known as:  ZOFRAN-ODT  Take 8 mg by mouth every 8 (eight) hours as needed.     oxyCODONE-acetaminophen 7.5-325 MG per tablet  Commonly known as:  PERCOCET  Take 1 tablet by mouth every 4 (four) hours as needed for severe pain.     polyethylene glycol packet  Commonly known as:  MIRALAX / GLYCOLAX  Take 17 g by mouth daily.     senna-docusate 8.6-50 MG per tablet  Commonly known as:  Senokot-S  Take 1 tablet by mouth at bedtime as needed for mild constipation.         Total Time in preparing paper work, data evaluation and todays exam - 35 minutes  Dustin Flock M.D on 10/25/2014 at 1:04 PM  [facility]   Office  [phone]

## 2014-10-25 NOTE — Progress Notes (Signed)
Physical Therapy Treatment Patient Details Name: [name] MRN: [id number] DOB: 06/30/1928 Today's Date: 10/25/2014    History of Present Illness Khristy Fuhrmann is a 81 y.o. female with a known history of chronic pancreatitis and newly diagnosed hypertension and diabetes who presents with abdominal pain for the past several months. Patient was admitted in February at that time she was diagnosed with chronic pancreatitis. Per family member who is at bedside, her abdominal pain never quite resolved she was recently treated for urinary tract infection. Over the past 2 days her abdominal pain has worsened so she came to the ER for further evaluation. In the ER a CAT scan was performed which showed a pink reticulocyte mass and also portal thrombosis.     PT Comments    Pt states anticipating d/c today but would like to ambulate, demostrated improved quality of gait with improved confidence. Decreased lateral sway, good cadence, good pacing.  Pt able to tolerate all activity with no c/o pain or significant fatigue.  Pt states feels comfortable returning home with HHPT.    Follow Up Recommendations  Home health PT     Equipment Recommendations       Recommendations for Other Services       Precautions / Restrictions Precautions Precautions: Fall Precaution Comments: H/o one fmechanical fall in past 6 months without injury Restrictions Weight Bearing Restrictions: No    Mobility  Bed Mobility Overal bed mobility: Modified Independent             General bed mobility comments: requires use of bedrails  Transfers Overall transfer level: Needs assistance Equipment used: Rolling walker (2 wheeled) Transfers: Sit to/from Stand Sit to Stand: Supervision            Ambulation/Gait Ambulation/Gait assistance: Modified independent (Device/Increase time) Ambulation Distance (Feet): 400 Feet Assistive device: Rolling walker (2 wheeled)       General Gait Details:  improved confidence in ambulation, decreased sway good cadence   Stairs            Wheelchair Mobility    Modified Rankin (Stroke Patients Only)       Balance Overall balance assessment: Needs assistance Sitting-balance support: No upper extremity supported Sitting balance-Leahy Scale: Good                              Cognition Arousal/Alertness: Awake/alert Behavior During Therapy: WFL for tasks assessed/performed Overall Cognitive Status: Within Functional Limits for tasks assessed                      Exercises General Exercises - Lower Extremity Long Arc Quad: Strengthening;Both;20 reps;Seated Hip ABduction/ADduction: Strengthening;Both;20 reps;Seated Mini-Sqauts: 10 reps;Seated;Strengthening    General Comments        Pertinent Vitals/Pain Pain Assessment: No/denies pain    Home Living                      Prior Function            PT Goals (current goals can now be found in the care plan section) Acute Rehab PT Goals Time For Goal Achievement: 11/06/14 Potential to Achieve Goals: Good Progress towards PT goals: Progressing toward goals    Frequency  Min 2X/week    PT Plan Current plan remains appropriate    Co-evaluation             End of Session Equipment Utilized During Treatment: Gait  belt Activity Tolerance: No increased pain Patient left: in chair;with call bell/phone within reach;with family/visitor present     Time: 8185-6314 PT Time Calculation (min) (ACUTE ONLY): 24 min  Charges:  $Gait Training: 8-22 mins $Therapeutic Exercise: 8-22 mins                    G Codes:      Omari Koslosky Nov 01, 2014, 2:51 PM  Niki Payment, PTA

## 2014-10-25 NOTE — Discharge Instructions (Signed)
sd DIET:  Diabetic diet and Low fat, Low cholesterol diet  DISCHARGE CONDITION:  Stable  ACTIVITY:  Activity as tolerated  OXYGEN:  Home Oxygen: No.   Oxygen Delivery: room air  DISCHARGE LOCATION:  home   If you experience worsening of your admission symptoms, develop shortness of breath, life threatening emergency, suicidal or homicidal thoughts you must seek medical attention immediately by calling 911 or calling your MD immediately  if symptoms less severe.  You Must read complete instructions/literature along with all the possible adverse reactions/side effects for all the Medicines you take and that have been prescribed to you. Take any new Medicines after you have completely understood and accpet all the possible adverse reactions/side effects.   Please note  You were cared for by a hospitalist during your hospital stay. If you have any questions about your discharge medications or the care you received while you were in the hospital after you are discharged, you can call the unit and asked to speak with the hospitalist on call if the hospitalist that took care of you is not available. Once you are discharged, your primary care physician will handle any further medical issues. Please note that NO REFILLS for any discharge medications will be authorized once you are discharged, as it is imperative that you return to your primary care physician (or establish a relationship with a primary care physician if you do not have one) for your aftercare needs so that they can reassess your need for medications and monitor your lab values.

## 2014-10-25 NOTE — Care Management (Signed)
Discharge to home today per Dr. Posey Pronto. Will need Home Health and Physical therapy. Discussed agencies with the family, [facility], Edward Qualia, RN representative for [facility]. Family Will transport. Shelbie Ammons RN MSN Care Management [phone]

## 2014-10-25 NOTE — Plan of Care (Signed)
Problem: Discharge Progression Outcomes Goal: Discharge plan in place and appropriate Individualization of care  Pt goes by [name].     Lives at home with daughter.     Baseline is independent but is a high fall risk due to generalized weakness Hx of chronic pancreatitis, newly diagnoses HTN and diabetes, controlled by home medications      Goal: Other Discharge Outcomes/Goals Plan of care progress to goals: Pain: c/o abd pain, improved with PRN pain medications.  Hemo stable: afebrile this shift, WBC-stable, H/H stable, platelets stable  Diet: no c/o nausea or vomiting this shift.

## 2014-10-27 ENCOUNTER — Other Ambulatory Visit: Payer: Self-pay

## 2014-11-01 ENCOUNTER — Encounter: Payer: Self-pay | Admitting: Internal Medicine

## 2014-11-01 DIAGNOSIS — E876 Hypokalemia: Secondary | ICD-10-CM | POA: Insufficient documentation

## 2014-11-01 DIAGNOSIS — E119 Type 2 diabetes mellitus without complications: Secondary | ICD-10-CM

## 2014-11-01 DIAGNOSIS — K859 Acute pancreatitis without necrosis or infection, unspecified: Secondary | ICD-10-CM | POA: Insufficient documentation

## 2014-11-01 DIAGNOSIS — K5909 Other constipation: Secondary | ICD-10-CM | POA: Insufficient documentation

## 2014-11-01 DIAGNOSIS — H269 Unspecified cataract: Secondary | ICD-10-CM | POA: Insufficient documentation

## 2014-11-01 DIAGNOSIS — I10 Essential (primary) hypertension: Secondary | ICD-10-CM | POA: Insufficient documentation

## 2014-11-01 DIAGNOSIS — I82890 Acute embolism and thrombosis of other specified veins: Secondary | ICD-10-CM | POA: Insufficient documentation

## 2014-11-01 DIAGNOSIS — K8689 Other specified diseases of pancreas: Secondary | ICD-10-CM | POA: Insufficient documentation

## 2014-11-01 DIAGNOSIS — Z8619 Personal history of other infectious and parasitic diseases: Secondary | ICD-10-CM | POA: Insufficient documentation

## 2014-11-01 DIAGNOSIS — K644 Residual hemorrhoidal skin tags: Secondary | ICD-10-CM | POA: Insufficient documentation

## 2014-11-01 DIAGNOSIS — M25559 Pain in unspecified hip: Secondary | ICD-10-CM | POA: Insufficient documentation

## 2014-11-01 HISTORY — DX: Type 2 diabetes mellitus without complications: E11.9

## 2014-11-04 ENCOUNTER — Other Ambulatory Visit: Payer: Self-pay | Admitting: *Deleted

## 2014-11-04 ENCOUNTER — Inpatient Hospital Stay: Payer: Commercial Managed Care - HMO | Attending: Internal Medicine | Admitting: Internal Medicine

## 2014-11-04 ENCOUNTER — Encounter: Payer: Self-pay | Admitting: Internal Medicine

## 2014-11-04 VITALS — BP 144/82 | HR 78 | Temp 98.6°F | Resp 16 | Ht 61.81 in | Wt 152.6 lb

## 2014-11-04 DIAGNOSIS — K8689 Other specified diseases of pancreas: Secondary | ICD-10-CM

## 2014-11-04 DIAGNOSIS — I82890 Acute embolism and thrombosis of other specified veins: Secondary | ICD-10-CM

## 2014-11-04 DIAGNOSIS — M818 Other osteoporosis without current pathological fracture: Secondary | ICD-10-CM | POA: Diagnosis not present

## 2014-11-04 DIAGNOSIS — K868 Other specified diseases of pancreas: Secondary | ICD-10-CM | POA: Diagnosis not present

## 2014-11-04 DIAGNOSIS — I10 Essential (primary) hypertension: Secondary | ICD-10-CM | POA: Diagnosis not present

## 2014-11-04 DIAGNOSIS — Z79899 Other long term (current) drug therapy: Secondary | ICD-10-CM

## 2014-11-04 DIAGNOSIS — I81 Portal vein thrombosis: Secondary | ICD-10-CM | POA: Diagnosis not present

## 2014-11-04 DIAGNOSIS — E119 Type 2 diabetes mellitus without complications: Secondary | ICD-10-CM | POA: Insufficient documentation

## 2014-11-05 ENCOUNTER — Inpatient Hospital Stay: Payer: Commercial Managed Care - HMO

## 2014-11-05 ENCOUNTER — Other Ambulatory Visit: Payer: Self-pay | Admitting: *Deleted

## 2014-11-05 ENCOUNTER — Telehealth: Payer: Self-pay

## 2014-11-05 ENCOUNTER — Telehealth: Payer: Self-pay | Admitting: *Deleted

## 2014-11-05 ENCOUNTER — Other Ambulatory Visit: Payer: Commercial Managed Care - HMO

## 2014-11-05 DIAGNOSIS — K868 Other specified diseases of pancreas: Secondary | ICD-10-CM | POA: Diagnosis not present

## 2014-11-05 DIAGNOSIS — K8689 Other specified diseases of pancreas: Secondary | ICD-10-CM

## 2014-11-05 DIAGNOSIS — I81 Portal vein thrombosis: Secondary | ICD-10-CM

## 2014-11-05 LAB — CBC WITH DIFFERENTIAL/PLATELET
BASOS ABS: 0.1 10*3/uL (ref 0–0.1)
Basophils Relative: 1 %
EOS PCT: 2 %
Eosinophils Absolute: 0.1 10*3/uL (ref 0–0.7)
HEMATOCRIT: 38 % (ref 35.0–47.0)
Hemoglobin: 12.5 g/dL (ref 12.0–16.0)
Lymphocytes Relative: 15 %
Lymphs Abs: 0.6 10*3/uL — ABNORMAL LOW (ref 1.0–3.6)
MCH: 31.4 pg (ref 26.0–34.0)
MCHC: 32.9 g/dL (ref 32.0–36.0)
MCV: 95.3 fL (ref 80.0–100.0)
Monocytes Absolute: 0.5 10*3/uL (ref 0.2–0.9)
Monocytes Relative: 12 %
NEUTROS ABS: 2.8 10*3/uL (ref 1.4–6.5)
Neutrophils Relative %: 70 %
PLATELETS: 192 10*3/uL (ref 150–440)
RBC: 3.98 MIL/uL (ref 3.80–5.20)
RDW: 15.3 % — ABNORMAL HIGH (ref 11.5–14.5)
WBC: 3.9 10*3/uL (ref 3.6–11.0)

## 2014-11-05 LAB — IRON AND TIBC
Iron: 75 ug/dL (ref 28–170)
SATURATION RATIOS: 25 % (ref 10.4–31.8)
TIBC: 305 ug/dL (ref 250–450)
UIBC: 230 ug/dL

## 2014-11-05 LAB — FERRITIN: Ferritin: 66 ng/mL (ref 11–307)

## 2014-11-05 LAB — POTASSIUM: Potassium: 3 mmol/L — ABNORMAL LOW (ref 3.5–5.1)

## 2014-11-05 MED ORDER — ENOXAPARIN SODIUM 80 MG/0.8ML ~~LOC~~ SOLN: 70.0000 mg | Freq: Two times a day (BID) | SUBCUTANEOUS | Status: DC

## 2014-11-05 NOTE — Telephone Encounter (Signed)
[facility], but was told they never received a prescription form [place] for anything

## 2014-11-05 NOTE — Progress Notes (Signed)
Oncology Nurse Navigator Documentation  Oncology Nurse Navigator Flowsheets 11/05/2014  Referral date to RadOnc/MedOnc 64057  Navigator Encounter Type Initial MedOnc  Patient Visit Type Medonc  Treatment Phase Other  Barriers/Navigation Needs No barriers at this time  Interventions Education Method  Coordination of Care Other  Education Method Teach-back  Time Spent with Patient 120   Met with Ms Rosello and her daughter Donna along with Dr Gittin during her hospital follow up visit. Daughter is very anxious regarding diagnosis. Introduced nurse navigator as her resource to make her care less complicated. Went over new Lovenox dose and schedule in detail. Schedule also provided to daughter. Daughter wants further instructions regarding giving Lovenox. Watched her give injection and she is giving injections correctly, she is just anxious regarding. She wants to go over wasting the proper amount again. This was done by Dr Gittin and myself. Will try and obtain another teaching kit and let her practice further tommorow when she comes in for lab work. I will also be going over her instructions for EUS after her labs.  

## 2014-11-05 NOTE — Telephone Encounter (Signed)
Presciption not available at [facility]. NP Magda Paganini submitted Lovenox prescription per Dr Remi Deter plan. [name] notified of this.

## 2014-11-05 NOTE — Progress Notes (Signed)
Oncology Nurse Navigator Documentation  Oncology Nurse Navigator Flowsheets 11/05/2014 11/05/2014  Referral date to RadOnc/MedOnc 64057 -  Navigator Encounter Type Initial MedOnc Education  Patient Visit Type Medonc -  Treatment Phase Other -  Barriers/Navigation Needs No barriers at this time -  Interventions Education Method Education Method  Coordination of Care Other EUS  Education Method Teach-back Teach-back  Time Spent with Patient 120 60   Met with daughter [name] and Ms Pouliot. Went over Lovenox dose and injections again, including wasting and schedule to be given. Once she felt comfortable with the injections, she was instructed on EUS, which is scheduled for 11/18/14 with Dr Earlie Counts. Written instructions provided and she was instructed on how to register for the procedure, how to call and obtain arrival time. Nothing to eat or drink after midnight. She is aware to stop Lovenox on 11/17/14 after the am dose. She is also aware not to resume Lovenox until after EUS is performed. Her daughter [name] will be driving her home.

## 2014-11-06 ENCOUNTER — Other Ambulatory Visit: Payer: Self-pay | Admitting: Internal Medicine

## 2014-11-06 DIAGNOSIS — E876 Hypokalemia: Secondary | ICD-10-CM

## 2014-11-08 ENCOUNTER — Inpatient Hospital Stay: Payer: Commercial Managed Care - HMO

## 2014-11-08 DIAGNOSIS — I81 Portal vein thrombosis: Secondary | ICD-10-CM

## 2014-11-08 DIAGNOSIS — K8689 Other specified diseases of pancreas: Secondary | ICD-10-CM

## 2014-11-08 DIAGNOSIS — K868 Other specified diseases of pancreas: Secondary | ICD-10-CM | POA: Diagnosis not present

## 2014-11-08 LAB — PLATELET COUNT: Platelets: 199 10*3/uL (ref 150–440)

## 2014-11-08 LAB — HEPARIN LEVEL (UNFRACTIONATED): HEPARIN UNFRACTIONATED: 1.36 [IU]/mL — AB (ref 0.30–0.70)

## 2014-11-08 LAB — HEPATITIS C VRS RNA DETECT BY PCR-QUAL: Hepatitis C Vrs RNA by PCR-Qual: POSITIVE — AB

## 2014-11-09 ENCOUNTER — Other Ambulatory Visit: Payer: Self-pay | Admitting: *Deleted

## 2014-11-09 ENCOUNTER — Other Ambulatory Visit: Payer: Self-pay

## 2014-11-11 ENCOUNTER — Inpatient Hospital Stay: Payer: Commercial Managed Care - HMO

## 2014-11-11 ENCOUNTER — Telehealth: Payer: Self-pay

## 2014-11-11 DIAGNOSIS — K868 Other specified diseases of pancreas: Secondary | ICD-10-CM | POA: Diagnosis not present

## 2014-11-11 DIAGNOSIS — E876 Hypokalemia: Secondary | ICD-10-CM

## 2014-11-11 LAB — MAGNESIUM: MAGNESIUM: 2 mg/dL (ref 1.7–2.4)

## 2014-11-11 LAB — POTASSIUM: POTASSIUM: 4.2 mmol/L (ref 3.5–5.1)

## 2014-11-11 NOTE — Telephone Encounter (Signed)
Oncology Nurse Navigator Documentation  Oncology Nurse Navigator Flowsheets 11/05/2014 11/05/2014 11/11/2014 11/11/2014  Referral date to RadOnc/MedOnc 00762 - - -  Navigator Encounter Type Initial MedOnc Education Telephone Telephone  Patient Visit Type Medonc - - -  Treatment Phase Other - - -  Barriers/Navigation Needs No barriers at this time - - -  Interventions Education Method Education Method - -  Coordination of Care Other EUS - MD Appointments  Education Method Teach-back Teach-back - -  Time Spent with Patient 120 60 15 15   Spoke with daughter [name] on the phone. She has received approval to get the rest of the Lovenox injections. She was given appt with Dr Cynda Acres on June 6th at 1430. She has preregistered for EUS on 6/2

## 2014-11-12 ENCOUNTER — Other Ambulatory Visit: Payer: Self-pay

## 2014-11-12 DIAGNOSIS — E118 Type 2 diabetes mellitus with unspecified complications: Secondary | ICD-10-CM

## 2014-11-12 DIAGNOSIS — K859 Acute pancreatitis, unspecified: Secondary | ICD-10-CM

## 2014-11-12 NOTE — Patient Outreach (Signed)
[facility]) Care Management  11/12/2014  [name] [birth year] [id number]   RN CM spoke with patient's healthcare power of attorney, Mrs. [name].  She is patient's daughter and patient gave permission to speak with the daughter.  Mrs. [name] stated patient was recently in the hospital for pancreatitis.  States during this hospital stay patient was diagnosed with a mass on her pancreas.  Stated patient is to get a biopsy of the pancreas June 6th with Dr. Inez Pilgrim to see if the mass is cancerous.  Mrs. [name] is very emotional right now and is able to  verbalizes her frustrations.  She states patient has a nurse from [place] coming 2 times a week to see patient and a Physical Therapist [name]) coming as well.  She reports that she is giving patient Lovenox injections twice a week.  She stated that she will be out of town for a week and will need someone to give the injections to her Mother in her absence.  RN CM suggested she talk with the physician who ordered the Lovenox to get an order for Home Health to administer the medication while she is out of town.  Stated she would talk with the doctor about this.    States she is changing primary physician for her Mother.  She has made an appointment with Dr. Tracie Golden on June 10th to start primary care for her Mother.  RN CM was able to explain the services of [facility] to Mrs. Schecterman.  She states their biggest concern is that her Mother has to get blood work done frequently at [facility]  and that transportation is an issue at times.  She states sometimes she is not able to take patient to appointment and needs support with this issue.  Mrs. [name] and patient are willing to accept the services of [facility].  Mrs. [name] is agreeable to scheduling an appointment.  RN CM will call [facility] nurse to get an update on patient's progress and when the nurse last visit will be.  RN CM will make a referral  for social work to help with transportation issues.   RN CM will call patient again at a later date to assist with other needs.

## 2014-11-16 ENCOUNTER — Other Ambulatory Visit: Payer: Self-pay | Admitting: Family Medicine

## 2014-11-17 ENCOUNTER — Telehealth: Payer: Self-pay

## 2014-11-17 NOTE — Progress Notes (Signed)
[place] note   Referred by Glean Hess, MD [address] [place], [place] [address]   This 81 y.o. female patient presents to the clinic for f/u of pancreatic mas and portal vein thrombosis   Chief Complaint/Problem List: has Pancreatic mass; Acute pancreatitis; Portal vein thrombosis; Nausea with vomiting; Abdominal pain, epigastric; Acute inflammation of the pancreas; Cataract; Chronic constipation; Diabetes mellitus, type 2; External hemorrhoids without complication; Arthralgia of hip; Personal history of infectious and parasitic disease; Benign hypertension; Decreased potassium in the blood; Pancreas (digestive gland) works poorly; and Deep vein thrombosis of splenic vein on her problem list. also anemia and hepatitis C   HPI:  Initial office visit, sen in consult 5/5, see that note and hospital discharge summary, essentially found irregular pancreatic mass, no jaundice, some stricture of CBD  high ca 19/9, likely pancreatic cancer. Also portal vein plus splenic and mesenteric thrombosis. Discharged on lovenox . F/u to monitor anticoagulation, lfts, electrolytes, and move forward with diagnosis and complete staging, by planning EUS. No acute complaints, no abdominal pain or diarrhea, no fever or chills, bruising only at lovenox injection site    Review of Systems:  General: No acute distress,    HEENT: No headache, dizziness, ear or jaw pain, or epistaxis   Lungs: No cough, shortness of breath, at rest, No SOBOE, No wheezing, No chest pain, No, hemoptysis  Cardiac: no chest pain, no palpitations, no orthostasis, no lower extremity    GI: no abdominal pain, nausea, vomiting, diahrrea,   GU: no dysuria, no hematuria, no vaginal bleeding  Musculoskeletal: no back pain, no bone pain, no acutely painful joints,   Extremities: no upper or lower extremity edema  Skin: no bruising, no rash  Neuro: no headache, no dizzy, no focal weakness   Psych: no anxiety, no  depression   Allergies Allergies  Allergen Reactions  . Aspirin Other (See Comments)    Stomach problems    Significant History/PMH: Past Medical History  Diagnosis Date  . Pancreatitis   . Hepatitis C   . Femur fracture   . H/O: hysterectomy   . History of cholecystectomy   . Hx of appendectomy   . Diabetes mellitus, type 2 11/01/2014  . Benign hypertension 11/01/2014   Past Surgical History  Procedure Laterality Date  . Total hip arthroplasty Left   . Abdominal hysterectomy    . Appendectomy    . Cholecystectomy    . Intracapsular cataract extraction Left 2005            Smoking History: Never smoker, some prior hx alcohol  PFSH: Family History:  Family History  Problem Relation Age of Onset  . Hypertension Mother     Comments:   Social History:  History  Alcohol Use  . 0.0 oz/week  . 0 Standard drinks or equivalent per week    Comment: Infrequent use; special occasion;    Additional Past Medical and Surgical History:    Home Medications: Prior to Admission medications   Medication Sig Start Date End Date Taking? Authorizing Provider  acetaminophen (TYLENOL) 325 MG tablet Take 2 tablets (650 mg total) by mouth every 6 (six) hours as needed for mild pain (or Fever >/= 101). Patient taking differently: Take 500 mg by mouth every 6 (six) hours as needed for mild pain (or Fever >/= 101).  10/20/14  Yes Loletha Grayer, MD  amLODipine (NORVASC) 5 MG tablet Take 1 tablet by mouth daily.  10/08/14  Yes Historical Provider, MD  CREON 6000 UNITS CPEP Take 1-3 capsules by mouth 3 (three) times daily before meals. Patient may take 1 capsule before snack in between meals 10/13/14  Yes Historical Provider, MD  metFORMIN (GLUCOPHAGE) 500 MG tablet Take 1 tablet by mouth daily. 10/18/14  Yes Historical Provider, MD  ondansetron (ZOFRAN-ODT) 8 MG disintegrating tablet Take 8 mg by mouth every 8 (eight) hours as needed for nausea.  08/08/14  Yes Historical Provider, MD   acetaminophen (TYLENOL) 500 MG tablet Take 1,000 mg by mouth every 6 (six) hours as needed for moderate pain.    Historical Provider, MD  alum & mag hydroxide-simeth (MAALOX/MYLANTA) 200-200-20 MG/5ML suspension Take 30 mLs by mouth every 4 (four) hours as needed for indigestion or heartburn (dyspepsia). 10/25/14   Dustin Flock, MD  docusate sodium (COLACE) 100 MG capsule Take 1 capsule (100 mg total) by mouth 2 (two) times daily. Patient taking differently: Take 100 mg by mouth 2 (two) times daily as needed for moderate constipation.  10/25/14   Dustin Flock, MD  enoxaparin (LOVENOX) 80 MG/0.8ML injection Inject 0.7 mLs (70 mg total) into the skin every 12 (twelve) hours. As per Dr. Marylene Land schedule 11/05/14   Evlyn Kanner, NP  oxyCODONE-acetaminophen (PERCOCET) 7.5-325 MG per tablet Take 1 tablet by mouth every 4 (four) hours as needed for severe pain. 10/25/14   Dustin Flock, MD  polyethylene glycol (MIRALAX / GLYCOLAX) packet Take 17 g by mouth daily. 10/25/14   Dustin Flock, MD  potassium chloride SA (K-DUR,KLOR-CON) 20 MEQ tablet Take 20 mEq by mouth daily.    Historical Provider, MD  senna-docusate (SENOKOT-S) 8.6-50 MG per tablet Take 1 tablet by mouth at bedtime as needed for mild constipation. 10/20/14   Loletha Grayer, MD    Vital Signs:  Blood pressure 144/82, pulse 78, temperature 98.6 F (37 C), temperature source Tympanic, resp. rate 16, height 5' 1.81" (1.57 m), weight 152 lb 8.9 oz (69.2 kg), SpO2 96 %.  Physical Exam:  General:  no acute distress  Mental Status: alert and oriented to person, place and time  Head, Ears, Nose,Throat: No thrush  Respiratory: no rales, rhonchi, or wheezing, no dullness  Cardiovascular: regular rate and rhythm  Gastrointestinal: soft, non tender, no masses or organomegaly  Musculoskeletal: no lower extremity edema no calf tenderness  Skin: no rashes,   Neurological: No gross focal weakness cranial nerves intact  Lymphatics: Not palpable,  neck supraclavicular, submandibular, axilla    Psych: Mood, Affect, Unremarkable    Laboratory Results:         Radiology Results: MRI AND CT DONE RECENT HOSPITALIZATION         Assessment and Plan: Impression: pANCREATIC MASS MOST LIKELY CANCER, AND CAUSE OF PORTAL VEIN, AND ALSO EXTENSIVE MESENTERIC THROMBOSIS. RE THROMBOSIS, CLINICSLLY STABLE,ABDO BENIGN, NO DIARRHEA OR MALABSORBTION, LOOKS LIKE STABLE AND IMPROVING ON LOVENOX. RE MASS, MOST LIKELY INOPERABLE DUE TO AGE AND THROMBOSIS, LOOKING TO FULL STAGE AND TISSUE DX BY EUS.    Plan:  PLAN TO TRANSITION TO [place], NO CHANGE IMMEDIATELY. NOW HAVE CHANGED LOVENOX DOSE TO WT BASED, Q12H, AND ADVISED AND INSTRUCTED FAMILY, ON DOSING UP TO AND THEN AFTER EUS. WILL HAVE F/U WITH GI COORDINATOR KRISTY. I CHECKED K AND LATER STARTED PO KCL. EXTENSIVE DISCUSSIONS WITH PATIENT AND DAUGHTER RE MASS, DX, PROGNOSIS, ANTICOAGULATION.  EUS PLANNED FOR 6/2. GI COORDINATOR TO ARRANGE F/U

## 2014-11-17 NOTE — Telephone Encounter (Signed)
Oncology Nurse Navigator Documentation  Oncology Nurse Navigator Flowsheets 11/05/2014 11/05/2014 11/11/2014 11/11/2014  Referral date to RadOnc/MedOnc 11/04/2014 - - -  Navigator Encounter Type Initial MedOnc Education Telephone Telephone  Patient Visit Type Medonc - - -  Treatment Phase Other - - -  Barriers/Navigation Needs No barriers at this time - - -  Interventions Education Method Education Method - -  Coordination of Care Other EUS - MD Appointments  Education Method Teach-back Teach-back - -  Time Spent with Patient 010 07 12 19   Oncology Nurse Navigator Documentation  Oncology Nurse Navigator Flowsheets 11/05/2014 11/05/2014 11/11/2014 11/11/2014  Referral date to RadOnc/MedOnc 11/04/2014 - - -  Navigator Encounter Type Initial MedOnc Education Telephone Telephone  Patient Visit Type Medonc - - -  Treatment Phase Other - - -  Barriers/Navigation Needs No barriers at this time - - -  Interventions Education Method Education Method - -  Coordination of Care Other EUS - MD Appointments  Education Method Teach-back Teach-back - -  Time Spent with Patient 120 60 15 15  Received call from [place]. Was told by Endo that it was ok to take her BP pill with a sip of water in the am before EUS. Verified that this was ok. Oncology Nurse Navigator Documentation  Oncology Nurse Navigator Flowsheets 11/05/2014 11/05/2014 11/11/2014 11/11/2014 11/17/2014  Referral date to RadOnc/MedOnc 11/04/2014 - - - -  Navigator Encounter Type Initial MedOnc Education Telephone Telephone Telephone  Patient Visit Type Medonc - - - -  Treatment Phase Other - - - -  Barriers/Navigation Needs No barriers at this time - - - -  Interventions Education Method Education Method - - -  Coordination of Care Other EUS - MD Appointments -  Education Method Teach-back Teach-back - - -  Time Spent with Patient [phone]

## 2014-11-18 ENCOUNTER — Encounter: Payer: Self-pay | Admitting: Anesthesiology

## 2014-11-18 ENCOUNTER — Ambulatory Visit
Admission: RE | Admit: 2014-11-18 | Discharge: 2014-11-18 | Disposition: A | Discharge status: Home / Self Care | Payer: Commercial Managed Care - HMO | Source: Ambulatory Visit | Attending: Gastroenterology | Admitting: Gastroenterology

## 2014-11-18 ENCOUNTER — Encounter
Admission: RE | Disposition: A | Discharge status: Home / Self Care | Payer: Self-pay | Source: Ambulatory Visit | Attending: Gastroenterology

## 2014-11-18 ENCOUNTER — Ambulatory Visit: Payer: Commercial Managed Care - HMO | Admitting: Anesthesiology

## 2014-11-18 DIAGNOSIS — K868 Other specified diseases of pancreas: Secondary | ICD-10-CM | POA: Insufficient documentation

## 2014-11-18 DIAGNOSIS — I10 Essential (primary) hypertension: Secondary | ICD-10-CM | POA: Insufficient documentation

## 2014-11-18 DIAGNOSIS — K2270 Barrett's esophagus without dysplasia: Secondary | ICD-10-CM | POA: Insufficient documentation

## 2014-11-18 DIAGNOSIS — E119 Type 2 diabetes mellitus without complications: Secondary | ICD-10-CM | POA: Diagnosis not present

## 2014-11-18 DIAGNOSIS — K449 Diaphragmatic hernia without obstruction or gangrene: Secondary | ICD-10-CM | POA: Diagnosis not present

## 2014-11-18 DIAGNOSIS — K5710 Diverticulosis of small intestine without perforation or abscess without bleeding: Secondary | ICD-10-CM | POA: Insufficient documentation

## 2014-11-18 DIAGNOSIS — Z79899 Other long term (current) drug therapy: Secondary | ICD-10-CM | POA: Diagnosis not present

## 2014-11-18 DIAGNOSIS — B1920 Unspecified viral hepatitis C without hepatic coma: Secondary | ICD-10-CM | POA: Insufficient documentation

## 2014-11-18 HISTORY — PX: EUS: SHX5427

## 2014-11-18 SURGERY — ESOPHAGEAL ENDOSCOPIC ULTRASOUND (EUS) RADIAL
Anesthesia: General

## 2014-11-18 MED ORDER — SODIUM CHLORIDE 0.9 % IV SOLN
INTRAVENOUS | Status: DC
  Administered 2014-11-18: 1000 mL via INTRAVENOUS

## 2014-11-18 MED ORDER — LIDOCAINE HCL (PF) 1 % IJ SOLN
INTRAMUSCULAR | Status: AC
  Administered 2014-11-18: 2 mL via INTRADERMAL
  Filled 2014-11-18: qty 2

## 2014-11-18 MED ORDER — LIDOCAINE HCL (CARDIAC) 20 MG/ML IV SOLN
INTRAVENOUS | Status: DC | PRN
  Administered 2014-11-18: 20 mg via INTRAVENOUS
  Administered 2014-11-18: 40 mg via INTRAVENOUS
  Administered 2014-11-18: 60 mg via INTRAVENOUS

## 2014-11-18 MED ORDER — PROPOFOL INFUSION 10 MG/ML OPTIME
INTRAVENOUS | Status: DC | PRN
  Administered 2014-11-18: 160 ug/kg/min via INTRAVENOUS

## 2014-11-18 MED ORDER — LIDOCAINE HCL (PF) 1 % IJ SOLN
2.0000 mL | Freq: Once | INTRAMUSCULAR | Status: AC
  Administered 2014-11-18: 2 mL via INTRADERMAL

## 2014-11-18 MED ORDER — MIDAZOLAM HCL 2 MG/2ML IJ SOLN
INTRAMUSCULAR | Status: DC | PRN
  Administered 2014-11-18: 1 mg via INTRAVENOUS

## 2014-11-18 MED ORDER — OMEPRAZOLE 20 MG PO CPDR
20.0000 mg | DELAYED_RELEASE_CAPSULE | Freq: Every day | ORAL | Status: DC
Start: 2014-11-18 — End: 2015-01-05

## 2014-11-18 MED ORDER — ONDANSETRON HCL 4 MG/2ML IJ SOLN
INTRAMUSCULAR | Status: DC | PRN
  Administered 2014-11-18: 4 mg via INTRAVENOUS

## 2014-11-18 MED ORDER — FENTANYL CITRATE (PF) 100 MCG/2ML IJ SOLN
INTRAMUSCULAR | Status: DC | PRN
  Administered 2014-11-18: 50 ug via INTRAVENOUS

## 2014-11-18 NOTE — Anesthesia Preprocedure Evaluation (Signed)
Anesthesia Evaluation  Patient identified by MRN, date of birth, ID band Patient awake    Reviewed: Allergy & Precautions, NPO status , Patient's Chart, lab work & pertinent test results  Airway Mallampati: III   Neck ROM: Full  Mouth opening: Limited Mouth Opening  Dental  (+) Caps Several top front caps:   Pulmonary neg pulmonary ROS,    Pulmonary exam normal       Cardiovascular hypertension, Normal cardiovascular examRhythm:Regular Rate:Normal     Neuro/Psych negative neurological ROS  negative psych ROS   GI/Hepatic (+) Hepatitis -Pancreatic mass   Endo/Other  diabetes, Well Controlled, Type 2, Oral Hypoglycemic Agents  Renal/GU   negative genitourinary   Musculoskeletal negative musculoskeletal ROS (+)   Abdominal Normal abdominal exam  (+)   Peds negative pediatric ROS (+)  Hematology negative hematology ROS (+)   Anesthesia Other Findings   Reproductive/Obstetrics negative OB ROS                             Anesthesia Physical Anesthesia Plan  ASA: III  Anesthesia Plan: General   Post-op Pain Management:    Induction: Intravenous  Airway Management Planned: Nasal Cannula  Additional Equipment:   Intra-op Plan:   Post-operative Plan:   Informed Consent: I have reviewed the patients History and Physical, chart, labs and discussed the procedure including the risks, benefits and alternatives for the proposed anesthesia with the patient or authorized representative who has indicated his/her understanding and acceptance.   Dental advisory given  Plan Discussed with: CRNA and Surgeon  Anesthesia Plan Comments:         Anesthesia Quick Evaluation

## 2014-11-18 NOTE — H&P (Signed)
Primary Care Physician:  Halina Maidens, MD  Pre-Procedure History & Physical: HPI:  [name] is a 81 y.o. female is here for an Upper EUS for evaluation of a pancreatic mass.   Past Medical History  Diagnosis Date  . Pancreatitis   . Hepatitis C   . Femur fracture   . H/O: hysterectomy   . History of cholecystectomy   . Hx of appendectomy   . Diabetes mellitus, type 2 11/01/2014  . Benign hypertension 11/01/2014    Past Surgical History  Procedure Laterality Date  . Total hip arthroplasty Left   . Abdominal hysterectomy    . Appendectomy    . Cholecystectomy    . Intracapsular cataract extraction Left 2005    Prior to Admission medications   Medication Sig Start Date End Date Taking? Authorizing Provider  acetaminophen (TYLENOL) 500 MG tablet Take 1,000 mg by mouth every 6 (six) hours as needed for moderate pain.   Yes Historical Provider, MD  alum & mag hydroxide-simeth (MAALOX/MYLANTA) 200-200-20 MG/5ML suspension Take 30 mLs by mouth every 4 (four) hours as needed for indigestion or heartburn (dyspepsia). 10/25/14  Yes Dustin Flock, MD  amLODipine (NORVASC) 5 MG tablet Take 1 tablet by mouth daily.  10/08/14  Yes Historical Provider, MD  CREON 6000 UNITS CPEP Take 1-3 capsules by mouth 3 (three) times daily before meals. Patient may take 1 capsule before snack in between meals 10/13/14  Yes Historical Provider, MD  docusate sodium (COLACE) 100 MG capsule Take 1 capsule (100 mg total) by mouth 2 (two) times daily. Patient taking differently: Take 100 mg by mouth 2 (two) times daily as needed for moderate constipation.  10/25/14  Yes Dustin Flock, MD  enoxaparin (LOVENOX) 80 MG/0.8ML injection Inject 0.7 mLs (70 mg total) into the skin every 12 (twelve) hours. As per Dr. Marylene Land schedule 11/05/14  Yes Evlyn Kanner, NP  metFORMIN (GLUCOPHAGE) 500 MG tablet Take 1 tablet by mouth daily. 10/18/14  Yes Historical Provider, MD  ondansetron (ZOFRAN-ODT) 8 MG disintegrating  tablet Take 8 mg by mouth every 8 (eight) hours as needed for nausea.  08/08/14  Yes Historical Provider, MD  oxyCODONE-acetaminophen (PERCOCET) 7.5-325 MG per tablet Take 1 tablet by mouth every 4 (four) hours as needed for severe pain. 10/25/14  Yes Dustin Flock, MD  polyethylene glycol (MIRALAX / GLYCOLAX) packet Take 17 g by mouth daily. 10/25/14  Yes Dustin Flock, MD  potassium chloride SA (K-DUR,KLOR-CON) 20 MEQ tablet Take 20 mEq by mouth daily.   Yes Historical Provider, MD  acetaminophen (TYLENOL) 325 MG tablet Take 2 tablets (650 mg total) by mouth every 6 (six) hours as needed for mild pain (or Fever >/= 101). Patient taking differently: Take 500 mg by mouth every 6 (six) hours as needed for mild pain (or Fever >/= 101).  10/20/14   Loletha Grayer, MD  senna-docusate (SENOKOT-S) 8.6-50 MG per tablet Take 1 tablet by mouth at bedtime as needed for mild constipation. 10/20/14   Loletha Grayer, MD    Allergies as of 11/08/2014 - Review Complete 11/04/2014  Allergen Reaction Noted  . Aspirin Other (See Comments) 10/19/2014    Family History  Problem Relation Age of Onset  . Hypertension Mother     History   Social History  . Marital Status: Divorced    Spouse Name: N/A  . Number of Children: N/A  . Years of Education: N/A   Occupational History  . Not on file.   Social History Main Topics  .  Smoking status: Never Smoker   . Smokeless tobacco: Never Used  . Alcohol Use: 0.0 oz/week    0 Standard drinks or equivalent per week     Comment: Infrequent use; special occasion;  . Drug Use: No  . Sexual Activity: No   Other Topics Concern  . Not on file   Social History Narrative    Review of Systems: See HPI, otherwise negative ROS  Physical Exam: BP 152/71 mmHg  Pulse 78  Temp(Src) 97.6 F (36.4 C) (Oral)  Resp 20  Ht 5\' 2"  (1.575 m) General:   Alert,  pleasant and cooperative in NAD Head:  Normocephalic and atraumatic. Neck:  Supple; no masses or  thyromegaly. Lungs:  Clear throughout to auscultation.    Heart:  Regular rate and rhythm. Abdomen:  Soft, nontender and nondistended. Normal bowel sounds, without guarding, and without rebound.   Neurologic:  Alert and  oriented x4;  grossly normal neurologically.  Impression/Plan: [name] is here for an Upper EUS to be performed for pancreatic mass.  Risks, benefits, limitations, and alternatives regarding Upper EUS have been reviewed with the patient.  Questions have been answered.  All parties agreeable.   Cora Daniels, MD  11/18/2014, 2:17 PM

## 2014-11-18 NOTE — Op Note (Signed)
[facility] Gastroenterology Patient Name: [name] Procedure Date: 11/18/2014 1:57 PM MRN: [id number] Account #: [id number] Date of Birth: 1928/12/28 Admit Type: Outpatient Age: 81 Room: [facility] ENDO ROOM 3 Gender: Female Note Status: Finalized Procedure:         Upper EUS Indications:       Suspected mass in pancreas on CT scan Providers:         Christian Mate. Earlie Counts Referring MD:      Barbette Reichmann, MD (Referring MD) Medicines:         Monitored Anesthesia Care Complications:     No immediate complications. Procedure:         Pre-Anesthesia Assessment:                    - Prior to the procedure, a History and Physical was                     performed, and patient medications, allergies and                     sensitivities were reviewed. The patient's tolerance of                     previous anesthesia was reviewed.                    - The risks and benefits of the procedure and the sedation                     options and risks were discussed with the patient. All                     questions were answered and informed consent was obtained.                    After obtaining informed consent, the endoscope was passed                     under direct vision. Throughout the procedure, the                     patient's blood pressure, pulse, and oxygen saturations                     were monitored continuously. The EUS GI Linear Array                     [id number] was introduced through the mouth, and advanced to                     the second part of duodenum. The Olympus GIF-160 endoscope                     (S#. [id number]) was introduced through the mouth, and                     advanced to the second part of duodenum. The upper EUS was                     accomplished without difficulty. The patient tolerated the                     procedure well. Findings:      Endoscopic Finding :  The esophagus and gastroesophageal junction were examined with  white       light and the Fujinon Intelligent Chromo Endoscopy (FICE) system. There       were esophageal mucosal changes consistent with long-segment Barrett's       esophagus, extending from the upper extent of the gastric folds which       were at 35 cm from the incisors to the Z-line which was at 20 cm from       the incisors. The maximum longitudinal extent of these esophageal       mucosal changes was 15 cm in length. Biopsies were taken with a cold       forceps for histology.      A medium-sized hiatus hernia was present.      The entire examined stomach was endoscopically normal.      A large diverticulum was found in the second part of the duodenum.      Endosonographic Finding :      An irregular mass/hypoechoic area was identified in the genu of the       pancreas. The mass was hypoechoic. The mass measured 37 mm by 21 mm in       maximal cross-sectional diameter. The outer margins were irregular.       There was sonographic evidence suggesting invasion into the portal vein       (manifested by abutment). The remainder of the pancreas was examined.       The endosonographic appearance of parenchyma and the upstream pancreatic       duct indicated a maximum duct diameter of 10 mm. Fine needle aspiration       was performed. Color Doppler imaging was utilized prior to needle       puncture to confirm a lack of significant vascular structures within the       needle path. Seven passes were made with the 22 gauge needle using a       transgastric approach and using a transduodenal approach. A stylet was       used. A cytologist was present and performed a preliminary cytologic       examination. Final cytology results are pending.      The pancreatic duct had a dilated endosonographic appearance, had a       prominently branched endosonographic appearance and had a       tortuous/ectatic appearance in the tail of the pancreas.      No abnormal-appearing lymph nodes were seen  during endosonographic       examination in the celiac region (level 20).      Endosonographic imaging in the left lobe of the liver showed no mass. Impression:        - Esophageal mucosal changes consistent with long-segment                     Barrett's esophagus. Biopsied.                    - Medium-sized hiatus hernia.                    - Duodenal diverticulum.                    - A mass/hypoechoic area was identified in the genu of the  pancreas. This was staged T3 N0 Mx by endosonographic                     criteria. The staging applies if malignancy is confirmed.                     Fine needle aspiration performed.                    - The pancreatic duct had a dilated endosonographic                     appearance, had a prominently branched endosonographic                     appearance and had a tortuous/ectatic appearance in the                     tail of the pancreas. The prominently dilated, almost                     cystic appearance of the duct is suggestive of IPMN. Recommendation:    - Await cytology results and await path results.                    - If pathology confirms Barrett's esophagus, recommend                     repeat EGD in 6-12 months for full endoscopic                     surveillance, if clinically appropriate.                    - Resume Lovenox (enoxaparin) at prior dose in 3 days.                     Refer to managing physician for further adjustment of                     therapy.                    - Use Prilosec (omeprazole) 20 mg PO daily, pending                     Barrett's biopsy results.                    - Patient has a contact number available for emergencies.                     The signs and symptoms of potential delayed complications                     were discussed with the patient. Return to normal                     activities tomorrow. Written discharge instructions were                     provided to  the patient.                    - Return to referring physician. Procedure Code(s): --- Professional ---  02585, 51, Esophagogastroduodenoscopy, flexible,                     transoral; with transendoscopic ultrasound-guided                     intramural or transmural fine needle aspiration/biopsy(s),                     (includes endoscopic ultrasound examination limited to the                     esophagus, stomach or duodenum, and adjacent structures)                    43239, 59, Esophagogastroduodenoscopy, flexible,                     transoral; with biopsy, single or multiple Diagnosis Code(s): --- Professional ---                    R93.3, Abnormal findings on diagnostic imaging of other                     parts of digestive tract                    K86.8, Other specified diseases of pancreas                    K57.10, Diverticulosis of small intestine without                     perforation or abscess without bleeding                    K44.9, Diaphragmatic hernia without obstruction or gangrene                    K22.9, Disease of esophagus, unspecified CPT copyright 2014 American Medical Association. All rights reserved. The codes documented in this report are preliminary and upon coder review may  be revised to meet current compliance requirements. Attending Participation:      I personally performed the entire procedure without the assistance of a       fellow, resident or surgical assistant. Lindie Spruce,  11/18/2014 4:10:50 PM This report has been signed electronically. Number of Addenda: 0 Note Initiated On: 11/18/2014 1:57 PM      [facility]

## 2014-11-18 NOTE — Transfer of Care (Signed)
Immediate Anesthesia Transfer of Care Note  Patient: [name]  Procedure(s) Performed: Procedure(s): ESOPHAGEAL ENDOSCOPIC ULTRASOUND (EUS) RADIAL (N/A)  Patient Location: PACU and Endoscopy Unit  Anesthesia Type:General  Level of Consciousness: sedated  Airway & Oxygen Therapy: Patient Spontanous Breathing and Patient connected to nasal cannula oxygen  Post-op Assessment: Report given to RN and Post -op Vital signs reviewed and stable  Post vital signs: Reviewed and stable  Last Vitals:  Filed Vitals:   11/18/14 1601  BP: 153/76  Pulse: 85  Temp: 35.8 C  Resp: 16    Complications: No apparent anesthesia complications

## 2014-11-19 ENCOUNTER — Encounter: Payer: Self-pay | Admitting: Gastroenterology

## 2014-11-19 NOTE — Anesthesia Postprocedure Evaluation (Signed)
  Anesthesia Post-op Note  Patient: [name]  Procedure(s) Performed: Procedure(s): ESOPHAGEAL ENDOSCOPIC ULTRASOUND (EUS) RADIAL (N/A)  Anesthesia type:General  Patient location: PACU  Post pain: Pain level controlled  Post assessment: Post-op Vital signs reviewed, Patient's Cardiovascular Status Stable, Respiratory Function Stable, Patent Airway and No signs of Nausea or vomiting  Post vital signs: Reviewed and stable  Last Vitals:  Filed Vitals:   11/18/14 1640  BP: 133/68  Pulse: 86  Temp:   Resp: 16    Level of consciousness: awake, alert  and patient cooperative  Complications: No apparent anesthesia complications

## 2014-11-22 ENCOUNTER — Inpatient Hospital Stay: Payer: Commercial Managed Care - HMO | Attending: Hematology and Oncology | Admitting: Hematology and Oncology

## 2014-11-22 ENCOUNTER — Ambulatory Visit: Payer: Commercial Managed Care - HMO | Admitting: Internal Medicine

## 2014-11-22 ENCOUNTER — Other Ambulatory Visit: Payer: Self-pay | Admitting: *Deleted

## 2014-11-22 VITALS — BP 122/75 | HR 80 | Temp 98.6°F | Resp 16 | Wt 151.0 lb

## 2014-11-22 DIAGNOSIS — K868 Other specified diseases of pancreas: Secondary | ICD-10-CM | POA: Diagnosis not present

## 2014-11-22 DIAGNOSIS — Z8619 Personal history of other infectious and parasitic diseases: Secondary | ICD-10-CM

## 2014-11-22 DIAGNOSIS — K2270 Barrett's esophagus without dysplasia: Secondary | ICD-10-CM

## 2014-11-22 DIAGNOSIS — R109 Unspecified abdominal pain: Secondary | ICD-10-CM

## 2014-11-22 DIAGNOSIS — Z90710 Acquired absence of both cervix and uterus: Secondary | ICD-10-CM

## 2014-11-22 DIAGNOSIS — R609 Edema, unspecified: Secondary | ICD-10-CM | POA: Diagnosis not present

## 2014-11-22 DIAGNOSIS — I10 Essential (primary) hypertension: Secondary | ICD-10-CM | POA: Diagnosis not present

## 2014-11-22 DIAGNOSIS — E119 Type 2 diabetes mellitus without complications: Secondary | ICD-10-CM | POA: Diagnosis not present

## 2014-11-22 DIAGNOSIS — Z79899 Other long term (current) drug therapy: Secondary | ICD-10-CM | POA: Diagnosis not present

## 2014-11-22 DIAGNOSIS — I81 Portal vein thrombosis: Secondary | ICD-10-CM

## 2014-11-22 DIAGNOSIS — D735 Infarction of spleen: Secondary | ICD-10-CM

## 2014-11-22 DIAGNOSIS — I82890 Acute embolism and thrombosis of other specified veins: Secondary | ICD-10-CM

## 2014-11-22 DIAGNOSIS — Z8781 Personal history of (healed) traumatic fracture: Secondary | ICD-10-CM | POA: Diagnosis not present

## 2014-11-22 DIAGNOSIS — K8689 Other specified diseases of pancreas: Secondary | ICD-10-CM

## 2014-11-22 NOTE — Progress Notes (Signed)
[facility] day:  11/22/2014  Chief Complaint: [name] is a 81 y.o. female with a pancreatic mass and extensive thrombosis of the portal vein, splenic vein, and superior mesenteric vein who is seen for assessment.  HPI: The patient initially presented in 07/2014 with abdominal pain.  Lipase was 1400.  Subsequently, she "never felt good".  She presented to [facility] on 10/19/2014 with abdominal pain. Abdominal and pelvic CT scan on 10/19/2014 revealed mild intrahepatic and extrahepatic biliary ductal dilatation. There was a mass in the pancreatic body obstructing the main pancreatic duct. There was complete thrombosis of the portal vein with extension into the intrahepatic portal vein, as well as complete thrombosis of the splenic vein and superior mesenteric vein. There was mild stranding of peripancreatic fat and fat surrounding mesenteric artery.  She underwent abdominal MRI on 10/21/2014. There was motion artifact on imaging. There was a thrombosis in the right and left portal vein, main portal vein, splenic vein and superior mesenteric vein. There was a 2.8 x 2.3 cm infiltrative pancreatic head mass causing stricture of the distal common bile duct. There was edema at the root of the mesentery.  Labs during her admission revealed a CBC with a hematocrit of 37.3, hemoglobin 12.1, platelets 426,000, white count 8600 with an ANC of 7000. Liver function tests were normal except for an albumin of 2.9. PT was 13.5 with an INR 1.01. Lipase was 134. CA-19-9 on 10/21/2014 was 329.  She was seen by Dr. Inez Pilgrim of medical oncology as well as gastroenterology. She underwent a hypercoagulable work-up.  Lupus anticoagulant and anticardiolipin antibodies were negative.  Protein C activity was 95% and protein C antigen was 52%.  Protein S activity was 93% and protein S antigen was 133%.  Factor V Leiden and prothrombin gene mutation were  negative.She was initially placed on heparin and then switched to Lovenox before discharge of 10/25/2014.  Anticoagulation was held.  She underwent endoscopic ultrasound and biopsy on 11/18/2014 Dr. Vida Roller. Findings revealed a long segment of Barrett's esophagus (15 cm).  Biopsies were taken. There was an irregular hypoechoic mass in genu of the pancreas measuring 3.7 x 2.1 cm.  Margins were irregular suggesting invasion into the portal vein. A fine needle aspirate was performed.  Endoscopic staging staging suggested a T2N0 lesion.  Past Medical History  Diagnosis Date  . Pancreatitis   . Hepatitis C   . Femur fracture   . H/O: hysterectomy   . History of cholecystectomy   . Hx of appendectomy   . Diabetes mellitus, type 2 11/01/2014  . Benign hypertension 11/01/2014    Past Surgical History  Procedure Laterality Date  . Total hip arthroplasty Left   . Abdominal hysterectomy    . Appendectomy    . Cholecystectomy    . Intracapsular cataract extraction Left 2005  . Eus N/A 11/18/2014    Procedure: ESOPHAGEAL ENDOSCOPIC ULTRASOUND (EUS) RADIAL;  Surgeon: Cora Daniels, MD;  Location: [facility] ENDOSCOPY;  Service: Endoscopy;  Laterality: N/A;    Family History  Problem Relation Age of Onset  . Hypertension Mother     Social History:  reports that she has never smoked. She has never used smokeless tobacco. She reports that she drinks alcohol. She reports that she does not use illicit drugs.  She is from [place].  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  The patient is accompanied by her daughter, [name].  Allergies:  Allergies  Allergen Reactions  . Aspirin Other (See Comments)    Stomach problems    Current Medications: Current Outpatient Prescriptions  Medication Sig Dispense Refill  . acetaminophen (TYLENOL) 500 MG tablet Take 1,000 mg by mouth every 6 (six) hours as needed for moderate pain.    Marland Kitchen alum & mag hydroxide-simeth (MAALOX/MYLANTA)  200-200-20 MG/5ML suspension Take 30 mLs by mouth every 4 (four) hours as needed for indigestion or heartburn (dyspepsia). 355 mL 0  . amLODipine (NORVASC) 5 MG tablet Take 1 tablet by mouth daily.     Marland Kitchen CREON 6000 UNITS CPEP Take 1-3 capsules by mouth 3 (three) times daily before meals. Patient may take 1 capsule before snack in between meals    . docusate sodium (COLACE) 100 MG capsule Take 1 capsule (100 mg total) by mouth 2 (two) times daily. (Patient taking differently: Take 100 mg by mouth 2 (two) times daily as needed for moderate constipation. ) 10 capsule 0  . enoxaparin (LOVENOX) 150 MG/ML injection     . enoxaparin (LOVENOX) 80 MG/0.8ML injection Inject 0.7 mLs (70 mg total) into the skin every 12 (twelve) hours. As per Dr. Marylene Land schedule 60 Syringe 1  . metFORMIN (GLUCOPHAGE) 500 MG tablet Take 1 tablet by mouth daily.    Marland Kitchen omeprazole (PRILOSEC) 20 MG capsule Take 1 capsule (20 mg total) by mouth daily. 30 capsule 2  . ondansetron (ZOFRAN-ODT) 8 MG disintegrating tablet Take 8 mg by mouth every 8 (eight) hours as needed for nausea.     . polyethylene glycol (MIRALAX / GLYCOLAX) packet Take 17 g by mouth daily. 14 each 0  . potassium chloride SA (K-DUR,KLOR-CON) 20 MEQ tablet Take 20 mEq by mouth daily.    Marland Kitchen oxyCODONE-acetaminophen (PERCOCET) 7.5-325 MG per tablet Take 1 tablet by mouth every 4 (four) hours as needed for severe pain. (Patient not taking: Reported on 11/22/2014) 30 tablet 0   No current facility-administered medications for this visit.    Review of Systems:  GENERAL:  Feels good.  Active.  No fevers or sweats. Some unspecified weight loss. PERFORMANCE STATUS (ECOG):  1 HEENT:  No visual changes, runny nose, sore throat, mouth sores or tenderness. Lungs: No shortness of breath or cough.  No hemoptysis. Cardiac:  No chest pain, palpitations, orthopnea, or PND. GI:  No abdominal pain.  Constipation.  No nausea, vomiting, diarrhea, melena or hematochezia. GU:  No  urgency, frequency, dysuria, or hematuria. Musculoskeletal:  No back pain.  No joint pain.  No muscle tenderness. Extremities:  No pain or swelling. Skin:  No rashes or skin changes. Neuro:  No headache, numbness or weakness, balance or coordination issues. Endocrine:  No diabetes, thyroid issues, hot flashes or night sweats. Psych:  No mood changes, depression or anxiety. Pain:  No focal pain. Review of systems:  All other systems reviewed and found to be negative.   Physical Exam: Blood pressure 122/75, pulse 80, temperature 98.6 F (37 C), temperature source Tympanic, resp. rate 16, weight 151 lb 0.2 oz (68.5 kg), SpO2 97 %.  GENERAL:  Well developed, well nourished, sitting comfortably in the exam room in no acute distress. MENTAL STATUS:  Alert and oriented to person, place and time. HEAD:  Pearline Cables short hair.  Normocephalic, atraumatic, face symmetric, no Cushingoid features. EYES:  Blue eyes.  Pupils equal round and reactive to light and accomodation.  No conjunctivitis or scleral icterus. ENT:  Oropharynx clear without lesion.  Tongue normal. Mucous membranes moist.  RESPIRATORY:  Clear to auscultation without rales, wheezes or rhonchi. CARDIOVASCULAR:  Regular rate and rhythm without murmur, rub or gallop. ABDOMEN:  Soft, non-tender, with active bowel sounds, and no hepatosplenomegaly.  No masses. SKIN:  No rashes, ulcers or lesions. EXTREMITIES: Trace ankle edema.  No skin discoloration or tenderness.  No palpable cords. LYMPH NODES: No palpable cervical, supraclavicular, axillary or inguinal adenopathy  NEUROLOGICAL: Unremarkable. PSYCH:  Appropriate.   No visits with results within 3 Day(s) from this visit. Latest known visit with results is:  Clinical Support on 11/11/2014  Component Date Value Ref Range Status  . Potassium 11/11/2014 4.2  3.5 - 5.1 mmol/L Final  . Magnesium 11/11/2014 2.0  1.7 - 2.4 mg/dL Final    Assessment:  [name] is an 81 y.o. female with  a pancreatic mass and extensive thrombosis of the portal vein, splenic and mesenteric vein presenting with abdominal pain.    Abdominal MRI on 10/21/2014 revealed thrombosis in the right and left portal vein, main portal vein, splenic vein and superior mesenteric vein. There was a 2.8 x 2.3 cm infiltrative pancreatic head mass causing stricture of the distal common bile duct. There was edema at the root of the mesentery.  CA 19-9 was 329 on 10/21/2014.  Endoscopic ultrasound and biopsy on 11/18/2014 revealed a long segment of Barrett's esophagus (15 cm).  Biopsies were taken. There was an irregular hypoechoic mass in genu of the pancreas measuring 3.7 x 2.1 cm.  Margins were irregular suggesting invasion into the portal vein. A fine needle aspirate was performed.  Endoscopic staging staging suggested a T2N0 lesion.  Symptomatically, she is doing well.  She denies any abdominal pain.  She is on Lovenox BID.  Plan: 1. Review medical history, imaging studies, findings of pancreatic mass and thrombosis. 2. Present at tumor board on 11/25/2014. 3. Await FNA results. 4. Discuss plan for chest CT if biopsy confirms pancreatic cancer. 5. RTC on 12/01/2014 to finalize treatment plan.  Addendum:  Cytology from FNA of pancreatic mass revealed scant fragments of mucinous epithelium with minimal atypia.  Lequita Asal, MD  11/22/2014, 6:30 PM

## 2014-11-22 NOTE — Patient Outreach (Signed)
[facility]) Care Management  11/22/2014  Adamary Savary [birth year] [id number]   Phone call to patient to provide community resources and assistance with transportation and Advanced Directive.  Spoke with patient's daughter who requested that this CSW contact patient on 11/23/14 at 11:00 am.   [place], [place] Management [phone]

## 2014-11-25 ENCOUNTER — Other Ambulatory Visit: Payer: Self-pay | Admitting: *Deleted

## 2014-11-25 LAB — SURGICAL PATHOLOGY

## 2014-11-25 NOTE — Patient Outreach (Signed)
[facility]) Care Management  11/25/2014  [name] [birth year] [id number]  Phone call to patient's daughter to discuss transportation needs.  Per patient's daughter, she is the main one providing transportation to her doctor's appointments.  If she is not available, patient's grandson or granddaughter can provide transportation as well.  Per patient's daughter, patient's appointments have slowed down now and she has no transportation needs at this time.  CSW's contact information given for future social work needs. Case to be closed to social work.   Sheralyn Boatman [facility] Care Management [phone]

## 2014-11-26 ENCOUNTER — Telehealth: Payer: Self-pay

## 2014-11-26 ENCOUNTER — Other Ambulatory Visit: Payer: Self-pay | Admitting: Hematology and Oncology

## 2014-11-26 DIAGNOSIS — K8689 Other specified diseases of pancreas: Secondary | ICD-10-CM

## 2014-11-26 LAB — CYTOLOGY - NON PAP

## 2014-11-26 NOTE — Telephone Encounter (Signed)
Oncology Nurse Navigator Documentation  Oncology Nurse Navigator Flowsheets 11/05/2014 11/05/2014 11/11/2014 11/11/2014 11/17/2014 11/26/2014 11/26/2014  Referral date to RadOnc/MedOnc 11/04/2014 - - - - - -  Navigator Encounter Type Initial MedOnc Education Telephone Telephone Telephone Telephone -  Patient Visit Type Medonc - - - - - -  Treatment Phase Other - - - - - -  Barriers/Navigation Needs No barriers at this time - - - - - -  Interventions Education Method Education Method - - - - Coordination of Care  Coordination of Care Other EUS - MD Appointments - - -  Education Method Teach-back Teach-back - - - - -  Time Spent with Patient 120 60 15 15 15 30 30    Coordinating care at [facility] in [place]. Have spoken with [name] on the phone. She agrees with appt. [facility] will be contacting her for appt

## 2014-11-29 ENCOUNTER — Other Ambulatory Visit: Payer: Self-pay

## 2014-11-29 NOTE — Patient Outreach (Signed)
[facility]) Care Management  11/29/2014  Deniyah Dillavou [birth year] [id number]   RN CM placed call to [place].  Jeani Hawking has been seeing patient according to family member, August Luz twice a week for disease management.  RN CM left voice mail message to [place] for update on patient's progress and to inquire when her last home will be.  From update information RN CM will inquire if this patient will need further support from [facility] case management/ disease management. Return call back number left for update.  Maury Dus, RN, Ishmael Holter, [place] Telephonic Care Coordinator [phone]

## 2014-12-01 ENCOUNTER — Other Ambulatory Visit: Payer: Commercial Managed Care - HMO

## 2014-12-01 ENCOUNTER — Ambulatory Visit: Payer: Commercial Managed Care - HMO | Admitting: Hematology and Oncology

## 2014-12-01 NOTE — Telephone Encounter (Signed)
She said today was not a NS. Dr. Mike Gip told her there was no need for them to come today bc bx was inconclusive. She wants to know when they should RTC.

## 2014-12-01 NOTE — Telephone Encounter (Signed)
Called to r/s NS

## 2014-12-01 NOTE — Telephone Encounter (Signed)
  We should see her in follow-up after her PET scan. Do you know when that was scheduled?  We will need to see her after she goes to the biliary clinic at [facility].  M

## 2014-12-02 NOTE — Telephone Encounter (Signed)
Per Dr. Mike Gip -Please schedule follow up appointment with her once PET scan is complete and she has been to the biliary clinic at [facility]; pt should call  To schedule her appointment with Dr. Mike Gip once she knows when those are scheduled

## 2014-12-03 ENCOUNTER — Encounter: Payer: Self-pay | Admitting: Hematology and Oncology

## 2014-12-03 ENCOUNTER — Telehealth: Payer: Self-pay

## 2014-12-03 ENCOUNTER — Ambulatory Visit: Payer: Commercial Managed Care - HMO

## 2014-12-03 NOTE — Telephone Encounter (Signed)
Oncology Nurse Navigator Documentation  Oncology Nurse Navigator Flowsheets 11/05/2014 11/11/2014 11/11/2014 11/17/2014 11/26/2014 11/26/2014 12/03/2014  Referral date to RadOnc/MedOnc - - - - - - -  Navigator Encounter Type Education Telephone Telephone Telephone Telephone - Telephone  Patient Visit Type - - - - - - -  Treatment Phase - - - - - - -  Barriers/Navigation Needs - - - - - - -  Interventions Education Method - - - - Coordination of Care Coordination of Care  Coordination of Care EUS - MD Appointments - - - -  Education Method Teach-back - - - - - -  Time Spent with Patient 44 15 15 15 30 30 30    Being seen in [facility] 7/6. Will arrange follow up appt with Dr Mike Gip following

## 2014-12-07 ENCOUNTER — Other Ambulatory Visit: Payer: Self-pay

## 2014-12-07 NOTE — Patient Outreach (Signed)
[facility]) Care Management  12/07/2014  Jasper Ruminski 08/24/1928 [id number]  RN CM called to speak with patient's daughter Ree Kida, who is health care power of attorney for patient.  Unable to reach Mrs. Schecterman to see further if [facility] services were needed.  [facility] social worker has engaged patient and has resolved social work issues.  Patient still working with Marga Hoots, nurse from [place] care agency.  RN CM left a HIPPA compliant voice mail message with return call back number.  If no response from caregiver then RN CM will close this case and notify primary physician.  Maury Dus, RN, Ishmael Holter, [place] [facility] Telephonic Care Coordinator [phone]

## 2014-12-10 ENCOUNTER — Ambulatory Visit
Admission: RE | Admit: 2014-12-10 | Discharge: 2014-12-10 | Disposition: A | Discharge status: Home / Self Care | Payer: Commercial Managed Care - HMO | Source: Ambulatory Visit | Attending: Hematology and Oncology | Admitting: Hematology and Oncology

## 2014-12-10 DIAGNOSIS — R918 Other nonspecific abnormal finding of lung field: Secondary | ICD-10-CM | POA: Diagnosis not present

## 2014-12-10 DIAGNOSIS — I81 Portal vein thrombosis: Secondary | ICD-10-CM | POA: Insufficient documentation

## 2014-12-10 DIAGNOSIS — K869 Disease of pancreas, unspecified: Secondary | ICD-10-CM | POA: Diagnosis present

## 2014-12-10 DIAGNOSIS — K8689 Other specified diseases of pancreas: Secondary | ICD-10-CM

## 2014-12-10 LAB — GLUCOSE, CAPILLARY: Glucose-Capillary: 196 mg/dL — ABNORMAL HIGH (ref 65–99)

## 2014-12-10 MED ORDER — FLUDEOXYGLUCOSE F - 18 (FDG) INJECTION
12.3900 | Freq: Once | INTRAVENOUS | Status: AC | PRN
  Administered 2014-12-10: 12.39 via INTRAVENOUS

## 2014-12-15 ENCOUNTER — Telehealth: Payer: Self-pay

## 2014-12-15 NOTE — Telephone Encounter (Signed)
  Oncology Nurse Navigator Documentation    Navigator Encounter Type: Telephone (12/15/14 1400)             Time Spent with Patient: 30 (12/15/14 1400) Daughter had questions regarding PET results and to inquire if scans were sent to [facility] clinic

## 2014-12-17 ENCOUNTER — Telehealth: Payer: Self-pay | Admitting: *Deleted

## 2014-12-17 NOTE — Telephone Encounter (Signed)
Called her back and explained that we do not automatically call pt with results, that is why we schedule a follow up appt. To go over results in person. She apologized stating she did not realize that

## 2014-12-22 NOTE — Patient Outreach (Signed)
[facility]) Care Management  12/22/2014  Meenakshi Sazama [birth year] [id number]   RN CM will close this case as patient is still working with [facility].  Patient is also being assessed by oncology department with recent scans related to pancreatic mass.  RN CM will notify primary doctor of case closure.  Maury Dus, RN, Ishmael Holter, [place] Telephonic Care Coordinator [phone]

## 2014-12-23 ENCOUNTER — Emergency Department
Admission: EM | Admit: 2014-12-23 | Discharge: 2014-12-24 | Disposition: A | Discharge status: Home / Self Care | Payer: Commercial Managed Care - HMO | Source: Home / Self Care | Attending: Emergency Medicine | Admitting: Emergency Medicine

## 2014-12-23 ENCOUNTER — Encounter: Payer: Self-pay | Admitting: Emergency Medicine

## 2014-12-23 ENCOUNTER — Emergency Department: Payer: Commercial Managed Care - HMO

## 2014-12-23 DIAGNOSIS — E119 Type 2 diabetes mellitus without complications: Secondary | ICD-10-CM

## 2014-12-23 DIAGNOSIS — Z79899 Other long term (current) drug therapy: Secondary | ICD-10-CM | POA: Insufficient documentation

## 2014-12-23 DIAGNOSIS — Z90710 Acquired absence of both cervix and uterus: Secondary | ICD-10-CM

## 2014-12-23 DIAGNOSIS — I10 Essential (primary) hypertension: Secondary | ICD-10-CM | POA: Insufficient documentation

## 2014-12-23 DIAGNOSIS — M62838 Other muscle spasm: Secondary | ICD-10-CM

## 2014-12-23 DIAGNOSIS — Z9049 Acquired absence of other specified parts of digestive tract: Secondary | ICD-10-CM | POA: Diagnosis present

## 2014-12-23 DIAGNOSIS — M7981 Nontraumatic hematoma of soft tissue: Principal | ICD-10-CM | POA: Diagnosis present

## 2014-12-23 DIAGNOSIS — M79652 Pain in left thigh: Secondary | ICD-10-CM | POA: Diagnosis present

## 2014-12-23 DIAGNOSIS — I81 Portal vein thrombosis: Secondary | ICD-10-CM | POA: Diagnosis present

## 2014-12-23 DIAGNOSIS — Z888 Allergy status to other drugs, medicaments and biological substances status: Secondary | ICD-10-CM

## 2014-12-23 DIAGNOSIS — B1920 Unspecified viral hepatitis C without hepatic coma: Secondary | ICD-10-CM | POA: Diagnosis present

## 2014-12-23 DIAGNOSIS — K5900 Constipation, unspecified: Secondary | ICD-10-CM | POA: Diagnosis present

## 2014-12-23 DIAGNOSIS — K2270 Barrett's esophagus without dysplasia: Secondary | ICD-10-CM | POA: Diagnosis present

## 2014-12-23 DIAGNOSIS — Z79891 Long term (current) use of opiate analgesic: Secondary | ICD-10-CM

## 2014-12-23 DIAGNOSIS — K869 Disease of pancreas, unspecified: Secondary | ICD-10-CM | POA: Diagnosis present

## 2014-12-23 DIAGNOSIS — M62831 Muscle spasm of calf: Secondary | ICD-10-CM

## 2014-12-23 DIAGNOSIS — Z7901 Long term (current) use of anticoagulants: Secondary | ICD-10-CM

## 2014-12-23 DIAGNOSIS — D649 Anemia, unspecified: Secondary | ICD-10-CM | POA: Diagnosis present

## 2014-12-23 DIAGNOSIS — R52 Pain, unspecified: Secondary | ICD-10-CM | POA: Diagnosis not present

## 2014-12-23 NOTE — ED Notes (Signed)
[place],MD consulted. MD made aware of presenting complaints and triage assessment. MD with [facility] for CBC, BMP, PT/INR, PTT and an U/S of patient LLE to r/o DVT. Orders to be entered and carried by this RN.

## 2014-12-23 NOTE — ED Notes (Signed)
Pt to ER via EMS from home with c/o left leg swelling and pain.  Pt denies injury

## 2014-12-24 ENCOUNTER — Emergency Department: Payer: Commercial Managed Care - HMO

## 2014-12-24 LAB — CBC
HEMATOCRIT: 34.4 % — AB (ref 35.0–47.0)
Hemoglobin: 11.3 g/dL — ABNORMAL LOW (ref 12.0–16.0)
MCH: 31.2 pg (ref 26.0–34.0)
MCHC: 32.9 g/dL (ref 32.0–36.0)
MCV: 94.9 fL (ref 80.0–100.0)
PLATELETS: 179 10*3/uL (ref 150–440)
RBC: 3.63 MIL/uL — ABNORMAL LOW (ref 3.80–5.20)
RDW: 15.1 % — ABNORMAL HIGH (ref 11.5–14.5)
WBC: 5.9 10*3/uL (ref 3.6–11.0)

## 2014-12-24 LAB — BASIC METABOLIC PANEL
ANION GAP: 10 (ref 5–15)
BUN: 18 mg/dL (ref 6–20)
CALCIUM: 9.1 mg/dL (ref 8.9–10.3)
CO2: 23 mmol/L (ref 22–32)
Chloride: 106 mmol/L (ref 101–111)
Creatinine, Ser: 0.45 mg/dL (ref 0.44–1.00)
GFR calc Af Amer: >60 mL/min (ref >60)
GFR calc non Af Amer: >60 mL/min (ref >60)
Glucose, Bld: 190 mg/dL — ABNORMAL HIGH (ref 65–99)
POTASSIUM: 3.7 mmol/L (ref 3.5–5.1)
Sodium: 139 mmol/L (ref 135–145)

## 2014-12-24 LAB — PROTIME-INR
INR: 1.08
Prothrombin Time: 14.2 seconds (ref 11.4–15.0)

## 2014-12-24 LAB — CK: CK TOTAL: 82 U/L (ref 38–234)

## 2014-12-24 LAB — APTT: APTT: 42 s — AB (ref 24–36)

## 2014-12-24 MED ORDER — ONDANSETRON HCL 4 MG/2ML IJ SOLN
4.0000 mg | Freq: Once | INTRAMUSCULAR | Status: AC
  Administered 2014-12-24: 4 mg via INTRAVENOUS
  Filled 2014-12-24: qty 2

## 2014-12-24 MED ORDER — MORPHINE SULFATE 4 MG/ML IJ SOLN
4.0000 mg | Freq: Once | INTRAMUSCULAR | Status: AC
  Administered 2014-12-24: 4 mg via INTRAVENOUS
  Filled 2014-12-24: qty 1

## 2014-12-24 MED ORDER — FENTANYL CITRATE (PF) 100 MCG/2ML IJ SOLN
50.0000 ug | Freq: Once | INTRAMUSCULAR | Status: AC
  Administered 2014-12-24: 50 ug via INTRAVENOUS

## 2014-12-24 MED ORDER — DIAZEPAM 5 MG/ML IJ SOLN
2.0000 mg | Freq: Once | INTRAMUSCULAR | Status: AC
  Administered 2014-12-24: 2 mg via INTRAVENOUS
  Filled 2014-12-24: qty 2

## 2014-12-24 MED ORDER — FENTANYL CITRATE (PF) 100 MCG/2ML IJ SOLN
INTRAMUSCULAR | Status: AC
  Administered 2014-12-24: 50 ug via INTRAVENOUS
  Filled 2014-12-24: qty 2

## 2014-12-24 MED ORDER — KETOROLAC TROMETHAMINE 30 MG/ML IJ SOLN
30.0000 mg | Freq: Once | INTRAMUSCULAR | Status: AC
  Administered 2014-12-24: 30 mg via INTRAVENOUS
  Filled 2014-12-24: qty 1

## 2014-12-24 NOTE — ED Provider Notes (Signed)
[facility] Emergency Department Provider Note  ____________________________________________  Time seen: Approximately 4081 PM  I have reviewed the triage vital signs and the nursing notes.   HISTORY  Chief Complaint Leg Swelling    HPI [name] is a 81 y.o. female with a history of pancreatitis and a pancreatic mass who presents today with left thigh pain. She says that she was walking through the [facility] clinic's for hours yesterday and had increasing pain today with a palpable swelling to the left side of her thigh. She is currently undergoing a workup for a pancreatic mass which is thought to be cancer but had an indeterminant FNA as well as PET scan. However, the patient is on Lovenox as an outpatient for multiple abdominal venous clots. She denies any nausea vomiting diarrhea or belly pain at this time. Says that her left leg pain is constant but worse with movement. No fever.   Past Medical History  Diagnosis Date  . Pancreatitis   . Hepatitis C   . Femur fracture   . H/O: hysterectomy   . History of cholecystectomy   . Hx of appendectomy   . Diabetes mellitus, type 2 11/01/2014  . Benign hypertension 11/01/2014    Patient Active Problem List   Diagnosis Date Noted  . Acute inflammation of the pancreas 11/01/2014  . Cataract 11/01/2014  . Chronic constipation 11/01/2014  . Diabetes mellitus, type 2 11/01/2014  . External hemorrhoids without complication 44/81/8563  . Arthralgia of hip 11/01/2014  . Personal history of infectious and parasitic disease 11/01/2014  . Benign hypertension 11/01/2014  . Decreased potassium in the blood 11/01/2014  . Pancreas (digestive gland) works poorly 11/01/2014  . Deep vein thrombosis of splenic vein 11/01/2014  . Acute pancreatitis   . Portal vein thrombosis   . Nausea with vomiting   . Abdominal pain, epigastric   . Pancreatic mass 10/19/2014    Past Surgical History  Procedure Laterality Date  .  Total hip arthroplasty Left   . Abdominal hysterectomy    . Appendectomy    . Cholecystectomy    . Intracapsular cataract extraction Left 2005  . Eus N/A 11/18/2014    Procedure: ESOPHAGEAL ENDOSCOPIC ULTRASOUND (EUS) RADIAL;  Surgeon: Cora Daniels, MD;  Location: [facility] ENDOSCOPY;  Service: Endoscopy;  Laterality: N/A;    Current Outpatient Rx  Name  Route  Sig  Dispense  Refill  . acetaminophen (TYLENOL) 500 MG tablet   Oral   Take 1,000 mg by mouth every 6 (six) hours as needed for moderate pain.         Marland Kitchen alum & mag hydroxide-simeth (MAALOX/MYLANTA) 200-200-20 MG/5ML suspension   Oral   Take 30 mLs by mouth every 4 (four) hours as needed for indigestion or heartburn (dyspepsia).   355 mL   0   . docusate sodium (COLACE) 100 MG capsule   Oral   Take 1 capsule (100 mg total) by mouth 2 (two) times daily. Patient taking differently: Take 100 mg by mouth 2 (two) times daily as needed for moderate constipation.    10 capsule   0   . enoxaparin (LOVENOX) 80 MG/0.8ML injection   Subcutaneous   Inject 0.7 mLs (70 mg total) into the skin every 12 (twelve) hours. As per Dr. Marylene Land schedule   60 Syringe   1   . lipase/protease/amylase (CREON) 12000 UNITS CPEP capsule   Oral   Take 36,000 Units by mouth 3 (three) times daily with meals.         Marland Kitchen  metFORMIN (GLUCOPHAGE) 500 MG tablet   Oral   Take 1 tablet by mouth daily.         . ondansetron (ZOFRAN-ODT) 8 MG disintegrating tablet   Oral   Take 8 mg by mouth every 8 (eight) hours as needed for nausea.          Marland Kitchen oxyCODONE-acetaminophen (PERCOCET) 7.5-325 MG per tablet   Oral   Take 1 tablet by mouth every 4 (four) hours as needed for severe pain.   30 tablet   0   . polyethylene glycol (MIRALAX / GLYCOLAX) packet   Oral   Take 17 g by mouth daily.   14 each   0   . potassium chloride SA (K-DUR,KLOR-CON) 20 MEQ tablet   Oral   Take 20 mEq by mouth daily.         . ranitidine (ZANTAC) 150 MG  tablet   Oral   Take 150 mg by mouth 2 (two) times daily.         Marland Kitchen amLODipine (NORVASC) 5 MG tablet   Oral   Take 1 tablet by mouth daily.          Marland Kitchen CREON 6000 UNITS CPEP   Oral   Take 1-3 capsules by mouth 3 (three) times daily before meals. Patient may take 1 capsule before snack in between meals           Dispense as written.   . enoxaparin (LOVENOX) 150 MG/ML injection               . omeprazole (PRILOSEC) 20 MG capsule   Oral   Take 1 capsule (20 mg total) by mouth daily.   30 capsule   2     Allergies Aspirin and Omeprazole  Family History  Problem Relation Age of Onset  . Hypertension Mother     Social History History  Substance Use Topics  . Smoking status: Never Smoker   . Smokeless tobacco: Never Used  . Alcohol Use: 0.0 oz/week    0 Standard drinks or equivalent per week     Comment: Infrequent use; special occasion;    Review of Systems Constitutional: No fever/chills Eyes: No visual changes. ENT: No sore throat. Cardiovascular: Denies chest pain. Respiratory: Denies shortness of breath. Gastrointestinal: No abdominal pain.  No nausea, no vomiting.  No diarrhea.  No constipation. Genitourinary: Negative for dysuria. Musculoskeletal: Negative for back pain. Skin: Negative for rash. Neurological: Negative for headaches, focal weakness or numbness.  10-point ROS otherwise negative.  ____________________________________________   PHYSICAL EXAM:  VITAL SIGNS: ED Triage Vitals  Enc Vitals Group     BP 12/23/14 2244 164/68 mmHg     Pulse Rate 12/23/14 2244 89     Resp 12/23/14 2244 18     Temp 12/23/14 2244 98.3 F (36.8 C)     Temp src --      SpO2 12/23/14 2244 96 %     Weight 12/23/14 2244 148 lb (67.132 kg)     Height 12/23/14 2244 5\' 2"  (1.575 m)     Head Cir --      Peak Flow --      Pain Score 12/23/14 2246 3     Pain Loc --      Pain Edu? --      Excl. in [place]? --     Constitutional: Alert and oriented. Well  appearing and in no acute distress. Eyes: Conjunctivae are normal. PERRL. EOMI. Head: Atraumatic. Nose: No congestion/rhinnorhea.  Mouth/Throat: Mucous membranes are moist.  Oropharynx non-erythematous. Neck: No stridor.   Cardiovascular: Normal rate, regular rhythm. Grossly normal heart sounds.  Good peripheral circulation with equal and bilaterally intact dorsalis pedis pulses. Respiratory: Normal respiratory effort.  No retractions. Lungs CTAB. Gastrointestinal: Soft and nontender. No distention. No abdominal bruits. No CVA tenderness. Musculoskeletal: No lower extremity edema.  No joint effusions. Left lateral 5 with a tender mass which is about 4 x 6". Non pulsatile.  No induration, color change or warmth. Pain over the mass when knee and hip arranged. No ligamentous laxity to the knee. No shortening of the leg or rotational deformity. Neurologic:  Normal speech and language. No gross focal neurologic deficits are appreciated. Speech is normal. No gait instability. Skin:  Skin is warm, dry and intact. No rash noted. Psychiatric: Mood and affect are normal. Speech and behavior are normal.  ____________________________________________   LABS (all labs ordered are listed, but only abnormal results are displayed)  Labs Reviewed  CBC - Abnormal; Notable for the following:    RBC 3.63 (*)    Hemoglobin 11.3 (*)    HCT 34.4 (*)    RDW 15.1 (*)    All other components within normal limits  BASIC METABOLIC PANEL - Abnormal; Notable for the following:    Glucose, Bld 190 (*)    All other components within normal limits  APTT - Abnormal; Notable for the following:    aPTT 42 (*)    All other components within normal limits  PROTIME-INR  CK   ____________________________________________  EKG   ____________________________________________  RADIOLOGY  Ultrasound of the lower extremities negative for DVT and x-ray is negative for acute process. I personally reviewed the x-ray of the  left femur. ____________________________________________   PROCEDURES    ____________________________________________   INITIAL IMPRESSION / ASSESSMENT AND PLAN / ED COURSE  Pertinent labs & imaging results that were available during my care of the patient were reviewed by me and considered in my medical decision making (see chart for details).  Possible muscle spasm to the left leg. However given patient's history of possible pancreatic cancer will rule out pathologic fracture as well as DVT.  ----------------------------------------- 3:59 AM on 12/24/2014 -----------------------------------------  Patient requiring multiple pain medications however now is feeling better and the mass is reduced. It is less tender.palpation. Patient is able to ambulate with assistance. Has a walker and a cane at home and the daughter will also help her ambulate as needed. She has 7.5 mg oxycodone at home for pain control. Advised the patient as well as the daughter to make sure to ambulate as much as possible so that the spasm will be reduced. We'll also use icy hot or Aspercreme to the area. To discharge to home. ____________________________________________   FINAL CLINICAL IMPRESSION(S) / ED DIAGNOSES  Acute muscle spasm. Initial visit.    Orbie Pyo, MD 12/24/14 0400

## 2014-12-24 NOTE — ED Notes (Signed)
Patient transported to X-ray 

## 2014-12-26 ENCOUNTER — Encounter: Payer: Self-pay | Admitting: Emergency Medicine

## 2014-12-26 ENCOUNTER — Emergency Department: Payer: Commercial Managed Care - HMO

## 2014-12-26 ENCOUNTER — Inpatient Hospital Stay
Admission: EM | Admit: 2014-12-26 | Discharge: 2014-12-29 | DRG: 555 | Disposition: A | Discharge status: Skilled Nursing Facility | Payer: Commercial Managed Care - HMO | Attending: Internal Medicine | Admitting: Internal Medicine

## 2014-12-26 DIAGNOSIS — E119 Type 2 diabetes mellitus without complications: Secondary | ICD-10-CM | POA: Diagnosis present

## 2014-12-26 DIAGNOSIS — B1920 Unspecified viral hepatitis C without hepatic coma: Secondary | ICD-10-CM | POA: Diagnosis present

## 2014-12-26 DIAGNOSIS — M7981 Nontraumatic hematoma of soft tissue: Secondary | ICD-10-CM | POA: Diagnosis present

## 2014-12-26 DIAGNOSIS — D62 Acute posthemorrhagic anemia: Secondary | ICD-10-CM | POA: Diagnosis present

## 2014-12-26 DIAGNOSIS — Z888 Allergy status to other drugs, medicaments and biological substances status: Secondary | ICD-10-CM | POA: Diagnosis not present

## 2014-12-26 DIAGNOSIS — D649 Anemia, unspecified: Secondary | ICD-10-CM | POA: Diagnosis present

## 2014-12-26 DIAGNOSIS — K5900 Constipation, unspecified: Secondary | ICD-10-CM | POA: Diagnosis present

## 2014-12-26 DIAGNOSIS — I10 Essential (primary) hypertension: Secondary | ICD-10-CM | POA: Diagnosis present

## 2014-12-26 DIAGNOSIS — Z79891 Long term (current) use of opiate analgesic: Secondary | ICD-10-CM | POA: Diagnosis not present

## 2014-12-26 DIAGNOSIS — I81 Portal vein thrombosis: Secondary | ICD-10-CM | POA: Diagnosis present

## 2014-12-26 DIAGNOSIS — Z90710 Acquired absence of both cervix and uterus: Secondary | ICD-10-CM | POA: Diagnosis not present

## 2014-12-26 DIAGNOSIS — Z79899 Other long term (current) drug therapy: Secondary | ICD-10-CM | POA: Diagnosis not present

## 2014-12-26 DIAGNOSIS — T148XXA Other injury of unspecified body region, initial encounter: Secondary | ICD-10-CM

## 2014-12-26 DIAGNOSIS — K869 Disease of pancreas, unspecified: Secondary | ICD-10-CM | POA: Diagnosis present

## 2014-12-26 DIAGNOSIS — R52 Pain, unspecified: Secondary | ICD-10-CM | POA: Diagnosis present

## 2014-12-26 DIAGNOSIS — M79652 Pain in left thigh: Secondary | ICD-10-CM | POA: Diagnosis present

## 2014-12-26 DIAGNOSIS — K2270 Barrett's esophagus without dysplasia: Secondary | ICD-10-CM | POA: Diagnosis present

## 2014-12-26 DIAGNOSIS — Z7901 Long term (current) use of anticoagulants: Secondary | ICD-10-CM | POA: Diagnosis not present

## 2014-12-26 DIAGNOSIS — Z9049 Acquired absence of other specified parts of digestive tract: Secondary | ICD-10-CM | POA: Diagnosis present

## 2014-12-26 LAB — COMPREHENSIVE METABOLIC PANEL
ALT: 30 U/L (ref 14–54)
ANION GAP: 9 (ref 5–15)
AST: 45 U/L — ABNORMAL HIGH (ref 15–41)
Albumin: 3.3 g/dL — ABNORMAL LOW (ref 3.5–5.0)
Alkaline Phosphatase: 47 U/L (ref 38–126)
BUN: 17 mg/dL (ref 6–20)
CALCIUM: 8.8 mg/dL — AB (ref 8.9–10.3)
CO2: 24 mmol/L (ref 22–32)
Chloride: 102 mmol/L (ref 101–111)
Creatinine, Ser: 0.47 mg/dL (ref 0.44–1.00)
GLUCOSE: 200 mg/dL — AB (ref 65–99)
Potassium: 3.9 mmol/L (ref 3.5–5.1)
SODIUM: 135 mmol/L (ref 135–145)
Total Bilirubin: 0.8 mg/dL (ref 0.3–1.2)
Total Protein: 6.6 g/dL (ref 6.5–8.1)

## 2014-12-26 LAB — CBC WITH DIFFERENTIAL/PLATELET
BASOS PCT: 0 %
Basophils Absolute: 0 10*3/uL (ref 0–0.1)
EOS ABS: 0 10*3/uL (ref 0–0.7)
Eosinophils Relative: 1 %
HCT: 30.1 % — ABNORMAL LOW (ref 35.0–47.0)
Hemoglobin: 9.8 g/dL — ABNORMAL LOW (ref 12.0–16.0)
Lymphocytes Relative: 7 %
Lymphs Abs: 0.6 10*3/uL — ABNORMAL LOW (ref 1.0–3.6)
MCH: 30.8 pg (ref 26.0–34.0)
MCHC: 32.4 g/dL (ref 32.0–36.0)
MCV: 95 fL (ref 80.0–100.0)
MONO ABS: 0.9 10*3/uL (ref 0.2–0.9)
Monocytes Relative: 11 %
NEUTROS ABS: 6.9 10*3/uL — AB (ref 1.4–6.5)
Neutrophils Relative %: 81 %
Platelets: 239 10*3/uL (ref 150–440)
RBC: 3.17 MIL/uL — ABNORMAL LOW (ref 3.80–5.20)
RDW: 15.1 % — ABNORMAL HIGH (ref 11.5–14.5)
WBC: 8.5 10*3/uL (ref 3.6–11.0)

## 2014-12-26 LAB — MAGNESIUM: Magnesium: 1.7 mg/dL (ref 1.7–2.4)

## 2014-12-26 LAB — GLUCOSE, CAPILLARY
GLUCOSE-CAPILLARY: 151 mg/dL — AB (ref 65–99)
Glucose-Capillary: 174 mg/dL — ABNORMAL HIGH (ref 65–99)

## 2014-12-26 MED ORDER — POLYETHYLENE GLYCOL 3350 17 G PO PACK
17.0000 g | PACK | Freq: Every day | ORAL | Status: DC | PRN
  Administered 2014-12-28: 10:00:00 17 g via ORAL
  Filled 2014-12-26 (×2): qty 1

## 2014-12-26 MED ORDER — ONDANSETRON HCL 4 MG/2ML IJ SOLN
4.0000 mg | Freq: Four times a day (QID) | INTRAMUSCULAR | Status: DC | PRN
  Administered 2014-12-26: 18:00:00 4 mg via INTRAVENOUS
  Filled 2014-12-26: qty 2

## 2014-12-26 MED ORDER — PANCRELIPASE (LIP-PROT-AMYL) 12000-38000 UNITS PO CPEP
36000.0000 [IU] | ORAL_CAPSULE | Freq: Three times a day (TID) | ORAL | Status: DC
  Administered 2014-12-26 – 2014-12-29 (×9): 36000 [IU] via ORAL
  Filled 2014-12-26 (×10): qty 3

## 2014-12-26 MED ORDER — SODIUM CHLORIDE 0.9 % IV SOLN
INTRAVENOUS | Status: DC
  Administered 2014-12-26 – 2014-12-29 (×6): via INTRAVENOUS

## 2014-12-26 MED ORDER — LORAZEPAM 2 MG/ML IJ SOLN: 1.0000 mg | INTRAMUSCULAR | Status: DC | PRN

## 2014-12-26 MED ORDER — HYDROCODONE-ACETAMINOPHEN 5-325 MG PO TABS
1.0000 | ORAL_TABLET | ORAL | Status: DC | PRN
  Administered 2014-12-29: 09:00:00 1 via ORAL
  Filled 2014-12-26: qty 1

## 2014-12-26 MED ORDER — INSULIN ASPART 100 UNIT/ML ~~LOC~~ SOLN
0.0000 [IU] | Freq: Three times a day (TID) | SUBCUTANEOUS | Status: DC
  Administered 2014-12-26: 17:00:00 2 [IU] via SUBCUTANEOUS
  Administered 2014-12-27 (×3): 100 [IU] via SUBCUTANEOUS
  Administered 2014-12-28: 17:00:00 2 [IU] via SUBCUTANEOUS
  Administered 2014-12-28 (×2): 100 [IU] via SUBCUTANEOUS
  Administered 2014-12-29: 09:00:00 2 [IU] via SUBCUTANEOUS
  Administered 2014-12-29: 11:00:00 3 [IU] via SUBCUTANEOUS
  Filled 2014-12-26: qty 2
  Filled 2014-12-26 (×2): qty 1
  Filled 2014-12-26 (×2): qty 2
  Filled 2014-12-26: qty 3
  Filled 2014-12-26: qty 1
  Filled 2014-12-26: qty 3
  Filled 2014-12-26: qty 1

## 2014-12-26 MED ORDER — PANTOPRAZOLE SODIUM 40 MG IV SOLR
40.0000 mg | Freq: Two times a day (BID) | INTRAVENOUS | Status: DC
  Administered 2014-12-26 – 2014-12-27 (×3): 40 mg via INTRAVENOUS
  Filled 2014-12-26 (×3): qty 40

## 2014-12-26 MED ORDER — ACETAMINOPHEN 325 MG PO TABS: 650.0000 mg | ORAL_TABLET | Freq: Four times a day (QID) | ORAL | Status: DC | PRN

## 2014-12-26 MED ORDER — IOHEXOL 350 MG/ML SOLN
100.0000 mL | Freq: Once | INTRAVENOUS | Status: AC | PRN
  Administered 2014-12-26: 100 mL via INTRAVENOUS

## 2014-12-26 MED ORDER — FENTANYL CITRATE (PF) 100 MCG/2ML IJ SOLN: 25.0000 ug | INTRAMUSCULAR | Status: DC | PRN

## 2014-12-26 MED ORDER — HYDROMORPHONE HCL 1 MG/ML IJ SOLN
1.0000 mg | INTRAMUSCULAR | Status: DC | PRN
  Administered 2014-12-26 – 2014-12-28 (×5): 1 mg via INTRAVENOUS
  Filled 2014-12-26 (×5): qty 1

## 2014-12-26 MED ORDER — HYDROMORPHONE HCL 1 MG/ML IJ SOLN
INTRAMUSCULAR | Status: AC
  Administered 2014-12-26: 1 mg
  Filled 2014-12-26: qty 1

## 2014-12-26 MED ORDER — DOCUSATE SODIUM 100 MG PO CAPS
100.0000 mg | ORAL_CAPSULE | Freq: Two times a day (BID) | ORAL | Status: DC
  Administered 2014-12-26 – 2014-12-29 (×7): 100 mg via ORAL
  Filled 2014-12-26 (×7): qty 1

## 2014-12-26 MED ORDER — ONDANSETRON HCL 4 MG/2ML IJ SOLN
INTRAMUSCULAR | Status: AC
  Administered 2014-12-26: 4 mg via INTRAVENOUS
  Filled 2014-12-26: qty 2

## 2014-12-26 MED ORDER — FAMOTIDINE 20 MG PO TABS
20.0000 mg | ORAL_TABLET | Freq: Two times a day (BID) | ORAL | Status: DC
  Administered 2014-12-26 – 2014-12-29 (×6): 20 mg via ORAL
  Filled 2014-12-26 (×6): qty 1

## 2014-12-26 MED ORDER — DIAZEPAM 5 MG/ML IJ SOLN
2.5000 mg | Freq: Once | INTRAMUSCULAR | Status: AC
  Administered 2014-12-26: 2.5 mg via INTRAVENOUS

## 2014-12-26 MED ORDER — ONDANSETRON HCL 4 MG PO TABS: 4.0000 mg | ORAL_TABLET | Freq: Four times a day (QID) | ORAL | Status: DC | PRN

## 2014-12-26 MED ORDER — POTASSIUM CHLORIDE CRYS ER 20 MEQ PO TBCR
20.0000 meq | EXTENDED_RELEASE_TABLET | Freq: Every day | ORAL | Status: DC
  Administered 2014-12-26 – 2014-12-29 (×4): 20 meq via ORAL
  Filled 2014-12-26 (×4): qty 1

## 2014-12-26 MED ORDER — DIAZEPAM 5 MG/ML IJ SOLN
INTRAMUSCULAR | Status: AC
  Administered 2014-12-26: 2.5 mg via INTRAVENOUS
  Filled 2014-12-26: qty 2

## 2014-12-26 MED ORDER — ALUM & MAG HYDROXIDE-SIMETH 200-200-20 MG/5ML PO SUSP: 30.0000 mL | ORAL | Status: DC | PRN

## 2014-12-26 MED ORDER — ACETAMINOPHEN 650 MG RE SUPP: 650.0000 mg | Freq: Four times a day (QID) | RECTAL | Status: DC | PRN

## 2014-12-26 MED ORDER — ONDANSETRON HCL 4 MG/2ML IJ SOLN: 4.0000 mg | Freq: Once | INTRAMUSCULAR | Status: DC | PRN

## 2014-12-26 NOTE — ED Notes (Signed)
Dr sparks at bedside 

## 2014-12-26 NOTE — ED Notes (Signed)
Report given to [place] rn for 104

## 2014-12-26 NOTE — Consult Note (Signed)
ORTHOPAEDIC CONSULTATION  PATIENT NAME: [name] DOB: 08-11-1928  MRN: [id number]  REQUESTING PHYSICIAN: Tracie Harrier, MD  Chief Complaint: Left thigh pain  HPI: [name] is a 81 y.o. female who complains of  a 2-3 day history of progressive left thigh pain. She denies any trauma or aggravating event. She has not appreciated any bruising to the site. She has noticed some progressive swelling to the anterior aspect of the left thigh. She presented to [facility] emergency department earlier today due to the severity of the pain. She received intravenous narcotics for pain control and subsequently admitted for pain management and further workup. She states that the left thigh pain is currently under control. She did have an episode of emesis during the evaluation.  Of note, the patient was recently diagnosed with a pancreatic mass and underwent biopsy with inconclusive findings. As part of the workup, an MRI demonstrated thrombosis of the splenic, portal, and superior mesenteric veins. She has been on Lovenox 80 mg twice a day. She does report a 10 pound weight loss recently without any change in her appetite. She denies any bowel or bladder dysfunction. She denies any numbness or weakness.  Past Medical History  Diagnosis Date  . Pancreatitis   . Hepatitis C   . Femur fracture   . Diabetes mellitus, type 2 11/01/2014  . Benign hypertension 11/01/2014   Past Surgical History  Procedure Laterality Date  . Total hip arthroplasty Left 1981  . Abdominal hysterectomy    . Appendectomy    . Cholecystectomy    . Intracapsular cataract extraction Left 2005  . Eus N/A 11/18/2014    Procedure: ESOPHAGEAL ENDOSCOPIC ULTRASOUND (EUS) RADIAL;  Surgeon: Cora Daniels, MD;  Location: [facility] ENDOSCOPY;  Service: Endoscopy;  Laterality: N/A;  . Orif of left femur fracture  04/2007   History   Social History  . Marital Status: Divorced    Spouse Name: N/A   . Number of Children: N/A  . Years of Education: N/A   Social History Main Topics  . Smoking status: Never Smoker   . Smokeless tobacco: Never Used  . Alcohol Use: 0.0 oz/week    0 Standard drinks or equivalent per week     Comment: Infrequent use; special occasion;  . Drug Use: No  . Sexual Activity: No   Other Topics Concern  . None   Social History Narrative   Family History  Problem Relation Age of Onset  . Hypertension Mother    Allergies  Allergen Reactions  . Aspirin Other (See Comments)    Stomach problems  . Omeprazole Nausea Only   Prior to Admission medications   Medication Sig Start Date End Date Taking? Authorizing Provider  acetaminophen (TYLENOL) 500 MG tablet Take 1,000 mg by mouth every 6 (six) hours as needed for moderate pain.   Yes Historical Provider, MD  CREON 36000 UNITS CPEP capsule Take 36,000 Units by mouth 3 (three) times daily before meals. And 1 before snacks. 12/23/14  Yes Historical Provider, MD  docusate sodium (COLACE) 100 MG capsule Take 1 capsule (100 mg total) by mouth 2 (two) times daily. Patient taking differently: Take 100 mg by mouth 2 (two) times daily as needed for moderate constipation.  10/25/14  Yes Dustin Flock, MD  enoxaparin (LOVENOX) 80 MG/0.8ML injection Inject 0.7 mLs (70 mg total) into the skin every 12 (twelve) hours. As per Dr. Marylene Land schedule 11/05/14  Yes Evlyn Kanner, NP  lipase/protease/amylase (CREON) 12000 UNITS CPEP  capsule Take 36,000 Units by mouth 3 (three) times daily with meals.   Yes Historical Provider, MD  metFORMIN (GLUCOPHAGE) 500 MG tablet Take 1 tablet by mouth daily. 10/18/14  Yes Historical Provider, MD  ondansetron (ZOFRAN-ODT) 8 MG disintegrating tablet Take 8 mg by mouth every 8 (eight) hours as needed for nausea.  08/08/14  Yes Historical Provider, MD  oxyCODONE-acetaminophen (PERCOCET) 7.5-325 MG per tablet Take 1 tablet by mouth every 4 (four) hours as needed for severe pain. 10/25/14  Yes Dustin Flock, MD  polyethylene glycol (MIRALAX / GLYCOLAX) packet Take 17 g by mouth daily. Patient taking differently: Take 17 g by mouth daily as needed for mild constipation, moderate constipation or severe constipation.  10/25/14  Yes Dustin Flock, MD  potassium chloride SA (K-DUR,KLOR-CON) 20 MEQ tablet Take 20 mEq by mouth daily.   Yes Historical Provider, MD  ranitidine (ZANTAC) 150 MG tablet Take 150 mg by mouth 2 (two) times daily.   Yes Historical Provider, MD  alum & mag hydroxide-simeth (MAALOX/MYLANTA) 200-200-20 MG/5ML suspension Take 30 mLs by mouth every 4 (four) hours as needed for indigestion or heartburn (dyspepsia). 10/25/14   Dustin Flock, MD  omeprazole (PRILOSEC) 20 MG capsule Take 1 capsule (20 mg total) by mouth daily. 11/18/14   Cora Daniels, MD   Positive ROS: All other systems have been reviewed and were otherwise negative with the exception of those mentioned in the HPI and as above.  Physical Exam: General: Alert and alert in no acute distress. HEENT: Atraumatic and normocephalic. Sclera are clear. Extraocular motion is intact. Oropharynx is clear with moist mucosa. Neck: Supple, nontender, good range of motion.  Lungs: Clear to auscultation bilaterally. Cardiovascular: Regular rate and rhythm with normal S1 and S2. No murmurs. No gallops or rubs. Pedal pulses are palpable bilaterally. Homans test is negative bilaterally. No significant pretibial or ankle edema. Abdomen: Soft, nontender, and nondistended. No palpable masses. No rebound tenderness. Bowel sounds are present. Skin: No lesions or gross ecchymosis Neurologic: Awake, alert, and oriented. Sensory function is grossly intact. Motor strength is felt to be 5 over 5 bilaterally. No clonus or tremor. Good motor coordination. Lymphatic: No axillary or cervical lymphadenopathy  MUSCULOSKELETAL: Good range of motion, stability, and strength to the upper extremities into the right lower extremity. Semination of the  left lower extremity demonstrates localized swelling to the anterior thigh extending from the inguinal crease to the mid thigh. No ecchymosis or erythema is appreciated. Mild tenderness is noted to palpation on the site. Well-healed surgical incisions are noted laterally consistent with patient's previous surgical history. No gross tenderness to palpation about the knee or ankle. Homans test is negative. No significant pain is elicited with passive or active ankle dorsiflexion or dorsiflexion. Gentle range of motion of the left knee and left hip are well-tolerated.  X-rays: I reviewed AP and lateral radiographs of the left femur that were obtained earlier today. Cemented total hip implants are in place without gross evidence of loosening. A long distal condylar locking plate is noted along the lateral aspect of the femur. The previous femoral fracture is well consolidated. No evidence of hardware loosening. Diffuse osteopenia is present.  I also reviewed the CT scan of the left femur that was obtained earlier today. The left total hip implants and femoral hardware is intact without evidence of loosening. Although there is some artifact from the metal implants, there appears to be enlargement of the anterior left thigh musculature suggestive of a large complex  hematoma.  Assessment: Probable spontaneous hematoma formation to the left thigh  Plan: The findings were discussed in detail with the patient. I have discussed the findings with Dr. Doy Hutching. There is no indication of compartment syndrome at this time. Pain is well controlled. I have ordered a Polar Care to be applied to the left thigh to assist with controlling the swelling. Lovenox has been held as per Dr. Doy Hutching. Consults from vascular surgery and oncology are pending.  James P. Holley Bouche M.D.

## 2014-12-26 NOTE — ED Notes (Signed)
This RN at bedside with pt in CT, pt unable to lie flat due to the pain she is having in her left buttock and left thigh, dr Mamie Nick aware and meds ordered, this RN will stay with patient while finishing her ct exam

## 2014-12-26 NOTE — ED Provider Notes (Signed)
[facility] Emergency Department Provider Note  Time seen: 10:27 AM  I have reviewed the triage vital signs and the nursing notes.   HISTORY  Chief Complaint Leg Pain    HPI [name] is a 81 y.o. female with a past medical history of pancreatitis, hepatitis, diabetes, hypertension, with a history of a pancreatic mass currently being evaluated to evaluate for pancreatic cancer, who presents the emergency department with significant left thigh pain. According to the patient for the past 3 days she has had significant left eye pain that is not allowed her to walk. She was seen for the same 2 days ago, with a normal x-ray and normal ultrasound ruling out DVT. Patient states the pain went away somewhat, but then returned and worsened considerably. The patient can now not stand, or walk. Describes her pain as 10/10, aching in the left thigh. Patient also states a "knot" to the area. States the pain radiates around to her left lower back.     Past Medical History  Diagnosis Date  . Pancreatitis   . Hepatitis C   . Femur fracture   . H/O: hysterectomy   . History of cholecystectomy   . Hx of appendectomy   . Diabetes mellitus, type 2 11/01/2014  . Benign hypertension 11/01/2014    Patient Active Problem List   Diagnosis Date Noted  . Acute inflammation of the pancreas 11/01/2014  . Cataract 11/01/2014  . Chronic constipation 11/01/2014  . Diabetes mellitus, type 2 11/01/2014  . External hemorrhoids without complication 16/03/9603  . Arthralgia of hip 11/01/2014  . Personal history of infectious and parasitic disease 11/01/2014  . Benign hypertension 11/01/2014  . Decreased potassium in the blood 11/01/2014  . Pancreas (digestive gland) works poorly 11/01/2014  . Deep vein thrombosis of splenic vein 11/01/2014  . Acute pancreatitis   . Portal vein thrombosis   . Nausea with vomiting   . Abdominal pain, epigastric   . Pancreatic mass 10/19/2014     Past Surgical History  Procedure Laterality Date  . Total hip arthroplasty Left   . Abdominal hysterectomy    . Appendectomy    . Cholecystectomy    . Intracapsular cataract extraction Left 2005  . Eus N/A 11/18/2014    Procedure: ESOPHAGEAL ENDOSCOPIC ULTRASOUND (EUS) RADIAL;  Surgeon: Cora Daniels, MD;  Location: [facility] ENDOSCOPY;  Service: Endoscopy;  Laterality: N/A;    Current Outpatient Rx  Name  Route  Sig  Dispense  Refill  . acetaminophen (TYLENOL) 500 MG tablet   Oral   Take 1,000 mg by mouth every 6 (six) hours as needed for moderate pain.         Marland Kitchen alum & mag hydroxide-simeth (MAALOX/MYLANTA) 200-200-20 MG/5ML suspension   Oral   Take 30 mLs by mouth every 4 (four) hours as needed for indigestion or heartburn (dyspepsia).   355 mL   0   . amLODipine (NORVASC) 5 MG tablet   Oral   Take 1 tablet by mouth daily.          Marland Kitchen CREON 6000 UNITS CPEP   Oral   Take 1-3 capsules by mouth 3 (three) times daily before meals. Patient may take 1 capsule before snack in between meals           Dispense as written.   . docusate sodium (COLACE) 100 MG capsule   Oral   Take 1 capsule (100 mg total) by mouth 2 (two) times daily. Patient taking differently: Take  100 mg by mouth 2 (two) times daily as needed for moderate constipation.    10 capsule   0   . enoxaparin (LOVENOX) 150 MG/ML injection               . enoxaparin (LOVENOX) 80 MG/0.8ML injection   Subcutaneous   Inject 0.7 mLs (70 mg total) into the skin every 12 (twelve) hours. As per Dr. Marylene Land schedule   60 Syringe   1   . lipase/protease/amylase (CREON) 12000 UNITS CPEP capsule   Oral   Take 36,000 Units by mouth 3 (three) times daily with meals.         . metFORMIN (GLUCOPHAGE) 500 MG tablet   Oral   Take 1 tablet by mouth daily.         Marland Kitchen omeprazole (PRILOSEC) 20 MG capsule   Oral   Take 1 capsule (20 mg total) by mouth daily.   30 capsule   2   . ondansetron (ZOFRAN-ODT) 8  MG disintegrating tablet   Oral   Take 8 mg by mouth every 8 (eight) hours as needed for nausea.          Marland Kitchen oxyCODONE-acetaminophen (PERCOCET) 7.5-325 MG per tablet   Oral   Take 1 tablet by mouth every 4 (four) hours as needed for severe pain.   30 tablet   0   . polyethylene glycol (MIRALAX / GLYCOLAX) packet   Oral   Take 17 g by mouth daily.   14 each   0   . potassium chloride SA (K-DUR,KLOR-CON) 20 MEQ tablet   Oral   Take 20 mEq by mouth daily.         . ranitidine (ZANTAC) 150 MG tablet   Oral   Take 150 mg by mouth 2 (two) times daily.           Allergies Aspirin and Omeprazole  Family History  Problem Relation Age of Onset  . Hypertension Mother     Social History History  Substance Use Topics  . Smoking status: Never Smoker   . Smokeless tobacco: Never Used  . Alcohol Use: 0.0 oz/week    0 Standard drinks or equivalent per week     Comment: Infrequent use; special occasion;    Review of Systems Constitutional: Negative for fever. Cardiovascular: Negative for chest pain. Respiratory: Negative for shortness of breath. Gastrointestinal: Negative for abdominal pain Musculoskeletal: Positive for left lower back pain. Positive for left thigh pain. Neurological: Negative for headaches, focal weakness or numbness.  10-point ROS otherwise negative.  ____________________________________________   PHYSICAL EXAM:  VITAL SIGNS: ED Triage Vitals  Enc Vitals Group     BP 12/26/14 0932 110/84 mmHg     Pulse Rate 12/26/14 0932 97     Resp 12/26/14 0932 18     Temp 12/26/14 0932 97.9 F (36.6 C)     Temp Source 12/26/14 0932 Oral     SpO2 12/26/14 0932 98 %     Weight 12/26/14 0932 144 lb (65.318 kg)     Height 12/26/14 0932 5\' 2"  (1.575 m)     Head Cir --      Peak Flow --      Pain Score 12/26/14 0933 10     Pain Loc --      Pain Edu? --      Excl. in [place]? --     Constitutional: Alert and oriented. Well appearing and in no  distress. ENT   Head: Normocephalic and  atraumatic.   Nose: No congestion/rhinnorhea.   Mouth/Throat: Mucous membranes are moist. Cardiovascular: Normal rate, regular rhythm.  Respiratory: Normal respiratory effort without tachypnea nor retractions. Breath sounds are clear  Gastrointestinal: Soft and nontender. No distention.   Musculoskeletal: Moderate tenderness palpation of the left thigh, with what appears to be significant muscle spasm/mass the left thigh. No tenderness palpation of the calf, or popliteal fossa. Warm extremity, normal sensation. Neurologic:  Normal speech and language. No gross focal neurologic deficits are appreciated. Speech is normal. Skin:  Skin is warm, dry and intact.  Psychiatric: Mood and affect are normal. Speech and behavior are normal  ____________________________________________   RADIOLOGY  CT consistent with large  ____________________________________________    INITIAL IMPRESSION / ASSESSMENT AND PLAN / ED COURSE  Pertinent labs & imaging results that were available during my care of the patient were reviewed by me and considered in my medical decision making (see chart for details).  Patient with a recent complicated medical history of a pancreatic mass currently being evaluated. Patient had a PET scan on 12/10/14 which showed no clear cancerous process or metastatic lesions. Patient has been referred to an oncologist with an appointment next week. Patient called her primary care doctor regarding the left thigh pain, who told her she needs to go to the emergency department for CT scans and MRIs because it could be cancer in her leg (the PCP did not evaluate the patient before making these statements).  Patient is very concerned given her recent possible cancer diagnosis in her pancreas. Patient does have significant swelling/mass to the left thigh most consistent with severe muscle spasm. However as the patient is currently on Lovenox for  recently diagnosed portal venous thrombosis, we cannot rule out evolving hematoma. Patient does not appear to have ecchymosis as would likely be present after 3 days of symptoms, but I feel we should further evaluate with a CT scan of the left lower extremity. Patient recently (within the past 2 days) has had a left lower external the ultrasound showing no DVT, and x-ray showing no fracture. We will check labs, CT her left lower extremity, and treat her pain. Currently due to significant pain the patient is unable to ambulate, again likely due to severe muscle spasm.   CT consistent with large intramuscular hematoma. I discussed with Dr. Bess Harvest. They state the patient needs to be off blood thinners if possible, keep the leg elevated, with light compression, and limit ambulation. This is difficult being the patient is on anticoagulation for splenic and portal vein thrombosis. Given the degree of pain she is in and has been him for the last few days due to muscle spasm from this large hematoma I believe the patient requires inpatient admission for further workup, treatment, and to explore her anticoagulation options. I discussed with the hospice will be admitting the patient.  ____________________________________________   FINAL CLINICAL IMPRESSION(S) / ED DIAGNOSES  Muscle spasm Left quadricep intramuscular hematoma   Harvest Dark, MD 12/26/14 1331

## 2014-12-26 NOTE — Plan of Care (Signed)
Problem: Discharge Progression Outcomes Goal: Other Discharge Outcomes/Goals Outcome: Progressing Plan of care progress: Anemia: -CT positive for left thigh hematoma -monitor HGB, HGB 9.8 -IV fluids continue -PRN pain meds given with relief -o2 at 2L [place] not on chronic o2, o2 in place related to pain med administration

## 2014-12-26 NOTE — H&P (Signed)
History and Physical    [name] MLY:[id number] DOB: 1929/06/18 DOA: 12/26/2014  Referring physician: Dr. Kerman Passey PCP: Tracie Harrier, MD  Specialists:   Chief Complaint: left leg pain  HPI: [name] is a 81 y.o. female has a past medical history significant for HTN, DM, pancreatitis with presumed pancreatic CA with recent portal vein thrombosis on Lovenox now with 2 week hx of worsening pain and swelling of left thigh. Unable to walk due to pain. Brought to ER where pt was found to be more anemic with large bleed into the left thigh. Given multiple doses of IV pain meds in ER with persistent pain and inability to ambulate. She is now admitted for further evaluation.  Review of Systems: The patient denies anorexia, fever, weight loss,, vision loss, decreased hearing, hoarseness, chest pain, syncope, dyspnea on exertion balance deficits, hemoptysis, abdominal pain, melena, hematochezia, severe indigestion/heartburn, hematuria, incontinence, genital sores, suspicious skin lesions, transient blindness, depression, unusual weight change, abnormal bleeding, enlarged lymph nodes, angioedema, and breast masses.   Past Medical History  Diagnosis Date  . Pancreatitis   . Hepatitis C   . Femur fracture   . H/O: hysterectomy   . History of cholecystectomy   . Hx of appendectomy   . Diabetes mellitus, type 2 11/01/2014  . Benign hypertension 11/01/2014   Past Surgical History  Procedure Laterality Date  . Total hip arthroplasty Left   . Abdominal hysterectomy    . Appendectomy    . Cholecystectomy    . Intracapsular cataract extraction Left 2005  . Eus N/A 11/18/2014    Procedure: ESOPHAGEAL ENDOSCOPIC ULTRASOUND (EUS) RADIAL;  Surgeon: Cora Daniels, MD;  Location: [facility] ENDOSCOPY;  Service: Endoscopy;  Laterality: N/A;   Social History:  reports that she has never smoked. She has never used smokeless tobacco. She reports that she drinks alcohol. She reports that  she does not use illicit drugs.  Allergies  Allergen Reactions  . Aspirin Other (See Comments)    Stomach problems  . Omeprazole Nausea Only    Family History  Problem Relation Age of Onset  . Hypertension Mother     Prior to Admission medications   Medication Sig Start Date End Date Taking? Authorizing Provider  docusate sodium (COLACE) 100 MG capsule Take 1 capsule (100 mg total) by mouth 2 (two) times daily. Patient taking differently: Take 100 mg by mouth 2 (two) times daily as needed for moderate constipation.  10/25/14  Yes Dustin Flock, MD  enoxaparin (LOVENOX) 80 MG/0.8ML injection Inject 0.7 mLs (70 mg total) into the skin every 12 (twelve) hours. As per Dr. Marylene Land schedule 11/05/14  Yes Evlyn Kanner, NP  oxyCODONE-acetaminophen (PERCOCET) 7.5-325 MG per tablet Take 1 tablet by mouth every 4 (four) hours as needed for severe pain. 10/25/14  Yes Dustin Flock, MD  acetaminophen (TYLENOL) 500 MG tablet Take 1,000 mg by mouth every 6 (six) hours as needed for moderate pain.    Historical Provider, MD  alum & mag hydroxide-simeth (MAALOX/MYLANTA) 200-200-20 MG/5ML suspension Take 30 mLs by mouth every 4 (four) hours as needed for indigestion or heartburn (dyspepsia). 10/25/14   Dustin Flock, MD  amLODipine (NORVASC) 5 MG tablet Take 1 tablet by mouth daily.  10/08/14   Historical Provider, MD  CREON 6000 UNITS CPEP Take 1-3 capsules by mouth 3 (three) times daily before meals. Patient may take 1 capsule before snack in between meals 10/13/14   Historical Provider, MD  enoxaparin (LOVENOX) 150 MG/ML injection  10/25/14  Historical Provider, MD  lipase/protease/amylase (CREON) 12000 UNITS CPEP capsule Take 36,000 Units by mouth 3 (three) times daily with meals.    Historical Provider, MD  metFORMIN (GLUCOPHAGE) 500 MG tablet Take 1 tablet by mouth daily. 10/18/14   Historical Provider, MD  omeprazole (PRILOSEC) 20 MG capsule Take 1 capsule (20 mg total) by mouth daily. 11/18/14   Cora Daniels, MD  ondansetron (ZOFRAN-ODT) 8 MG disintegrating tablet Take 8 mg by mouth every 8 (eight) hours as needed for nausea.  08/08/14   Historical Provider, MD  polyethylene glycol (MIRALAX / GLYCOLAX) packet Take 17 g by mouth daily. 10/25/14   Dustin Flock, MD  potassium chloride SA (K-DUR,KLOR-CON) 20 MEQ tablet Take 20 mEq by mouth daily.    Historical Provider, MD  ranitidine (ZANTAC) 150 MG tablet Take 150 mg by mouth 2 (two) times daily.    Historical Provider, MD   Physical Exam: Filed Vitals:   12/26/14 1245 12/26/14 1300 12/26/14 1315 12/26/14 1330  BP:  128/70  138/111  Pulse: 88 86 92 87  Temp:      TempSrc:      Resp:      Height:      Weight:      SpO2: 97% 97% 97% 99%     General:  No apparent distress  Eyes: PERRL, EOMI, no scleral icterus  ENT: moist oropharynx  Neck: supple, no lymphadenopathy  Cardiovascular: regular rate without MRG; 2+ peripheral pulses, no JVD, no peripheral edema  Respiratory: CTA biL, good air movement without wheezing, rhonchi or crackled  Abdomen: soft, non tender to palpation, positive bowel sounds, no guarding, no rebound  Skin: no rashes  Musculoskeletal: normal bulk and tone, no joint swelling. Left thigh edematous and tender with mild warmth  Psychiatric: normal mood and affect  Neurologic: CN 2-12 grossly intact, MS 5/5 in all 4  Labs on Admission:  Basic Metabolic Panel:  Recent Labs Lab 12/24/14 0015 12/26/14 1045  NA 139 135  K 3.7 3.9  CL 106 102  CO2 23 24  GLUCOSE 190* 200*  BUN 18 17  CREATININE 0.45 0.47  CALCIUM 9.1 8.8*  MG  --  1.7   Liver Function Tests:  Recent Labs Lab 12/26/14 1045  AST 45*  ALT 30  ALKPHOS 47  BILITOT 0.8  PROT 6.6  ALBUMIN 3.3*   No results for input(s): LIPASE, AMYLASE in the last 168 hours. No results for input(s): AMMONIA in the last 168 hours. CBC:  Recent Labs Lab 12/24/14 0015 12/26/14 1045  WBC 5.9 8.5  NEUTROABS  --  6.9*  HGB 11.3* 9.8*   HCT 34.4* 30.1*  MCV 94.9 95.0  PLT 179 239   Cardiac Enzymes:  Recent Labs Lab 12/24/14 0015  CKTOTAL 82    BNP (last 3 results) No results for input(s): BNP in the last 8760 hours.  ProBNP (last 3 results) No results for input(s): PROBNP in the last 8760 hours.  CBG: No results for input(s): GLUCAP in the last 168 hours.  Radiological Exams on Admission: Ct Femur Left W Contrast  12/26/2014   CLINICAL DATA:  Left leg pain since yesterday.  EXAM: CT OF THE LOWER LEFT EXTREMITY WITHOUT CONTRAST  TECHNIQUE: Multidetector CT imaging of the left lower extremity was performed according to the standard protocol.  COMPARISON:  None.  FINDINGS: There are remote surgical changes involving the left hip and thigh with a total left hip arthroplasty and a long sideplate and multiple screws  involving the distal femur. Probable extruded bone-cement involving both the medial and lateral aspects of the upper femoral shaft. The prosthesis is intact. The cemented acetabular cup is intact.  There is enlargement of the left thigh musculature with what looks like a large complex hematoma. Some of these areas appear liquified. This is in the anterior thigh compartment and begins at the level of the hip and extends all the way down to the mid thigh.  IMPRESSION: Intact left hip and femur hardware without complicating features.  Suspect large intermuscular hematoma involving the anterior thigh.   Electronically Signed   By: Marijo Sanes M.D.   On: 12/26/2014 12:33    EKG: Independently reviewed.  Assessment/Plan Active Problems:   * No active hospital problems. *   Will admit to floor with IV pain meds. Consult Orthopedics, Vascular surgery, and Oncology. Follow hgb closely. Follow sugars. Repeat labs in AM. PT, PC, and CSW consults as well.  Diet: clear liquid Fluids: NS@75  DVT Prophylaxis: TED hose  Code Status: FULL  Family Communication: none  Disposition Plan: SNF  Time spent: 50 min

## 2014-12-26 NOTE — ED Notes (Signed)
Brought in via ems from home with left leg pain  States pain starts at hip and radiates into knee..denies recent injury and was seen for same last week

## 2014-12-27 ENCOUNTER — Telehealth: Payer: Self-pay | Admitting: *Deleted

## 2014-12-27 LAB — CBC
HCT: 25.8 % — ABNORMAL LOW (ref 35.0–47.0)
Hemoglobin: 8.4 g/dL — ABNORMAL LOW (ref 12.0–16.0)
MCH: 31 pg (ref 26.0–34.0)
MCHC: 32.6 g/dL (ref 32.0–36.0)
MCV: 95 fL (ref 80.0–100.0)
Platelets: 186 10*3/uL (ref 150–440)
RBC: 2.71 MIL/uL — AB (ref 3.80–5.20)
RDW: 14.8 % — AB (ref 11.5–14.5)
WBC: 5.6 10*3/uL (ref 3.6–11.0)

## 2014-12-27 LAB — COMPREHENSIVE METABOLIC PANEL
ALBUMIN: 2.9 g/dL — AB (ref 3.5–5.0)
ALT: 25 U/L (ref 14–54)
ANION GAP: 5 (ref 5–15)
AST: 39 U/L (ref 15–41)
Alkaline Phosphatase: 44 U/L (ref 38–126)
BILIRUBIN TOTAL: 0.7 mg/dL (ref 0.3–1.2)
BUN: 14 mg/dL (ref 6–20)
CO2: 27 mmol/L (ref 22–32)
Calcium: 8.2 mg/dL — ABNORMAL LOW (ref 8.9–10.3)
Chloride: 108 mmol/L (ref 101–111)
Creatinine, Ser: 0.37 mg/dL — ABNORMAL LOW (ref 0.44–1.00)
GFR calc Af Amer: >60 mL/min (ref >60)
GFR calc non Af Amer: >60 mL/min (ref >60)
Glucose, Bld: 137 mg/dL — ABNORMAL HIGH (ref 65–99)
POTASSIUM: 3.9 mmol/L (ref 3.5–5.1)
Sodium: 140 mmol/L (ref 135–145)
TOTAL PROTEIN: 5.4 g/dL — AB (ref 6.5–8.1)

## 2014-12-27 LAB — GLUCOSE, CAPILLARY
GLUCOSE-CAPILLARY: 149 mg/dL — AB (ref 65–99)
GLUCOSE-CAPILLARY: 138 mg/dL — AB (ref 65–99)
Glucose-Capillary: 130 mg/dL — ABNORMAL HIGH (ref 65–99)
Glucose-Capillary: 140 mg/dL — ABNORMAL HIGH (ref 65–99)

## 2014-12-27 LAB — PROTIME-INR
INR: 1.08
PROTHROMBIN TIME: 14.2 s (ref 11.4–15.0)

## 2014-12-27 LAB — APTT: aPTT: 34 seconds (ref 24–36)

## 2014-12-27 MED ORDER — PANTOPRAZOLE SODIUM 40 MG PO TBEC
40.0000 mg | DELAYED_RELEASE_TABLET | Freq: Two times a day (BID) | ORAL | Status: DC
  Administered 2014-12-27 – 2014-12-29 (×4): 40 mg via ORAL
  Filled 2014-12-27 (×4): qty 1

## 2014-12-27 NOTE — Telephone Encounter (Signed)
[name] says her mom is in room 104. She had a hematoma within the quadrucep muscle. [name] is very concerned because she was told they are not giving her mother anything for her blood clotting. She said they would have to cancel Wednesday appt w/you in [facility] and she is also hoping you will see her mom while in the hospital. Right now she is in so much pain she can not stand up. [name] said they just found out for ten years her mom has been taking the wrong dose of her pancreas medicine. [name] was so upset she broke down crying on the phone. Please call.

## 2014-12-27 NOTE — Plan of Care (Signed)
Problem: Discharge Progression Outcomes Goal: Other Discharge Outcomes/Goals Pain med given x1 for left leg pain with improvement VSS Polar care applied to left thigh No oral intake this shift Difficulty with movement due to thigh pain

## 2014-12-27 NOTE — Consult Note (Signed)
[place]  [phone] [phone]  ID: [name] OB: 04-13-1929  MR#: [id number]  KDT#:[id number]  Patient Care Team: Tracie Harrier, MD as PCP - General (Internal Medicine) Clent Jacks, RN as Registered Nurse  CHIEF COMPLAINT:  Chief Complaint  Patient presents with  . Leg Pain    INTERVAL HISTORY: Patient is an 81 year old female who was recently found to have a pancreatic mass as well as a portal vein thrombosis. She was recently admitted the hospital with leg pain and found to have a large hematoma. Other than her pain, she feels well. She has no neurologic complaints. She denies any fevers. She denies any weight loss. She has no chest pain or shortness of breath. She denies any abdominal pain. She has no nausea, vomiting, constipation, or diarrhea. Patient otherwise feels well and offers no further specific complaints.  REVIEW OF SYSTEMS:   Review of Systems  Constitutional: Negative.   Cardiovascular: Negative.   Gastrointestinal: Negative.   Musculoskeletal:       Leg pain    As per HPI. Otherwise, a complete review of systems is negatve.  PAST MEDICAL HISTORY: Past Medical History  Diagnosis Date  . Pancreatitis   . Hepatitis C   . Femur fracture   . Diabetes mellitus, type 2 11/01/2014  . Benign hypertension 11/01/2014    PAST SURGICAL HISTORY: Past Surgical History  Procedure Laterality Date  . Total hip arthroplasty Left 1981  . Abdominal hysterectomy    . Appendectomy    . Cholecystectomy    . Intracapsular cataract extraction Left 2005  . Eus N/A 11/18/2014    Procedure: ESOPHAGEAL ENDOSCOPIC ULTRASOUND (EUS) RADIAL;  Surgeon: Cora Daniels, MD;  Location: [facility] ENDOSCOPY;  Service: Endoscopy;  Laterality: N/A;  . Orif of left femur fracture  04/2007    FAMILY HISTORY Family History  Problem Relation Age of Onset  . Hypertension Mother        ADVANCED DIRECTIVES:    HEALTH  MAINTENANCE: History  Substance Use Topics  . Smoking status: Never Smoker   . Smokeless tobacco: Never Used  . Alcohol Use: 0.0 oz/week    0 Standard drinks or equivalent per week     Comment: Infrequent use; special occasion;     Colonoscopy:  PAP:  Bone density:  Lipid panel:  Allergies  Allergen Reactions  . Aspirin Other (See Comments)    Stomach problems  . Omeprazole Nausea Only    Current Facility-Administered Medications  Medication Dose Route Frequency Provider Last Rate Last Dose  . 0.9 %  sodium chloride infusion   Intravenous Continuous Idelle Crouch, MD 75 mL/hr at 12/27/14 0355    . acetaminophen (TYLENOL) tablet 650 mg  650 mg Oral Q6H PRN Idelle Crouch, MD       Or  . acetaminophen (TYLENOL) suppository 650 mg  650 mg Rectal Q6H PRN Idelle Crouch, MD      . alum & mag hydroxide-simeth (MAALOX/MYLANTA) 200-200-20 MG/5ML suspension 30 mL  30 mL Oral Q4H PRN Idelle Crouch, MD      . docusate sodium (COLACE) capsule 100 mg  100 mg Oral BID Idelle Crouch, MD   100 mg at 12/27/14 1019  . famotidine (PEPCID) tablet 20 mg  20 mg Oral BID Idelle Crouch, MD   20 mg at 12/27/14 1019  . HYDROcodone-acetaminophen (NORCO/VICODIN) 5-325 MG per tablet 1-2 tablet  1-2 tablet Oral Q4H PRN Idelle Crouch, MD      .  HYDROmorphone (DILAUDID) injection 1 mg  1 mg Intravenous Q2H PRN Idelle Crouch, MD   1 mg at 12/27/14 0913  . insulin aspart (novoLOG) injection 0-9 Units  0-9 Units Subcutaneous TID WC Idelle Crouch, MD   100 Units at 12/27/14 1220  . lipase/protease/amylase (CREON) capsule 36,000 Units  36,000 Units Oral TID WC Idelle Crouch, MD   36,000 Units at 12/27/14 1220  . LORazepam (ATIVAN) injection 1 mg  1 mg Intravenous Q4H PRN Idelle Crouch, MD      . ondansetron [employer]) tablet 4 mg  4 mg Oral Q6H PRN Idelle Crouch, MD       Or  . ondansetron [employer]) injection 4 mg  4 mg Intravenous Q6H PRN Idelle Crouch, MD   4 mg at 12/26/14 1825    . pantoprazole (PROTONIX) EC tablet 40 mg  40 mg Oral BID Tracie Harrier, MD   40 mg at 12/27/14 1239  . polyethylene glycol (MIRALAX / GLYCOLAX) packet 17 g  17 g Oral Daily PRN Idelle Crouch, MD      . potassium chloride SA (K-DUR,KLOR-CON) CR tablet 20 mEq  20 mEq Oral Daily Idelle Crouch, MD   20 mEq at 12/27/14 1019    OBJECTIVE: Filed Vitals:   12/27/14 1319  BP: 135/72  Pulse: 104  Temp: 98.7 F (37.1 C)  Resp:      Body mass index is 28.29 kg/(m^2).    ECOG FS:1 - Symptomatic but completely ambulatory  General: Well-developed, well-nourished, no acute distress. Eyes: Pink conjunctiva, anicteric sclera. HEENT: Normocephalic, moist mucous membranes, clear oropharnyx. Lungs: Clear to auscultation bilaterally. Heart: Regular rate and rhythm. No rubs, murmurs, or gallops. Abdomen: Soft, nontender, nondistended. No organomegaly noted, normoactive bowel sounds. Musculoskeletal: No edema, cyanosis, or clubbing. Neuro: Alert, answering all questions appropriately. Cranial nerves grossly intact. Skin: No rashes or petechiae noted. Psych: Normal affect. Lymphatics: No cervical, calvicular, axillary or inguinal LAD.   LAB RESULTS:  Lab Results  Component Value Date   NA 140 12/27/2014   K 3.9 12/27/2014   CL 108 12/27/2014   CO2 27 12/27/2014   GLUCOSE 137* 12/27/2014   BUN 14 12/27/2014   CREATININE 0.37* 12/27/2014   CALCIUM 8.2* 12/27/2014   PROT 5.4* 12/27/2014   ALBUMIN 2.9* 12/27/2014   AST 39 12/27/2014   ALT 25 12/27/2014   ALKPHOS 44 12/27/2014   BILITOT 0.7 12/27/2014   GFRNONAA >60 12/27/2014   GFRAA >60 12/27/2014    Lab Results  Component Value Date   WBC 5.6 12/27/2014   NEUTROABS 6.9* 12/26/2014   HGB 8.4* 12/27/2014   HCT 25.8* 12/27/2014   MCV 95.0 12/27/2014   PLT 186 12/27/2014     STUDIES: Ct Femur Left W Contrast  12/26/2014   CLINICAL DATA:  Left leg pain since yesterday.  EXAM: CT OF THE LOWER LEFT EXTREMITY WITHOUT CONTRAST   TECHNIQUE: Multidetector CT imaging of the left lower extremity was performed according to the standard protocol.  COMPARISON:  None.  FINDINGS: There are remote surgical changes involving the left hip and thigh with a total left hip arthroplasty and a long sideplate and multiple screws involving the distal femur. Probable extruded bone-cement involving both the medial and lateral aspects of the upper femoral shaft. The prosthesis is intact. The cemented acetabular cup is intact.  There is enlargement of the left thigh musculature with what looks like a large complex hematoma. Some of these areas appear liquified. This  is in the anterior thigh compartment and begins at the level of the hip and extends all the way down to the mid thigh.  IMPRESSION: Intact left hip and femur hardware without complicating features.  Suspect large intermuscular hematoma involving the anterior thigh.   Electronically Signed   By: Marijo Sanes M.D.   On: 12/26/2014 12:33   Nm Pet Image Initial (pi) Skull Base To Thigh  12/10/2014   CLINICAL DATA:  Initial treatment strategy for pancreatic mass.  EXAM: NUCLEAR MEDICINE PET SKULL BASE TO THIGH  TECHNIQUE: 12.4 mCi F-18 FDG was injected intravenously. Full-ring PET imaging was performed from the skull base to thigh after the radiotracer. CT data was obtained and used for attenuation correction and anatomic localization.  FASTING BLOOD GLUCOSE:  Value: 196 mg/dl  COMPARISON:  MRI 10/21/2014, CT 10/19/2014  FINDINGS: NECK  No hypermetabolic lymph nodes in the neck. On the first image there is a calcific mass extending along the medial aspect of the left middle cranial fossa abutting the skull base measuring 3 cm by 1.7 cm (image number 1). There is a significant metabolic axis activity associated with this a calcific mass.  CHEST  No hypermetabolic mediastinal lymph nodes. Small subpleural nodule in the right lower lobe measures 4 mm on image 117 of series 3. Nodule along the right  horizontal fissure is elongated with a benign morphology measuring 4 mm on image 113 series 3.  ABDOMEN/PELVIS  There is no discrete hypermetabolic mass associated with the pancreas. The pancreatic body and ducts are difficult to define. There is 1 focus of metabolic activity above background in the region of pancreatic head measuring 1 cm on image 138 of data set. The activity of this focus is mild with SUV max 2.6.  There is again demonstrated extensive thrombus within the portal vein, splenic vein and superior mesenteric vein. This thrombus is not hypermetabolic suggesting bland thrombus. There is no abnormal metabolic activity in the liver. No hypermetabolic periportal lymph nodes.  There is evidence of portal hypertension with venous collaterals in the splenic hilum and superior to the spleen. There is a large hiatal hernia.  No hypermetabolic abdominal pelvic lymph nodes. Hysterectomy. Adrenal glands are normal.  SKELETON  No focal hypermetabolic activity to suggest skeletal metastasis.  IMPRESSION: 1. No clear focal metabolic activity within the pancreas to suggest pancreatic adenocarcinoma. Single focus of activity in the region of the pancreatic head is nonspecific. 2. Extensive venous thrombosis within the portal vein, superior mesenteric vein, and splenic vein with venous collateralization. Collateralization suggests a chronic process. 3. No evidence of distant metastasis. 4. Small right pulmonary nodules are likely benign. 5. Large calcific lesion in the medial left middle cranial fossa along the skullbase is likely a benign meningioma.   Electronically Signed   By: Suzy Bouchard M.D.   On: 12/10/2014 12:23   US Venous Img Lower Unilateral Left  12/23/2014   CLINICAL DATA:  Left leg pain for 1 day  EXAM: Left LOWER EXTREMITY VENOUS DOPPLER ULTRASOUND  TECHNIQUE: Gray-scale sonography with graded compression, as well as color Doppler and duplex ultrasound were performed to evaluate the lower  extremity deep venous systems from the level of the common femoral vein and including the common femoral, femoral, profunda femoral, popliteal and calf veins including the posterior tibial, peroneal and gastrocnemius veins when visible. The superficial great saphenous vein was also interrogated. Spectral Doppler was utilized to evaluate flow at rest and with distal augmentation maneuvers in the common femoral,  femoral and popliteal veins.  COMPARISON:  None.  FINDINGS: Contralateral Common Femoral Vein: Respiratory phasicity is normal and symmetric with the symptomatic side. No evidence of thrombus. Normal compressibility.  Common Femoral Vein: No evidence of thrombus.  Saphenofemoral Junction: No evidence of thrombus.  Profunda Femoral Vein: No evidence of thrombus.  Femoral Vein: No evidence of thrombus.  Popliteal Vein: No evidence of thrombus.  Calf Veins: No evidence of thrombus.  Superficial Great Saphenous Vein: No evidence of thrombus.  IMPRESSION: No evidence of left lower extremity deep venous thrombosis.   Electronically Signed   By: Monte Fantasia M.D.   On: 12/23/2014 23:49   Dg Femur Min 2 Views Left  12/24/2014   CLINICAL DATA:  Mid femur pain.  History of fracture.  EXAM: LEFT FEMUR 2 VIEWS  COMPARISON:  None currently available.  FINDINGS: Unusual left hip prosthesis with extensive acetabular component cement. The prosthesis is located, well seated, and appears stable when compared to PET-CT 12/10/2014. Extensive cerclage wire fixation of the greater trochanter with chronic fragmentation.  Bulky mature callus on both sides of the proximal femoral diaphysis is also stable from CT, when no discrete fracture line or abnormal metabolism was seen. Long plate and screw fixation of a distal femoral diaphysis fracture is intact. No dislocation. No acute osseous fracture  IMPRESSION: 1. No acute findings. 2. Extensive postsurgical change to the left hip and femur, as above.   Electronically Signed   By:  Monte Fantasia M.D.   On: 12/24/2014 00:42    ASSESSMENT: Pancreatic mass, portal vein thrombosis, hematoma.  PLAN:    1. Pancreatic mass: PET scan results reviewed independently and reported as above are not consistent with malignancy. Patient also had EUS biopsy performed at [facility] which was also negative for malignancy. No further intervention is needed at this time. 2. Portal vein thrombosis: Given patient's large hematoma in her leg, agree with vascular surgery that the risk of anticoagulation at this time, outways the benefit.  3. Hematoma: Conservative management only. 4. Disposition: Plan is to discharge patient to skilled nursing facility. Follow-up in the [place] per Dr. Mike Gip.  Appreciate consult, call with questions.   Lloyd Huger, MD   12/27/2014 1:51 PM

## 2014-12-27 NOTE — Care Management (Addendum)
Admitted to [facility] with the diagnosis of acute blood loss anemia. Lives with family. Daughter is [name] [phone]). Goes to [facility] for pancreatic mass treatment. Sees Dr. Ginette Pitman as primary care physician.  Physical therapy evaluation completed. Recommends skilled nursing facility. Clinical Social Worker updated. Shelbie Ammons RN MSN Care Management [phone]

## 2014-12-27 NOTE — Patient Outreach (Signed)
[name]) Care Management  12/27/2014  [name] Oct 04, 1928 [id number]   Notification from Katherine Dus, RN to close case as patient is involved in an external Oncology program.  [name]. [place], [place] Management [place] Assistant Phone: [phone] Fax: [phone]

## 2014-12-27 NOTE — Plan of Care (Signed)
Problem: Discharge Progression Outcomes Goal: Other Discharge Outcomes/Goals Outcome: Progressing Plan of Care Progressing to Goal: Pain medication given with noted relief. VSS. Polar care on left leg. Tolerating diet. Worked with physical therapy.

## 2014-12-27 NOTE — Progress Notes (Signed)
IV to PO:    81 yo female ordered pantoprazole 40mg  IV Q12hr. Patient is also ordered famotidine 20mg  PO Q12hr.    Allergies: Aspirin, Omeprazole (patient currently tolerating IV Pantoprazole and outpatient record lists omeprazole)    Patient is eating and tolerating other PO medications. Will continue patient on pantoprazole 40mg  PO BID.

## 2014-12-27 NOTE — Progress Notes (Signed)
PROGRESS NOTE  [name] ZHG:[id number] DOB: 27-Feb-1929 DOA: 12/26/2014 PCP: Tracie Harrier, MD  HPI/Recap of past 24 hours: 81 y/o f with hx of Pancreatic mass, HTN, Recent portal vein thrombosis,- On Lovnox admitted with 2 week hx of swelling and pain in left thigh. This am feels better   Consultants: Dr. Lucky Cowboy- Vascular   Objective: BP 135/72 mmHg  Pulse 104  Temp(Src) 98.7 F (37.1 C) (Oral)  Resp 20  Ht 5\' 2"  (1.575 m)  Wt 70.171 kg (154 lb 11.2 oz)  BMI 28.29 kg/m2  SpO2 95%  Intake/Output Summary (Last 24 hours) at 12/27/14 1324 Last data filed at 12/27/14 1207  Gross per 24 hour  Intake 1392.5 ml  Output     50 ml  Net 1342.5 ml   Filed Weights   12/26/14 0932 12/27/14 0500  Weight: 65.318 kg (144 lb) 70.171 kg (154 lb 11.2 oz)    Exam: Alert and oriented. HEENT: [place], AT  General:  Anxious  Cardiovascular: S1 S2  Respiratory: Clear to auscultation  Abdomen: Soft   Extremities; Left thigh warm with mild tenderness   Neuro:Non Focal  Data Reviewed: Basic Metabolic Panel:  Recent Labs Lab 12/24/14 0015 12/26/14 1045 12/27/14 0508  NA 139 135 140  K 3.7 3.9 3.9  CL 106 102 108  CO2 23 24 27   GLUCOSE 190* 200* 137*  BUN 18 17 14   CREATININE 0.45 0.47 0.37*  CALCIUM 9.1 8.8* 8.2*  MG  --  1.7  --    Liver Function Tests:  Recent Labs Lab 12/26/14 1045 12/27/14 0508  AST 45* 39  ALT 30 25  ALKPHOS 47 44  BILITOT 0.8 0.7  PROT 6.6 5.4*  ALBUMIN 3.3* 2.9*   No results for input(s): LIPASE, AMYLASE in the last 168 hours. No results for input(s): AMMONIA in the last 168 hours. CBC:  Recent Labs Lab 12/24/14 0015 12/26/14 1045 12/27/14 0508  WBC 5.9 8.5 5.6  NEUTROABS  --  6.9*  --   HGB 11.3* 9.8* 8.4*  HCT 34.4* 30.1* 25.8*  MCV 94.9 95.0 95.0  PLT 179 239 186   Cardiac Enzymes:    Recent Labs Lab 12/24/14 0015  CKTOTAL 82   BNP (last 3 results) No results for input(s): BNP in the last 8760  hours.  ProBNP (last 3 results) No results for input(s): PROBNP in the last 8760 hours.  CBG:  Recent Labs Lab 12/26/14 1621 12/26/14 2209 12/27/14 0738 12/27/14 1128  GLUCAP 174* 151* 130* 149*    No results found for this or any previous visit (from the past 240 hour(s)).   Studies: No results found.  Scheduled Meds: . docusate sodium  100 mg Oral BID  . famotidine  20 mg Oral BID  . insulin aspart  0-9 Units Subcutaneous TID WC  . lipase/protease/amylase  36,000 Units Oral TID WC  . pantoprazole  40 mg Oral BID  . potassium chloride SA  20 mEq Oral Daily    Continuous Infusions: . sodium chloride 75 mL/hr at 12/27/14 0355    Assessment/Plan: 1 Left Thigh hematoma: Most likely from Lovenox Symptomatically better with conservative mnagement. No operative intervention needed. 2 Hx of Portal vein Thrombosis: Hold off on anticoagulation 3 Hx of Pancreatitis/Possible mass; On Pancreatic enzymes 4 Type 2 DM: Continue insulin   Code Status: Full        [name]   12/27/2014, 1:24 PM  LOS: 1 day

## 2014-12-27 NOTE — Clinical Social Work Note (Signed)
Clinical Social Work Assessment  Patient Details  Name: [name] MRN: [id number] Date of Birth: December 17, 1928  Date of referral:  12/27/14               Reason for consult:  Facility Placement                Permission sought to share information with:  Family Supports Permission granted to share information::  Yes, Verbal Permission Granted  Name::     Donna/ [phone]  Agency::  SNFs  Relationship::  Daughter  Contact Information:     Housing/Transportation Living arrangements for the past 2 months:  Single Family Home Source of Information:  Patient, Adult Children Patient Interpreter Needed:  None Criminal Activity/Legal Involvement Pertinent to Current Situation/Hospitalization:  No - Comment as needed Significant Relationships:  Adult Children Lives with:  Adult Children Do you feel safe going back to the place where you live?  No Need for family participation in patient care:  Yes (Comment)  Care giving concerns:  Pt lives with her adult daughter who is very involved in Pt's daily care.  Pt's daughter did report that it has been increasingly difficult for her to take care of her mother at home, though she would prefer her mother to "not need to go there."    Facilities manager / plan:  CSW met with Pt to discuss dc plans. Pt shared that she is divorced and has 2 adult children. She states that she lives with her daughter who provides assistance with administering medications and taking care of Pt's daily needs. CSW discusses the recommendation of STR with Pt and she shares that she has been to [facility] but would like to go to a place closer to her home in [place].  CSW explained that we could find one a little bit closer to [place] that is still in network with her insurance.  Pt requested that CSW contact her daughter to discuss plans.  CSW contacted Pt's daughter who relayed the same info and is requesting Pt be close to her in [place] if possible.   CSW  sent out Pt's information to SNFs and relayed some of the offers back to the daughter. Pt's insurance requires preauth before discharge which can be completed within a few hours.   Employment status:  Retired Surveyor, [employer] Care PT Recommendations:  [place] / Referral to community resources:  [place]  Patient/Family's Response to care:  Pt states that she is agreeable to SNF but wants to be close to her daughter so that it is easier for daughter to get to the facility.  Patient/Family's Understanding of and Emotional Response to Diagnosis, Current Treatment, and Prognosis: Pt's daughter was tearful with this CSW stating that she can't lose her mother that "we are all each other has." Pt's daughter shared that she has a severe anxiety disorder triggered by driving so she would prefer it if her mother could be close to home. Pt asking questions about her medications like "why did I stop getting the shots in my belly."   Emotional Assessment Appearance:  Appears stated age Attitude/Demeanor/Rapport:   (anxious; pleasant) Affect (typically observed):  Accepting, Anxious Orientation:  Oriented to Self, Oriented to Place, Oriented to  Time, Oriented to Situation Alcohol / Substance use:    Psych involvement (Current and /or in the community):  No (Comment)  Discharge Needs  Concerns to be addressed:  Discharge Planning Concerns, Adjustment to Illness  Readmission within the last 30 days:  No Current discharge risk:  Dependent with Mobility Barriers to Discharge:  [place], LCSW 12/27/2014, 4:18 PM

## 2014-12-27 NOTE — Evaluation (Signed)
Physical Therapy Evaluation Patient Details Name: [name] MRN: [id number] DOB: 12-30-1928 Today's Date: 12/27/2014   History of Present Illness  [name] is a 81 y.o. female has a past medical history significant for HTN, DM, pancreatitis with presumed pancreatic CA with recent portal vein thrombosis on Lovenox now with 2 week hx of worsening pain and swelling of left thigh. Unable to walk due to pain. Brought to ER where pt was found to be more anemic with large bleed into the left thigh. Given multiple doses of IV pain meds in ER with persistent pain and inability to ambulate. She is now admitted for further evaluation. Pt reports that prior to onset of LLE pain she was a limited community ambulator without assistive device. She reports 1 fall in the last 12 months but not recently.   Clinical Impression  Pt reports severe pain with all mobility. She requires minA+1 to assist with bed mobility and transfers as well as ambulation due to LLE weakness and pain. Pt is highly motivated. Her goal is to return home as soon as possible but realizes that she will need SNF placement initially. Pt will benefit from skilled PT services to address deficits in strength, balance, and mobility in order to return to full function at home.     Follow Up Recommendations SNF    Equipment Recommendations  None recommended by PT    Recommendations for Other Services       Precautions / Restrictions Precautions Precautions: Fall Restrictions Weight Bearing Restrictions: No      Mobility  Bed Mobility Overal bed mobility: Needs Assistance Bed Mobility: Supine to Sit     Supine to sit: Min assist     General bed mobility comments: Assist to adduct LLE to EOB due to pain and weakness.  Transfers Overall transfer level: Needs assistance Equipment used: Rolling walker (2 wheeled) Transfers: Sit to/from Stand Sit to Stand: Min assist         General transfer comment: Pt requires  cues for safe hand placement and sequencing during transfer. Poor weight acceptance to LLE and initially pt with posterior sway requiring assistance to prevent LOB.  Ambulation/Gait Ambulation/Gait assistance: Min assist Ambulation Distance (Feet): 25 Feet Assistive device: Rolling walker (2 wheeled) Gait Pattern/deviations: Step-to pattern;Decreased stance time - left;Decreased weight shift to left;Decreased step length - right   Gait velocity interpretation: <1.8 ft/sec, indicative of risk for recurrent falls General Gait Details: Decreased R step length and L weight acceptance. Instruction for proper walker sequencing. Pain increases to 9/10 with ambulation.   Stairs            Wheelchair Mobility    Modified Rankin (Stroke Patients Only)       Balance Overall balance assessment: Needs assistance   Sitting balance-Leahy Scale: Fair       Standing balance-Leahy Scale: Poor                               Pertinent Vitals/Pain Pain Assessment: 0-10 Pain Score: 2  (Increases to 9/10 with ambulation) Pain Location: L thigh Pain Intervention(s): Premedicated before session;Monitored during session    Home Living Family/patient expects to be discharged to:: Private residence Living Arrangements: Children Available Help at Discharge: Family Type of Home: Apartment Home Access: Level entry     Home Layout: One level Home Equipment: Environmental consultant - 4 wheels;Crutches;Bedside commode;Cane - single point;Toilet riser;Other (comment) (tub shower with seat)  Prior Function Level of Independence: Independent         Comments: Drives, limited community ambulator without assistive device     Hand Dominance   Dominant Hand: Right    Extremity/Trunk Assessment   Upper Extremity Assessment: Overall WFL for tasks assessed (At least 4+/5 throughout)           Lower Extremity Assessment: LLE deficits/detail   LLE Deficits / Details: RLE is grossly 4+/5  througout. L ankle DF 4+/5. Unable to perform L SAQ, SLR, or seated marching due to pain.      Communication   Communication: No difficulties  Cognition Arousal/Alertness: Awake/alert Behavior During Therapy: WFL for tasks assessed/performed Overall Cognitive Status: Within Functional Limits for tasks assessed                      General Comments      Exercises        Assessment/Plan    PT Assessment Patient needs continued PT services  PT Diagnosis Difficulty walking;Abnormality of gait;Acute pain   PT Problem List Decreased strength;Decreased activity tolerance;Decreased mobility;Pain  PT Treatment Interventions DME instruction;Gait training;Functional mobility training;Therapeutic activities;Therapeutic exercise;Balance training;Neuromuscular re-education;Manual techniques   PT Goals (Current goals can be found in the Care Plan section) Acute Rehab PT Goals Patient Stated Goal: "I want to get back home" PT Goal Formulation: With patient Time For Goal Achievement: 01/10/15 Potential to Achieve Goals: Good    Frequency Min 2X/week   Barriers to discharge Decreased caregiver support Daughter unable to provide 24/7 assistance    Co-evaluation               End of Session Equipment Utilized During Treatment: Gait belt Activity Tolerance: Patient limited by pain Patient left: in chair;with chair alarm set;with call bell/phone within reach Nurse Communication: Mobility status    Functional Limitation: Mobility: Walking and moving around Mobility: Walking and Moving Around Current Status (D6644): At least 40 percent but less than 60 percent impaired, limited or restricted Mobility: Walking and Moving Around Goal Status [phone]): At least 1 percent but less than 20 percent impaired, limited or restricted    Time: 0950-1013 PT Time Calculation (min) (ACUTE ONLY): 23 min   Charges:   PT Evaluation $Initial PT Evaluation Tier I: 1 Procedure     PT G  Codes:   PT G-Codes **NOT FOR INPATIENT CLASS** Functional Limitation: Mobility: Walking and moving around Mobility: Walking and Moving Around Current Status (Q5956): At least 40 percent but less than 60 percent impaired, limited or restricted Mobility: Walking and Moving Around Goal Status [phone]): At least 1 percent but less than 20 percent impaired, limited or restricted   Phillips Grout PT, DPT   Isahia Hollerbach 12/27/2014, 10:28 AM

## 2014-12-27 NOTE — Consult Note (Signed)
[facility] Vascular Consult Note  MRN : [id number]  [name] is a 81 y.o. (May 18, 1929) female who presents with chief complaint of  Chief Complaint  Patient presents with  . Leg Pain  .  History of Present Illness: Patient with a recently diagnosed pancreatic cancer with portal vein thrombosis. She was started on anticoagulation which she was taking regularly. She presented to the emergency room with a 2 day history of left thigh pain and swelling. This became markedly enlarged and she was found to be anemic as well. She was found to have a large left thigh hematoma resulting from her anticoagulation. Her leg swelling has decreased a little since she has been in the hospital but is still significant. Ice and elevation has helped. The anticoagulation was the inciting event that caused the swelling and pain in her leg.  Current Facility-Administered Medications  Medication Dose Route Frequency Provider Last Rate Last Dose  . 0.9 %  sodium chloride infusion   Intravenous Continuous Idelle Crouch, MD 75 mL/hr at 12/27/14 0355    . acetaminophen (TYLENOL) tablet 650 mg  650 mg Oral Q6H PRN Idelle Crouch, MD       Or  . acetaminophen (TYLENOL) suppository 650 mg  650 mg Rectal Q6H PRN Idelle Crouch, MD      . alum & mag hydroxide-simeth (MAALOX/MYLANTA) 200-200-20 MG/5ML suspension 30 mL  30 mL Oral Q4H PRN Idelle Crouch, MD      . docusate sodium (COLACE) capsule 100 mg  100 mg Oral BID Idelle Crouch, MD   100 mg at 12/27/14 1019  . famotidine (PEPCID) tablet 20 mg  20 mg Oral BID Idelle Crouch, MD   20 mg at 12/27/14 1019  . HYDROcodone-acetaminophen (NORCO/VICODIN) 5-325 MG per tablet 1-2 tablet  1-2 tablet Oral Q4H PRN Idelle Crouch, MD      . HYDROmorphone (DILAUDID) injection 1 mg  1 mg Intravenous Q2H PRN Idelle Crouch, MD   1 mg at 12/27/14 0913  . insulin aspart (novoLOG) injection 0-9 Units  0-9 Units Subcutaneous TID WC Idelle Crouch, MD   100 Units at 12/27/14 0825  . lipase/protease/amylase (CREON) capsule 36,000 Units  36,000 Units Oral TID WC Idelle Crouch, MD   36,000 Units at 12/27/14 [phone]  . LORazepam (ATIVAN) injection 1 mg  1 mg Intravenous Q4H PRN Idelle Crouch, MD      . ondansetron [facility]) tablet 4 mg  4 mg Oral Q6H PRN Idelle Crouch, MD       Or  . ondansetron [facility]) injection 4 mg  4 mg Intravenous Q6H PRN Idelle Crouch, MD   4 mg at 12/26/14 1825  . pantoprazole (PROTONIX) injection 40 mg  40 mg Intravenous Q12H Idelle Crouch, MD   40 mg at 12/27/14 1018  . polyethylene glycol (MIRALAX / GLYCOLAX) packet 17 g  17 g Oral Daily PRN Idelle Crouch, MD      . potassium chloride SA (K-DUR,KLOR-CON) CR tablet 20 mEq  20 mEq Oral Daily Idelle Crouch, MD   20 mEq at 12/27/14 1019    Past Medical History  Diagnosis Date  . Pancreatitis   . Hepatitis C   . Femur fracture   . Diabetes mellitus, type 2 11/01/2014  . Benign hypertension 11/01/2014    Past Surgical History  Procedure Laterality Date  . Total hip arthroplasty Left 1981  . Abdominal hysterectomy    .  Appendectomy    . Cholecystectomy    . Intracapsular cataract extraction Left 2005  . Eus N/A 11/18/2014    Procedure: ESOPHAGEAL ENDOSCOPIC ULTRASOUND (EUS) RADIAL;  Surgeon: Cora Daniels, MD;  Location: [facility] ENDOSCOPY;  Service: Endoscopy;  Laterality: N/A;  . Orif of left femur fracture  04/2007    Social History History  Substance Use Topics  . Smoking status: Never Smoker   . Smokeless tobacco: Never Used  . Alcohol Use: 0.0 oz/week    0 Standard drinks or equivalent per week     Comment: Infrequent use; special occasion;   lives at home.  Family History Family History  Problem Relation Age of Onset  . Hypertension Mother    no known family history of bleeding disorders, clotting disorders, or aneurysms.  Allergies  Allergen Reactions  . Aspirin Other (See Comments)    Stomach problems  .  Omeprazole Nausea Only     REVIEW OF SYSTEMS (Negative unless checked)  Constitutional: [] Weight loss  [] Fever  [] Chills Cardiac: [] Chest pain   [] Chest pressure   [] Palpitations   [] Shortness of breath when laying flat   [] Shortness of breath at rest   [] Shortness of breath with exertion. Vascular:  [] Pain in legs with walking   [] Pain in legs at rest   [] Pain in legs when laying flat   [] Claudication   [] Pain in feet when walking  [] Pain in feet at rest  [] Pain in feet when laying flat   [] History of DVT   [] Phlebitis   [x] Swelling in legs   [] Varicose veins   [] Non-healing ulcers Pulmonary:   [] Uses home oxygen   [] Productive cough   [] Hemoptysis   [] Wheeze  [] COPD   [] Asthma Neurologic:  [] Dizziness  [] Blackouts   [] Seizures   [] History of stroke   [] History of TIA  [] Aphasia   [] Temporary blindness   [] Dysphagia   [] Weakness or numbness in arms   [] Weakness or numbness in legs Musculoskeletal:  [] Arthritis   [x] Joint swelling   [x] Joint pain   [] Low back pain Hematologic:  [x] Easy bruising  [] Easy bleeding   [] Hypercoagulable state   [] Anemic  [] Hepatitis Gastrointestinal:  [] Blood in stool   [] Vomiting blood  [] Gastroesophageal reflux/heartburn   [] Difficulty swallowing. Genitourinary:  [] Chronic kidney disease   [] Difficult urination  [] Frequent urination  [] Burning with urination   [] Blood in urine Skin:  [] Rashes   [] Ulcers   [] Wounds Psychological:  [] History of anxiety   []  History of major depression.  Physical Examination  Filed Vitals:   12/26/14 2058 12/27/14 0500 12/27/14 0516 12/27/14 1020  BP: 152/70  129/65   Pulse: 94  86 97  Temp: 98.7 F (37.1 C)  98.5 F (36.9 C)   TempSrc: Oral  Oral   Resp: 18  20   Height:      Weight:  154 lb 11.2 oz (70.171 kg)    SpO2: 100%  100% 93%   Body mass index is 28.29 kg/(m^2). Gen:  WD/WN, NAD Head: [place]/AT, No temporalis wasting. Prominent temp pulse not noted. Ear/Nose/Throat: Hearing grossly intact, nares w/o erythema or  drainage, oropharynx w/o Erythema/Exudate Eyes: PERRLA, EOMI.  Neck: Supple, no nuchal rigidity.  No bruit or JVD.  Pulmonary:  Good air movement, clear to auscultation bilaterally.  Cardiac: RRR, normal S1, S2, no Murmurs, rubs or gallops. Vascular:  Vessel Right Left  Radial Palpable Palpable  Ulnar Palpable Palpable  Brachial Palpable Palpable  Carotid Palpable, without bruit Palpable, without bruit  Aorta Not palpable  N/A  Femoral Palpable Palpable  Popliteal Palpable Palpable  PT Palpable Palpable  DP Palpable Palpable   Gastrointestinal: soft, non-tender/non-distended. No guarding/reflex. No masses, surgical incisions, or scars. Musculoskeletal: . left lower extremity weaker than the right. Arm strength symmetric.  Extremities without ischemic changes.  No deformity or atrophy. Left lower extremity with 2+ edema. Left thigh swollen and bruised but no impending skin loss. Neurologic: CN 2-12 intact. Pain and light touch intact in extremities.  Symmetrical.  Speech is fluent. Motor exam as above Psychiatric: Judgment intact, Mood & affect appropriate for pt's clinical situation. Dermatologic: No rashes or ulcers noted.  No cellulitis or open wounds. Lymph : No Cervical, Axillary, or Inguinal lymphadenopathy.    CBC Lab Results  Component Value Date   WBC 5.6 12/27/2014   HGB 8.4* 12/27/2014   HCT 25.8* 12/27/2014   MCV 95.0 12/27/2014   PLT 186 12/27/2014    BMET    Component Value Date/Time   NA 140 12/27/2014 0508   K 3.9 12/27/2014 0508   CL 108 12/27/2014 0508   CO2 27 12/27/2014 0508   GLUCOSE 137* 12/27/2014 0508   BUN 14 12/27/2014 0508   CREATININE 0.37* 12/27/2014 0508   CALCIUM 8.2* 12/27/2014 0508   GFRNONAA >60 12/27/2014 0508   GFRAA >60 12/27/2014 0508   Estimated Creatinine Clearance: 48.9 mL/min (by C-G formula based on Cr of 0.37).  COAG Lab Results  Component Value Date   INR 1.08 12/27/2014   INR 1.08 12/24/2014   INR 1.01 10/19/2014     Radiology Hematoma left thigh present. Previous abdominal CT showing pancreatic mass and portal vein thrombosis.  Assessment/Plan Portal vein thrombosis in the face of pancreatic cancer: Has had major bleeding with anticoagulation and this will have to be stopped. At this point, would not really consider restarting anticoagulation for her portal vein thrombosis. A pancreatic cancer with a portal vein thrombosis has a very poor prognosis. There is really no risk of embolization. This is a difficult and severe problem and she is unlikely to be cured of this.  Left thigh hematoma: Agree with conservative management with ice and elevation. No impending skin loss and no surgical therapy would be recommended at this point.   DEW,JASON, MD  12/27/2014 11:25 AM

## 2014-12-27 NOTE — Progress Notes (Addendum)
Dr Ginette Pitman full liquid diet

## 2014-12-28 DIAGNOSIS — K869 Disease of pancreas, unspecified: Secondary | ICD-10-CM

## 2014-12-28 DIAGNOSIS — I81 Portal vein thrombosis: Secondary | ICD-10-CM

## 2014-12-28 LAB — CBC WITH DIFFERENTIAL/PLATELET
Basophils Absolute: 0 10*3/uL (ref 0–0.1)
Basophils Relative: 1 %
Eosinophils Absolute: 0.2 10*3/uL (ref 0–0.7)
Eosinophils Relative: 3 %
HCT: 24.9 % — ABNORMAL LOW (ref 35.0–47.0)
Hemoglobin: 8.3 g/dL — ABNORMAL LOW (ref 12.0–16.0)
Lymphocytes Relative: 11 %
Lymphs Abs: 0.5 10*3/uL — ABNORMAL LOW (ref 1.0–3.6)
MCH: 31.8 pg (ref 26.0–34.0)
MCHC: 33.4 g/dL (ref 32.0–36.0)
MCV: 95.3 fL (ref 80.0–100.0)
Monocytes Absolute: 0.7 10*3/uL (ref 0.2–0.9)
Monocytes Relative: 14 %
Neutro Abs: 3.5 10*3/uL (ref 1.4–6.5)
Neutrophils Relative %: 71 %
Platelets: 181 10*3/uL (ref 150–440)
RBC: 2.61 MIL/uL — ABNORMAL LOW (ref 3.80–5.20)
RDW: 14.8 % — ABNORMAL HIGH (ref 11.5–14.5)
WBC: 4.9 10*3/uL (ref 3.6–11.0)

## 2014-12-28 LAB — COMPREHENSIVE METABOLIC PANEL WITH GFR
ALT: 24 U/L (ref 14–54)
AST: 40 U/L (ref 15–41)
Albumin: 2.5 g/dL — ABNORMAL LOW (ref 3.5–5.0)
Alkaline Phosphatase: 43 U/L (ref 38–126)
Anion gap: 8 (ref 5–15)
BUN: 10 mg/dL (ref 6–20)
CO2: 26 mmol/L (ref 22–32)
Calcium: 8 mg/dL — ABNORMAL LOW (ref 8.9–10.3)
Chloride: 104 mmol/L (ref 101–111)
Creatinine, Ser: 0.38 mg/dL — ABNORMAL LOW (ref 0.44–1.00)
GFR calc Af Amer: >60 mL/min
GFR calc non Af Amer: >60 mL/min
Glucose, Bld: 130 mg/dL — ABNORMAL HIGH (ref 65–99)
Potassium: 3.6 mmol/L (ref 3.5–5.1)
Sodium: 138 mmol/L (ref 135–145)
Total Bilirubin: 1.2 mg/dL (ref 0.3–1.2)
Total Protein: 5.4 g/dL — ABNORMAL LOW (ref 6.5–8.1)

## 2014-12-28 LAB — GLUCOSE, CAPILLARY
GLUCOSE-CAPILLARY: 135 mg/dL — AB (ref 65–99)
GLUCOSE-CAPILLARY: 237 mg/dL — AB (ref 65–99)
Glucose-Capillary: 190 mg/dL — ABNORMAL HIGH (ref 65–99)
Glucose-Capillary: 196 mg/dL — ABNORMAL HIGH (ref 65–99)

## 2014-12-28 NOTE — Progress Notes (Signed)
Physical Therapy Treatment Patient Details Name: [name] MRN: [id number] DOB: Dec 02, 1928 Today's Date: 12/28/2014    History of Present Illness [name] is a 81 y.o. female has a past medical history significant for HTN, DM, pancreatitis with presumed pancreatic CA with recent portal vein thrombosis on Lovenox now with 2 week hx of worsening pain and swelling of left thigh. Unable to walk due to pain. Brought to ER where pt was found to be more anemic with large bleed into the left thigh. Given multiple doses of IV pain meds in ER with persistent pain and inability to ambulate. She is now admitted for further evaluation. Pt reports that prior to onset of LLE pain she was a limited community ambulator without assistive device. She reports 1 fall in the last 12 months but not recently.     PT Comments    Pt tolerating treatment session well, motivated and able to complete some of sesssion as planned, but lunch tray arrived during treatment and pt is reported very hungry. Pt continues to make limited progress toward goals as evidenced by improved pain control. Pt's greatest limitation continues to be pain c ROM which continues to limit ability to perform all mobility at baseline function. Patient presenting with impairment of strength, pain, range of motion, balance, and activity tolerance, limiting ability to perform ADL and mobility tasks at  baseline level of function. Patient will benefit from skilled intervention to address the above impairments and limitations, in order to restore to prior level of function, improve patient safety upon discharge, and to decrease caregiver burden.    Follow Up Recommendations  SNF     Equipment Recommendations  None recommended by PT    Recommendations for Other Services       Precautions / Restrictions Precautions Precautions: Fall Restrictions Weight Bearing Restrictions: No    Mobility  Bed Mobility Overal bed mobility:  (Pt  received up in chair, not motivated to participate in mobility/AMB, but willingto perform exercises in chair. )                Transfers                    Ambulation/Gait                 Stairs            Wheelchair Mobility    Modified Rankin (Stroke Patients Only)       Balance                                    Cognition Arousal/Alertness: Awake/alert Behavior During Therapy: WFL for tasks assessed/performed Overall Cognitive Status: Within Functional Limits for tasks assessed                      Exercises General Exercises - Lower Extremity Ankle Circles/Pumps: AROM;Both;15 reps;Seated Short Arc Quad: Seated;AAROM;Left;10 reps Hip ABduction/ADduction: AAROM;Seated;Left;10 reps Straight Leg Raises: AAROM;Seated;Left;10 reps (req mod-max A)    General Comments        Pertinent Vitals/Pain Pain Score: 3  Pain Location: L thigh  Pain Intervention(s): Monitored during session;Limited activity within patient's tolerance;Premedicated before session    Home Living                      Prior Function  PT Goals (current goals can now be found in the care plan section) Acute Rehab PT Goals Patient Stated Goal: "I want to get back home" PT Goal Formulation: With patient Time For Goal Achievement: 01/10/15 Potential to Achieve Goals: Good Progress towards PT goals: Progressing toward goals    Frequency  Min 2X/week    PT Plan      Co-evaluation             End of Session   Activity Tolerance: Patient limited by pain Patient left: in chair;with chair alarm set;with call bell/phone within reach     Time: 1155-1204 PT Time Calculation (min) (ACUTE ONLY): 9 min  Charges:  $Therapeutic Exercise: 8-22 mins                    G Codes:      [name] C 12/31/2014, 12:16 PM 12:18 PM  Etta Grandchild, PT, DPT [place] License # [id number]

## 2014-12-28 NOTE — Care Management Important Message (Signed)
Important Message  Patient Details  Name: [name] MRN: [id number] Date of Birth: 11/12/1928   Medicare Important Message Given:  Yes-second notification given    Juliann Pulse A Allmond 12/28/2014, 11:23 AM

## 2014-12-28 NOTE — Clinical Social Work Note (Signed)
CSW provided bed offers. Pt only wants to go to [facility], which did not make a bed offer. Pt stated that she will speak with her daughter this evening about choice. CSW updated RNCM. Pt will be ready for discharge on 7/13, per MD. Pt will likely discharge home with home health. CSW will continue to follow.   Darden Dates, MSW, LCSW Clinical Social Worker  [phone]

## 2014-12-28 NOTE — Progress Notes (Signed)
   12/28/14 1310  Clinical Encounter Type  Visited With Patient  Visit Type Initial  Consult/Referral To Chaplain  Stress Factors  Patient Stress Factors Health changes  pt told me she is in pain and wants a nurse to give her medicine for it.  Pt told me that she will be doing better once the pain goes away and she can go home.  Pt said she appreciated my coming to visit wit her.  [place] [phone]

## 2014-12-28 NOTE — Plan of Care (Signed)
Problem: Discharge Progression Outcomes Goal: Other Discharge Outcomes/Goals Outcome: Progressing Plan of care progress to goal for: 1. Pain-pt c.o pain, prn meds given with improvement 2. Hemodynamically-VSS, pt remains afebrile this shift 3. Complications-no evidence of this shift 4. Diet-pt tolerating diet at this time 5. Activity-pt up with 1 asssit

## 2014-12-28 NOTE — Progress Notes (Signed)
PROGRESS NOTE  [name] FEO:[id number] DOB: 1929-06-09 DOA: 12/26/2014 PCP: Tracie Harrier, MD  HPI/Recap of past 24 hours: 81 y/o f with hx of Pancreatic mass, HTN, Recent portal vein thrombosis,- On Lovnox admitted with 2 week hx of swelling and pain in left thigh. This am feels better and states pain is controlled   Consultants: Dr. Lucky Cowboy- Vascular   Objective: BP 118/60 mmHg  Pulse 87  Temp(Src) 98.2 F (36.8 C) (Oral)  Resp 18  Ht 5\' 2"  (1.575 m)  Wt 71.578 kg (157 lb 12.8 oz)  BMI 28.85 kg/m2  SpO2 96%  Intake/Output Summary (Last 24 hours) at 12/28/14 0821 Last data filed at 12/28/14 0525  Gross per 24 hour  Intake 2264.5 ml  Output     50 ml  Net 2214.5 ml   Filed Weights   12/26/14 0932 12/27/14 0500 12/28/14 0500  Weight: 65.318 kg (144 lb) 70.171 kg (154 lb 11.2 oz) 71.578 kg (157 lb 12.8 oz)    Exam: Alert and oriented. HEENT: [place], AT  General:  Anxious  Cardiovascular: S1 S2  Respiratory: Clear to auscultation  Abdomen: Soft   Extremities; Left thigh warm with mild tenderness   Neuro:Non Focal  Data Reviewed: Basic Metabolic Panel:  Recent Labs Lab 12/24/14 0015 12/26/14 1045 12/27/14 0508 12/28/14 0725  NA 139 135 140 138  K 3.7 3.9 3.9 3.6  CL 106 102 108 104  CO2 23 24 27 26   GLUCOSE 190* 200* 137* 130*  BUN 18 17 14 10   CREATININE 0.45 0.47 0.37* 0.38*  CALCIUM 9.1 8.8* 8.2* 8.0*  MG  --  1.7  --   --    Liver Function Tests:  Recent Labs Lab 12/26/14 1045 12/27/14 0508 12/28/14 0725  AST 45* 39 40  ALT 30 25 24   ALKPHOS 47 44 43  BILITOT 0.8 0.7 1.2  PROT 6.6 5.4* 5.4*  ALBUMIN 3.3* 2.9* 2.5*   No results for input(s): LIPASE, AMYLASE in the last 168 hours. No results for input(s): AMMONIA in the last 168 hours. CBC:  Recent Labs Lab 12/24/14 0015 12/26/14 1045 12/27/14 0508 12/28/14 0725  WBC 5.9 8.5 5.6 4.9  NEUTROABS  --  6.9*  --  3.5  HGB 11.3* 9.8* 8.4* 8.3*  HCT 34.4* 30.1* 25.8* 24.9*   MCV 94.9 95.0 95.0 95.3  PLT 179 239 186 181   Cardiac Enzymes:    Recent Labs Lab 12/24/14 0015  CKTOTAL 82   BNP (last 3 results) No results for input(s): BNP in the last 8760 hours.  ProBNP (last 3 results) No results for input(s): PROBNP in the last 8760 hours.  CBG:  Recent Labs Lab 12/27/14 0738 12/27/14 1128 12/27/14 1615 12/27/14 2151 12/28/14 0712  GLUCAP 130* 149* 140* 138* 135*    No results found for this or any previous visit (from the past 240 hour(s)).   Studies: No results found.  Scheduled Meds: . docusate sodium  100 mg Oral BID  . famotidine  20 mg Oral BID  . insulin aspart  0-9 Units Subcutaneous TID WC  . lipase/protease/amylase  36,000 Units Oral TID WC  . pantoprazole  40 mg Oral BID  . potassium chloride SA  20 mEq Oral Daily    Continuous Infusions: . sodium chloride 75 mL/hr at 12/28/14 3254    Assessment/Plan: 1 Left Thigh hematoma: Most likely from Lovenox Symptomatically better with conservative mnagement. No operative intervention needed. 2 Hx of Portal vein Thrombosis: Hold off on anticoagulation 3  Hx of Pancreatitis/Possible mass; On Pancreatic enzymes 4 Type 2 DM: Continue insulin 5 Anemia; Continue to monitor 6 Physical Therapy  Code Status: Full        [name]   12/28/2014, 8:21 AM  LOS: 2 days

## 2014-12-28 NOTE — Telephone Encounter (Signed)
Message sent to Dr Mike Gip

## 2014-12-28 NOTE — Plan of Care (Signed)
Problem: Discharge Progression Outcomes Goal: Other Discharge Outcomes/Goals Outcome: Progressing Plan of Care Progressing to Goal: Pain medication given with noted relief. VSS. Polar care on left leg. Tolerating diet. Worked with physical therapy.

## 2014-12-29 ENCOUNTER — Encounter
Admission: RE | Admit: 2014-12-29 | Discharge: 2014-12-29 | Disposition: A | Discharge status: Home / Self Care | Payer: Commercial Managed Care - HMO | Source: Ambulatory Visit | Attending: Internal Medicine | Admitting: Internal Medicine

## 2014-12-29 ENCOUNTER — Ambulatory Visit: Payer: Commercial Managed Care - HMO | Admitting: Hematology and Oncology

## 2014-12-29 DIAGNOSIS — D649 Anemia, unspecified: Secondary | ICD-10-CM | POA: Insufficient documentation

## 2014-12-29 LAB — BASIC METABOLIC PANEL
Anion gap: 7 (ref 5–15)
BUN: 10 mg/dL (ref 6–20)
CO2: 27 mmol/L (ref 22–32)
Calcium: 8.1 mg/dL — ABNORMAL LOW (ref 8.9–10.3)
Chloride: 106 mmol/L (ref 101–111)
Creatinine, Ser: 0.36 mg/dL — ABNORMAL LOW (ref 0.44–1.00)
GFR calc Af Amer: >60 mL/min (ref >60)
GFR calc non Af Amer: >60 mL/min (ref >60)
Glucose, Bld: 174 mg/dL — ABNORMAL HIGH (ref 65–99)
Potassium: 3.9 mmol/L (ref 3.5–5.1)
SODIUM: 140 mmol/L (ref 135–145)

## 2014-12-29 LAB — CBC WITH DIFFERENTIAL/PLATELET
Basophils Absolute: 0 10*3/uL (ref 0–0.1)
Basophils Relative: 1 %
Eosinophils Absolute: 0.2 10*3/uL (ref 0–0.7)
Eosinophils Relative: 5 %
HCT: 25 % — ABNORMAL LOW (ref 35.0–47.0)
HEMOGLOBIN: 8.2 g/dL — AB (ref 12.0–16.0)
Lymphocytes Relative: 12 %
Lymphs Abs: 0.5 10*3/uL — ABNORMAL LOW (ref 1.0–3.6)
MCH: 31.3 pg (ref 26.0–34.0)
MCHC: 32.6 g/dL (ref 32.0–36.0)
MCV: 95.8 fL (ref 80.0–100.0)
MONOS PCT: 14 %
Monocytes Absolute: 0.6 10*3/uL (ref 0.2–0.9)
NEUTROS ABS: 3 10*3/uL (ref 1.4–6.5)
NEUTROS PCT: 68 %
Platelets: 201 10*3/uL (ref 150–440)
RBC: 2.61 MIL/uL — AB (ref 3.80–5.20)
RDW: 15.3 % — AB (ref 11.5–14.5)
WBC: 4.3 10*3/uL (ref 3.6–11.0)

## 2014-12-29 LAB — GLUCOSE, CAPILLARY
GLUCOSE-CAPILLARY: 155 mg/dL — AB (ref 65–99)
GLUCOSE-CAPILLARY: 205 mg/dL — AB (ref 65–99)

## 2014-12-29 MED ORDER — ACETAMINOPHEN 650 MG RE SUPP: 650.0000 mg | Freq: Four times a day (QID) | RECTAL | Status: DC | PRN

## 2014-12-29 MED ORDER — DOCUSATE SODIUM 100 MG PO CAPS: 100.0000 mg | ORAL_CAPSULE | Freq: Two times a day (BID) | ORAL | Status: DC

## 2014-12-29 MED ORDER — POLYETHYLENE GLYCOL 3350 17 G PO PACK
17.0000 g | PACK | Freq: Every day | ORAL | Status: DC
  Administered 2014-12-29: 15:00:00 17 g via ORAL

## 2014-12-29 NOTE — Plan of Care (Signed)
Problem: Discharge Progression Outcomes Goal: Other Discharge Outcomes/Goals Outcome: Progressing Plan of care progress to goal for: 1. Pain-no c/o pain this shift 2. Hemodynamically-VSS, pt remains afebrile this shift 3. Complications-no evidence of this shift 4. Diet-pt tolerating diet at this time 5. Activity-pt up with 1 asssit

## 2014-12-29 NOTE — Discharge Summary (Signed)
Physician Discharge Summary  [name] HFW:[id number] DOB: 02/16/1929 DOA: 12/26/2014  PCP: Tracie Harrier, MD  Admit date: 12/26/2014 Discharge date: 12/29/2014  Time spent: 45 minutes  Recommendations for Outpatient Follow-up:  1. Follow up with Dr. Ginette Pitman 2. 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 Patient had been receiving Lovenox injections for her splenic and portal vein thrombosis CT scan showed a large intramuscular thrombus  in anterior thigh area    Hospital Course:  Patient was admitted [facility] was DC'd . She was seen in consultation by Dr. Lucky Cowboy hematologist and also Dr. Mike Gip Oncologist . It was felt that we should hold off on further anticoagulation given the risk benefit ratio . She was also seen by Physical therapy . Her pain was controlled with narcotic analgesia . It was felt that she would benefit from a physical therapy and rehabilitation and she was transferred in stable condition to [facility] rehabilitation facility  Patient will follow-up in the clinic with oncologist Dr. Nolon Stalls  and also with me Dr. Ginette Pitman in 2-3 weeks time   Consultations: Oncology Vascular   Discharge Exam: Filed Vitals:   12/29/14 0952  BP: 130/53  Pulse: 85   Temp: 98.2 F (36.8 C)  Resp: 20    General: Not in distress Cardiovascular: S1 S2 Respiratory: Clear to auscultation Extremities; Tenderness + with swelling Left thigh  Discharge Instructions  Call office to make appt in 2-3  weeks    Current Discharge Medication List    START taking these medications   Details  acetaminophen (TYLENOL) 650 MG suppository Place 1 suppository (650 mg total) rectally every 6 (six) hours as needed for mild pain (or Fever >/= 101). Qty: 12 suppository, Refills: 0    !! docusate sodium (COLACE) 100 MG capsule Take 1 capsule (100 mg total) by mouth 2 (two) times daily. Qty: 10 capsule, Refills: 0     !! - Potential duplicate medications found. Please discuss with provider.    CONTINUE these medications which have NOT CHANGED   Details  acetaminophen (TYLENOL) 500 MG tablet Take 1,000 mg by mouth every 6 (six) hours as needed for moderate pain.    !! docusate sodium (COLACE) 100 MG capsule Take 1 capsule (100 mg total) by mouth 2 (two) times daily. Qty: 10 capsule, Refills: 0    lipase/protease/amylase (CREON) 12000 UNITS CPEP capsule Take 36,000 Units by mouth 3 (three) times daily with meals.    metFORMIN (GLUCOPHAGE) 500 MG tablet Take 1 tablet by mouth daily.    ondansetron (ZOFRAN-ODT) 8 MG disintegrating tablet Take 8 mg by mouth every 8 (eight) hours as needed for nausea.     oxyCODONE-acetaminophen (PERCOCET) 7.5-325 MG per tablet Take 1 tablet by mouth every 4 (four) hours as needed for  severe pain. Qty: 30 tablet, Refills: 0    polyethylene glycol (MIRALAX / GLYCOLAX) packet Take 17 g by mouth daily. Qty: 14 each, Refills: 0    potassium chloride SA (K-DUR,KLOR-CON) 20 MEQ tablet Take 20 mEq by mouth daily.    ranitidine (ZANTAC) 150 MG tablet Take 150 mg by mouth 2 (two) times daily.    alum & mag hydroxide-simeth (MAALOX/MYLANTA) 200-200-20 MG/5ML suspension Take 30 mLs by mouth every 4 (four) hours as needed for indigestion  or heartburn (dyspepsia). Qty: 355 mL, Refills: 0    omeprazole (PRILOSEC) 20 MG capsule Take 1 capsule (20 mg total) by mouth daily. Qty: 30 capsule, Refills: 2     !! - Potential duplicate medications found. Please discuss with provider.    STOP taking these medications     enoxaparin (LOVENOX) 80 MG/0.8ML injection        Allergies  Allergen Reactions  . Aspirin Other (See Comments)    Stomach problems  . Omeprazole Nausea Only      The results of significant diagnostics from this hospitalization (including imaging, microbiology, ancillary and laboratory) are listed below for reference.    Significant Diagnostic Studies: Ct Femur Left W Contrast  12/26/2014   CLINICAL DATA:  Left leg pain since yesterday.  EXAM: CT OF THE LOWER LEFT EXTREMITY WITHOUT CONTRAST  TECHNIQUE: Multidetector CT imaging of the left lower extremity was performed according to the standard protocol.  COMPARISON:  None.  FINDINGS: There are remote surgical changes involving the left hip and thigh with a total left hip arthroplasty and a long sideplate and multiple screws involving the distal femur. Probable extruded bone-cement involving both the medial and lateral aspects of the upper femoral shaft. The prosthesis is intact. The cemented acetabular cup is intact.  There is enlargement of the left thigh musculature with what looks like a large complex hematoma. Some of these areas appear liquified. This is in the anterior thigh compartment and begins at the level of the hip and extends all the way down to the mid thigh.  IMPRESSION: Intact left hip and femur hardware without complicating features.  Suspect large intermuscular hematoma involving the anterior thigh.   Electronically Signed   By: Marijo Sanes M.D.   On: 12/26/2014 12:33   Nm Pet Image Initial (pi) Skull Base To Thigh  12/10/2014   CLINICAL DATA:  Initial treatment strategy for pancreatic mass.  EXAM: NUCLEAR MEDICINE PET SKULL BASE TO THIGH   TECHNIQUE: 12.4 mCi F-18 FDG was injected intravenously. Full-ring PET imaging was performed from the skull base to thigh after the radiotracer. CT data was obtained and used for attenuation correction and anatomic localization.  FASTING BLOOD GLUCOSE:  Value: 196 mg/dl  COMPARISON:  MRI 10/21/2014, CT 10/19/2014  FINDINGS: NECK  No hypermetabolic lymph nodes in the neck. On the first image there is a calcific mass extending along the medial aspect of the left middle cranial fossa abutting the skull base measuring 3 cm by 1.7 cm (image number 1). There is a significant metabolic axis activity associated with this a calcific mass.  CHEST  No hypermetabolic mediastinal lymph nodes. Small subpleural nodule in the right lower lobe measures 4 mm on image 117 of series 3. Nodule along the right horizontal fissure is elongated with a benign morphology measuring 4 mm on image 113 series 3.  ABDOMEN/PELVIS  There is no discrete hypermetabolic mass associated with the pancreas. The pancreatic body and ducts are difficult to define. There is 1  focus of metabolic activity above background in the region of pancreatic head measuring 1 cm on image 138 of data set. The activity of this focus is mild with SUV max 2.6.  There is again demonstrated extensive thrombus within the portal vein, splenic vein and superior mesenteric vein. This thrombus is not hypermetabolic suggesting bland thrombus. There is no abnormal metabolic activity in the liver. No hypermetabolic periportal lymph nodes.  There is evidence of portal hypertension with venous collaterals in the splenic hilum and superior to the spleen. There is a large hiatal hernia.  No hypermetabolic abdominal pelvic lymph nodes. Hysterectomy. Adrenal glands are normal.  SKELETON  No focal hypermetabolic activity to suggest skeletal metastasis.  IMPRESSION: 1. No clear focal metabolic activity within the pancreas to suggest pancreatic adenocarcinoma. Single focus of activity in the  region of the pancreatic head is nonspecific. 2. Extensive venous thrombosis within the portal vein, superior mesenteric vein, and splenic vein with venous collateralization. Collateralization suggests a chronic process. 3. No evidence of distant metastasis. 4. Small right pulmonary nodules are likely benign. 5. Large calcific lesion in the medial left middle cranial fossa along the skullbase is likely a benign meningioma.   Electronically Signed   By: Suzy Bouchard M.D.   On: 12/10/2014 12:23   US Venous Img Lower Unilateral Left  12/23/2014   CLINICAL DATA:  Left leg pain for 1 day  EXAM: Left LOWER EXTREMITY VENOUS DOPPLER ULTRASOUND  TECHNIQUE: Gray-scale sonography with graded compression, as well as color Doppler and duplex ultrasound were performed to evaluate the lower extremity deep venous systems from the level of the common femoral vein and including the common femoral, femoral, profunda femoral, popliteal and calf veins including the posterior tibial, peroneal and gastrocnemius veins when visible. The superficial great saphenous vein was also interrogated. Spectral Doppler was utilized to evaluate flow at rest and with distal augmentation maneuvers in the common femoral, femoral and popliteal veins.  COMPARISON:  None.  FINDINGS: Contralateral Common Femoral Vein: Respiratory phasicity is normal and symmetric with the symptomatic side. No evidence of thrombus. Normal compressibility.  Common Femoral Vein: No evidence of thrombus.  Saphenofemoral Junction: No evidence of thrombus.  Profunda Femoral Vein: No evidence of thrombus.  Femoral Vein: No evidence of thrombus.  Popliteal Vein: No evidence of thrombus.  Calf Veins: No evidence of thrombus.  Superficial Great Saphenous Vein: No evidence of thrombus.  IMPRESSION: No evidence of left lower extremity deep venous thrombosis.   Electronically Signed   By: Monte Fantasia M.D.   On: 12/23/2014 23:49   Dg Femur Min 2 Views Left  12/24/2014    CLINICAL DATA:  Mid femur pain.  History of fracture.  EXAM: LEFT FEMUR 2 VIEWS  COMPARISON:  None currently available.  FINDINGS: Unusual left hip prosthesis with extensive acetabular component cement. The prosthesis is located, well seated, and appears stable when compared to PET-CT 12/10/2014. Extensive cerclage wire fixation of the greater trochanter with chronic fragmentation.  Bulky mature callus on both sides of the proximal femoral diaphysis is also stable from CT, when no discrete fracture line or abnormal metabolism was seen. Long plate and screw fixation of a distal femoral diaphysis fracture is intact. No dislocation. No acute osseous fracture  IMPRESSION: 1. No acute findings. 2. Extensive postsurgical change to the left hip and femur, as above.   Electronically Signed   By: Monte Fantasia M.D.   On: 12/24/2014 00:42    Microbiology: No results found for this or  any previous visit (from the past 240 hour(s)).   Labs: Basic Metabolic Panel:  Recent Labs Lab 12/24/14 0015 12/26/14 1045 12/27/14 0508 12/28/14 0725 12/29/14 0426  NA 139 135 140 138 140  K 3.7 3.9 3.9 3.6 3.9  CL 106 102 108 104 106  CO2 23 24 27 26 27   GLUCOSE 190* 200* 137* 130* 174*  BUN 18 17 14 10 10   CREATININE 0.45 0.47 0.37* 0.38* 0.36*  CALCIUM 9.1 8.8* 8.2* 8.0* 8.1*  MG  --  1.7  --   --   --    Liver Function Tests:  Recent Labs Lab 12/26/14 1045 12/27/14 0508 12/28/14 0725  AST 45* 39 40  ALT 30 25 24   ALKPHOS 47 44 43  BILITOT 0.8 0.7 1.2  PROT 6.6 5.4* 5.4*  ALBUMIN 3.3* 2.9* 2.5*   No results for input(s): LIPASE, AMYLASE in the last 168 hours. No results for input(s): AMMONIA in the last 168 hours. CBC:  Recent Labs Lab 12/24/14 0015 12/26/14 1045 12/27/14 0508 12/28/14 0725 12/29/14 0426  WBC 5.9 8.5 5.6 4.9 4.3  NEUTROABS  --  6.9*  --  3.5 3.0  HGB 11.3* 9.8* 8.4* 8.3* 8.2*  HCT 34.4* 30.1* 25.8* 24.9* 25.0*  MCV 94.9 95.0 95.0 95.3 95.8  PLT 179 239 186 181 201    Cardiac Enzymes:  Recent Labs Lab 12/24/14 0015  CKTOTAL 82   BNP: BNP (last 3 results) No results for input(s): BNP in the last 8760 hours.  ProBNP (last 3 results) No results for input(s): PROBNP in the last 8760 hours.  CBG:  Recent Labs Lab 12/28/14 1117 12/28/14 1614 12/28/14 2126 12/29/14 0802 12/29/14 1113  GLUCAP 237* 190* 196* 155* 205*       Signed:  Shimika Ames   12/29/2014, 1:14 PM

## 2014-12-29 NOTE — Clinical Social Work Note (Signed)
Pt has elected to go to [facility] for [employer].   CSW contacted [place] to inform them of pt's choice and initiated the [place] authorization.  Pt should be ready for d/c this afternoon pending the [place].  CSW will continue to follow and assist with d/c planning needs.  [place], [place]

## 2014-12-29 NOTE — Care Management (Signed)
Discussed discharge plans with Ms. Tones at the bedside. States she did discuss [place] with her daughter yesterday. Daughter said "just stay at the hospital since there are no beds at [facility]." Discussed with Ms. Casses that she has no medical reason to keep her in the hospital. States that will go to [facility] today. "Just let me know so my daughter can bring me some clothes." [facility] in place. Will update Clinical Social Worker and Dr. Ginette Pitman. Possible discharge today.  Shelbie Ammons RN MSN Care Management [phone]

## 2014-12-29 NOTE — Progress Notes (Signed)
Patient transported to [facility] by EMS. Madlyn Frankel, RN

## 2014-12-29 NOTE — Clinical Social Work Note (Signed)
Pt is ready for d/c today.  Pt, daughter and staff at [facility] were informed.  CSW sent d/c documentation to [facility]. Pt will be transported via EMS.  Room and report information given to RN.  CSW signing off as there are no further needs at this time.  [place], [place]

## 2014-12-29 NOTE — Progress Notes (Signed)
PROGRESS NOTE  [name] VZC:[id number] DOB: [birth year] DOA: 12/26/2014 PCP: Tracie Harrier, MD  Subjective: 81 y/o f with hx of Pancreatic mass, HTN, Recent portal vein thrombosis,- On Lovnox admitted with 2 week hx of swelling and pain in left thigh. This am feels better- but has pain on ambulation   Consultants: Dr. Lucky Cowboy- Vascular   Objective: BP 130/53 mmHg  Pulse 85  Temp(Src) 98.2 F (36.8 C) (Oral)  Resp 20  Ht 5\' 2"  (1.575 m)  Wt 72.031 kg (158 lb 12.8 oz)  BMI 29.04 kg/m2  SpO2 100%  Intake/Output Summary (Last 24 hours) at 12/29/14 1252 Last data filed at 12/29/14 0800  Gross per 24 hour  Intake    360 ml  Output    150 ml  Net    210 ml   Filed Weights   12/27/14 0500 12/28/14 0500 12/29/14 0535  Weight: 70.171 kg (154 lb 11.2 oz) 71.578 kg (157 lb 12.8 oz) 72.031 kg (158 lb 12.8 oz)    Exam: Alert and oriented. HEENT: [place], AT  General:  Anxious  Cardiovascular: S1 S2  Respiratory: Clear to auscultation  Abdomen: Soft   Extremities; Left thigh warm with mild tenderness   Neuro:Non Focal  Data Reviewed: Basic Metabolic Panel:  Recent Labs Lab 12/24/14 0015 12/26/14 1045 12/27/14 0508 12/28/14 0725 12/29/14 0426  NA 139 135 140 138 140  K 3.7 3.9 3.9 3.6 3.9  CL 106 102 108 104 106  CO2 23 24 27 26 27   GLUCOSE 190* 200* 137* 130* 174*  BUN 18 17 14 10 10   CREATININE 0.45 0.47 0.37* 0.38* 0.36*  CALCIUM 9.1 8.8* 8.2* 8.0* 8.1*  MG  --  1.7  --   --   --    Liver Function Tests:  Recent Labs Lab 12/26/14 1045 12/27/14 0508 12/28/14 0725  AST 45* 39 40  ALT 30 25 24   ALKPHOS 47 44 43  BILITOT 0.8 0.7 1.2  PROT 6.6 5.4* 5.4*  ALBUMIN 3.3* 2.9* 2.5*   No results for input(s): LIPASE, AMYLASE in the last 168 hours. No results for input(s): AMMONIA in the last 168 hours. CBC:  Recent Labs Lab 12/24/14 0015 12/26/14 1045 12/27/14 0508 12/28/14 0725 12/29/14 0426  WBC 5.9 8.5 5.6 4.9 4.3  NEUTROABS  --  6.9*   --  3.5 3.0  HGB 11.3* 9.8* 8.4* 8.3* 8.2*  HCT 34.4* 30.1* 25.8* 24.9* 25.0*  MCV 94.9 95.0 95.0 95.3 95.8  PLT 179 239 186 181 201   Cardiac Enzymes:    Recent Labs Lab 12/24/14 0015  CKTOTAL 82   BNP (last 3 results) No results for input(s): BNP in the last 8760 hours.  ProBNP (last 3 results) No results for input(s): PROBNP in the last 8760 hours.  CBG:  Recent Labs Lab 12/28/14 1117 12/28/14 1614 12/28/14 2126 12/29/14 0802 12/29/14 1113  GLUCAP 237* 190* 196* 155* 205*    No results found for this or any previous visit (from the past 240 hour(s)).   Studies: No results found.  Scheduled Meds: . docusate sodium  100 mg Oral BID  . famotidine  20 mg Oral BID  . insulin aspart  0-9 Units Subcutaneous TID WC  . lipase/protease/amylase  36,000 Units Oral TID WC  . pantoprazole  40 mg Oral BID  . potassium chloride SA  20 mEq Oral Daily    Continuous Infusions: . sodium chloride 75 mL/hr at 12/29/14 0756    Assessment/Plan: 1 Left Thigh hematoma:  Most likely from Lovenox Symptomatically better . Continue Pain management  No operative intervention needed. 2 Hx of Portal vein Thrombosis: Hold off on anticoagulation Appreciated help frpm Oncology 3 Hx of Pancreatic Ca/ Chronic pancreatitis  On Pancreatic enzymes 4 Type 2 DM: Continue insulin 5 Anemia; Continue to monitor 6 For Rehab today  Code Status: Full  Discussed case with daughter       [facility]   12/29/2014, 12:52 PM  LOS: 3 days

## 2014-12-29 NOTE — Progress Notes (Signed)
[facility] Hematology/Oncology Progress Note  Date of admission: 12/26/2014  Hospital day:  12/28/2014  Chief Complaint: [name] is a 81 y.o. female with a pancreatic mass and extensive thrombosis of the portal vein, splenic vein, and superior mesenteric vein who was admitted with a 2 week history of a painful swollen left leg and documented thigh hematoma.  Subjective:  Pain better controlled.  Social History: The patient is alone today.  Allergies:  Allergies  Allergen Reactions  . Aspirin Other (See Comments)    Stomach problems  . Omeprazole Nausea Only    Scheduled Medications: . docusate sodium  100 mg Oral BID  . famotidine  20 mg Oral BID  . insulin aspart  0-9 Units Subcutaneous TID WC  . lipase/protease/amylase  36,000 Units Oral TID WC  . pantoprazole  40 mg Oral BID  . potassium chloride SA  20 mEq Oral Daily    Review of Systems: GENERAL:  Pain improved.  No fevers, sweats or weight loss. PERFORMANCE STATUS (ECOG):  2 HEENT:  No visual changes, runny nose, sore throat, mouth sores or tenderness. Lungs: No shortness of breath or cough.  No hemoptysis. Cardiac:  No chest pain, palpitations, orthopnea, or PND. GI:  No nausea, vomiting, diarrhea, constipation, melena or hematochezia. GU:  No urgency, frequency, dysuria, or hematuria. Musculoskeletal:  Pain associated with left thigh hematoma improved.  No back pain.  No joint pain.  Extremities:  Left thigh pain or swelling (improved). Skin:  No rashes or skin changes. Neuro:  No headache, numbness or weakness, balance or coordination issues. Endocrine:  No diabetes, thyroid issues, hot flashes or night sweats. Psych:  No mood changes, depression or anxiety. Pain:  Focal pain associated with hematoma. Review of systems:  All other systems reviewed and found to be negative.   Physical Exam: Blood pressure 130/53, pulse 88, temperature 98.6 F (37 C), temperature source Oral, resp.  rate 18, height 5' 2"  (1.575 m), weight 157 lb 12.8 oz (71.578 kg), SpO2 97 %.  GENERAL:  Well developed, well nourished, sitting comfortably in the exam room in no acute distress. MENTAL STATUS:  Alert and oriented to person, place and time. EYES: Blue eyes. Pupils equal round and reactive to light and accomodation. No conjunctivitis or scleral icterus. ENT: Oropharynx clear without lesion. Tongue normal. Mucous membranes moist.  RESPIRATORY: Clear to auscultation without rales, wheezes or rhonchi. CARDIOVASCULAR: Regular rate and rhythm without murmur, rub or gallop. ABDOMEN: Soft, non-tender, with active bowel sounds, and no hepatosplenomegaly. No masses. SKIN: No rashes, ulcers or lesions. EXTREMITIES: Left leg edematous with cooling blanket.  No skin discoloration. No palpable cords. LYMPH NODES: No palpable cervical, supraclavicular, axillary or inguinal adenopathy  NEUROLOGICAL: Unremarkable. PSYCH: Appropriate.   Results for orders placed or performed during the hospital encounter of 12/26/14 (from the past 48 hour(s))  Glucose, capillary     Status: Abnormal   Collection Time: 12/27/14  7:38 AM  Result Value Ref Range   Glucose-Capillary 130 (H) 65 - 99 mg/dL  Glucose, capillary     Status: Abnormal   Collection Time: 12/27/14 11:28 AM  Result Value Ref Range   Glucose-Capillary 149 (H) 65 - 99 mg/dL  Glucose, capillary     Status: Abnormal   Collection Time: 12/27/14  4:15 PM  Result Value Ref Range   Glucose-Capillary 140 (H) 65 - 99 mg/dL  Glucose, capillary     Status: Abnormal   Collection Time: 12/27/14  9:51 PM  Result  Value Ref Range   Glucose-Capillary 138 (H) 65 - 99 mg/dL   Comment 1 Notify RN   Glucose, capillary     Status: Abnormal   Collection Time: 12/28/14  7:12 AM  Result Value Ref Range   Glucose-Capillary 135 (H) 65 - 99 mg/dL  CBC with Differential/Platelet     Status: Abnormal   Collection Time: 12/28/14  7:25 AM  Result Value Ref  Range   WBC 4.9 3.6 - 11.0 K/uL   RBC 2.61 (L) 3.80 - 5.20 MIL/uL   Hemoglobin 8.3 (L) 12.0 - 16.0 g/dL   HCT 24.9 (L) 35.0 - 47.0 %   MCV 95.3 80.0 - 100.0 fL   MCH 31.8 26.0 - 34.0 pg   MCHC 33.4 32.0 - 36.0 g/dL   RDW 14.8 (H) 11.5 - 14.5 %   Platelets 181 150 - 440 K/uL   Neutrophils Relative % 71 %   Neutro Abs 3.5 1.4 - 6.5 K/uL   Lymphocytes Relative 11 %   Lymphs Abs 0.5 (L) 1.0 - 3.6 K/uL   Monocytes Relative 14 %   Monocytes Absolute 0.7 0.2 - 0.9 K/uL   Eosinophils Relative 3 %   Eosinophils Absolute 0.2 0 - 0.7 K/uL   Basophils Relative 1 %   Basophils Absolute 0.0 0 - 0.1 K/uL  Comprehensive metabolic panel     Status: Abnormal   Collection Time: 12/28/14  7:25 AM  Result Value Ref Range   Sodium 138 135 - 145 mmol/L   Potassium 3.6 3.5 - 5.1 mmol/L   Chloride 104 101 - 111 mmol/L   CO2 26 22 - 32 mmol/L   Glucose, Bld 130 (H) 65 - 99 mg/dL   BUN 10 6 - 20 mg/dL   Creatinine, Ser 0.38 (L) 0.44 - 1.00 mg/dL   Calcium 8.0 (L) 8.9 - 10.3 mg/dL   Total Protein 5.4 (L) 6.5 - 8.1 g/dL   Albumin 2.5 (L) 3.5 - 5.0 g/dL   AST 40 15 - 41 U/L   ALT 24 14 - 54 U/L   Alkaline Phosphatase 43 38 - 126 U/L   Total Bilirubin 1.2 0.3 - 1.2 mg/dL   GFR calc non Af Amer >60 >60 mL/min   GFR calc Af Amer >60 >60 mL/min    Comment: (NOTE) The eGFR has been calculated using the CKD EPI equation. This calculation has not been validated in all clinical situations. eGFR's persistently <60 mL/min signify possible Chronic Kidney Disease.    Anion gap 8 5 - 15  Glucose, capillary     Status: Abnormal   Collection Time: 12/28/14 11:17 AM  Result Value Ref Range   Glucose-Capillary 237 (H) 65 - 99 mg/dL  Glucose, capillary     Status: Abnormal   Collection Time: 12/28/14  4:14 PM  Result Value Ref Range   Glucose-Capillary 190 (H) 65 - 99 mg/dL  Glucose, capillary     Status: Abnormal   Collection Time: 12/28/14  9:26 PM  Result Value Ref Range   Glucose-Capillary 196 (H) 65 -  99 mg/dL  CBC with Differential/Platelet     Status: Abnormal   Collection Time: 12/29/14  4:26 AM  Result Value Ref Range   WBC 4.3 3.6 - 11.0 K/uL   RBC 2.61 (L) 3.80 - 5.20 MIL/uL   Hemoglobin 8.2 (L) 12.0 - 16.0 g/dL   HCT 25.0 (L) 35.0 - 47.0 %   MCV 95.8 80.0 - 100.0 fL   MCH 31.3 26.0 - 34.0  pg   MCHC 32.6 32.0 - 36.0 g/dL   RDW 15.3 (H) 11.5 - 14.5 %   Platelets 201 150 - 440 K/uL   Neutrophils Relative % 68 %   Neutro Abs 3.0 1.4 - 6.5 K/uL   Lymphocytes Relative 12 %   Lymphs Abs 0.5 (L) 1.0 - 3.6 K/uL   Monocytes Relative 14 %   Monocytes Absolute 0.6 0.2 - 0.9 K/uL   Eosinophils Relative 5 %   Eosinophils Absolute 0.2 0 - 0.7 K/uL   Basophils Relative 1 %   Basophils Absolute 0.0 0 - 0.1 K/uL   No results found.  Assessment:  Cagney Steenson is a 81 y.o. female with a pancreatic mass and extensive thrombosis of the portal vein, splenic and mesenteric vein presenting with abdominal pain.   Abdominal MRI on 10/21/2014 revealed thrombosis in the right and left portal vein, main portal vein, splenic vein and superior mesenteric vein. There was a 2.8 x 2.3 cm infiltrative pancreatic head mass causing stricture of the distal common bile duct. There was edema at the root of the mesentery. CA 19-9 was 329 on 10/21/2014.  Endoscopic ultrasound and biopsy on 11/18/2014 revealed a long segment of Barrett's esophagus (15 cm).  There was an irregular hypoechoic mass in genu of the pancreas measuring 3.7 x 2.1 cm. Margins were irregular suggesting invasion into the portal vein. A fine needle aspirate revealed scant fragments of mucinous epithelium with minimal atypia.  Endoscopic staging staging suggested a T2N0 lesion.  She was seen at the biliary clinic at [facility].  Repeat biopsy was recommended given the suspicion for pancreatic cancer.  The patient declined.  She developed a large hematoma in the left leg likely due to Lovenox.  She denies any trauma.  Injections were given in  the abdomen.  She is now off anticoagulation.  Symptomatically, her pain is better.  Plan: 1. Discuss likely diagnosis of pancreatic cancer given elevated CA19-9, pancreatic mass, and extensive clot.  Patient adamantly declines biopsy. She is not convinced that she has cancer or wants treatment if she has cancer.  Discuss plan for repeat imaging as an outpatient.  Patient is agreeable. 2. Agree with holding anticoagulation.  Will discuss with Dr. Lucky Cowboy when she may be a candidate for anticoagulation in the future.  Consider monitoring anti-Xa levels.  Thank you for allowing me to participate in Ms Viets's care.  I will follow her in the outpatient department after discharge.  Lequita Asal, MD  12/29/2014, 5:22 AM

## 2014-12-29 NOTE — Clinical Social Work Placement (Signed)
   CLINICAL SOCIAL WORK PLACEMENT  NOTE  Date:  12/29/2014  Patient Details  Name: [name] MRN: [id number] Date of Birth: May 04, 1929  Clinical Social Work is seeking post-discharge placement for this patient at the [place] level of care (*CSW will initial, date and re-position this form in  chart as items are completed):  Yes   Patient/family provided with [facility] Work Department's list of facilities offering this level of care within the geographic area requested by the patient (or if unable, by the patient's family).  Yes   Patient/family informed of their freedom to choose among providers that offer the needed level of care, that participate in Medicare, Medicaid or managed care program needed by the patient, have an available bed and are willing to accept the patient.  Yes   Patient/family informed of [facility]'s ownership interest in [facility] and [facility], as well as of the fact that they are under no obligation to receive care at these facilities.  PASRR submitted to EDS on       PASRR number received on       Existing PASRR number confirmed on 12/29/14     FL2 transmitted to all facilities in geographic area requested by pt/family on 12/27/14     FL2 transmitted to all facilities within larger geographic area on       Patient informed that his/her managed care company has contracts with or will negotiate with certain facilities, including the following:        Yes   Patient/family informed of bed offers received.  Patient chooses bed at [facility]     Physician recommends and patient chooses bed at      Patient to be transferred to [facility] on 12/29/14.  Patient to be transferred to facility by EMS     Patient family notified on 12/29/14 of transfer.  Name of family member notified:  daughter     PHYSICIAN       Additional Comment:     _______________________________________________ Naida Sleight, LCSW 12/29/2014, 2:08 PM

## 2014-12-29 NOTE — Progress Notes (Signed)
Report called to [facility] at [facility]. EMS called for transport. Daughter made aware. Madlyn Frankel, RN

## 2015-01-04 DIAGNOSIS — D649 Anemia, unspecified: Secondary | ICD-10-CM | POA: Diagnosis not present

## 2015-01-04 LAB — CBC WITH DIFFERENTIAL/PLATELET
Basophils Absolute: 0.1 10*3/uL (ref 0–0.1)
Basophils Relative: 1 %
EOS ABS: 0.3 10*3/uL (ref 0–0.7)
Eosinophils Relative: 5 %
HCT: 29.6 % — ABNORMAL LOW (ref 35.0–47.0)
HEMOGLOBIN: 9.6 g/dL — AB (ref 12.0–16.0)
LYMPHS ABS: 0.6 10*3/uL — AB (ref 1.0–3.6)
LYMPHS PCT: 10 %
MCH: 30.3 pg (ref 26.0–34.0)
MCHC: 32.4 g/dL (ref 32.0–36.0)
MCV: 93.7 fL (ref 80.0–100.0)
MONO ABS: 0.8 10*3/uL (ref 0.2–0.9)
MONOS PCT: 13 %
NEUTROS ABS: 4.2 10*3/uL (ref 1.4–6.5)
NEUTROS PCT: 71 %
Platelets: 294 10*3/uL (ref 150–440)
RBC: 3.16 MIL/uL — AB (ref 3.80–5.20)
RDW: 15.4 % — ABNORMAL HIGH (ref 11.5–14.5)
WBC: 6 10*3/uL (ref 3.6–11.0)

## 2015-01-05 ENCOUNTER — Inpatient Hospital Stay: Payer: Commercial Managed Care - HMO | Attending: Hematology and Oncology | Admitting: Hematology and Oncology

## 2015-01-05 ENCOUNTER — Ambulatory Visit: Payer: Commercial Managed Care - HMO | Admitting: Hematology and Oncology

## 2015-01-05 VITALS — BP 134/63 | HR 70 | Temp 97.8°F | Resp 18 | Ht 62.0 in | Wt 144.3 lb

## 2015-01-05 DIAGNOSIS — I10 Essential (primary) hypertension: Secondary | ICD-10-CM | POA: Insufficient documentation

## 2015-01-05 DIAGNOSIS — I81 Portal vein thrombosis: Secondary | ICD-10-CM | POA: Diagnosis not present

## 2015-01-05 DIAGNOSIS — Z79899 Other long term (current) drug therapy: Secondary | ICD-10-CM | POA: Diagnosis not present

## 2015-01-05 DIAGNOSIS — S7012XS Contusion of left thigh, sequela: Secondary | ICD-10-CM

## 2015-01-05 DIAGNOSIS — B1920 Unspecified viral hepatitis C without hepatic coma: Secondary | ICD-10-CM | POA: Insufficient documentation

## 2015-01-05 DIAGNOSIS — K8689 Other specified diseases of pancreas: Secondary | ICD-10-CM

## 2015-01-05 DIAGNOSIS — R109 Unspecified abdominal pain: Secondary | ICD-10-CM | POA: Diagnosis not present

## 2015-01-05 DIAGNOSIS — K869 Disease of pancreas, unspecified: Secondary | ICD-10-CM | POA: Insufficient documentation

## 2015-01-05 DIAGNOSIS — I82890 Acute embolism and thrombosis of other specified veins: Secondary | ICD-10-CM

## 2015-01-05 DIAGNOSIS — E119 Type 2 diabetes mellitus without complications: Secondary | ICD-10-CM | POA: Insufficient documentation

## 2015-01-05 NOTE — Progress Notes (Signed)
[facility] day:  01/05/2015  Chief Complaint: [name] is a 81 y.o. female with a pancreatic mass and extensive thrombosis of the portal vein, splenic vein, and superior mesenteric vein who is seen for reassessment after interval hospitalization for a left thigh hematoma.  HPI: The patient was last seen in the medical oncology clinic on 11/22/2014.  At that time, she was seen for initial assessment.  She was on Lovenox for recently diagnosed thrombosis of the portal vein, splenic vein, and superior mesenteric vein.  Cytology from FNA of pancreatic mass on 11/18/2014 revealed scant fragments of mucinous epithelium with minimal atypia.  [id number] was 329 on 10/21/2014 and thus concerning for pancreatic cancer.  She was subsequently seen at the biliary clinic at [facility].  Repeat biopsy was recommended given the suspicion for pancreatic cancer.  The patient declined.  She was admitted to [facility] from 07/10-07/13/2016 secondary to a large left thigh hematoma.  She was seen by Dr. Lucky Cowboy who recommended discontinuation of Lovenox secondary to the major bleed.  He would not consider restarting anticoagulation for her portal vein thrombosis as her pancreatic cancer with a portal vein thrombosis has a very poor prognosis. There was no risk of embolization.  Conservative management with ice and elevation was recommended for her leg.  Pain was controlled.  She was discharged to [facility] rehabilitation facility.  Symptomatically, she is slowly improving.  She is beginning to ambulate.  She continues to have pain in her left lateral leg from below her knee to her upper thigh.  She is adamant that she does not want another biopsy of the pancreatic mass.  She would not accept any therapy for pancreatic cancer.  She does not want heroic measures and thus desires a DNR/DNI code status.  Past Medical History  Diagnosis Date  . Pancreatitis   . Hepatitis C   . Femur  fracture   . Diabetes mellitus, type 2 11/01/2014  . Benign hypertension 11/01/2014    Past Surgical History  Procedure Laterality Date  . Total hip arthroplasty Left 1981  . Abdominal hysterectomy    . Appendectomy    . Cholecystectomy    . Intracapsular cataract extraction Left 2005  . Eus N/A 11/18/2014    Procedure: ESOPHAGEAL ENDOSCOPIC ULTRASOUND (EUS) RADIAL;  Surgeon: Cora Daniels, MD;  Location: [facility] ENDOSCOPY;  Service: Endoscopy;  Laterality: N/A;  . Orif of left femur fracture  04/2007    Family History  Problem Relation Age of Onset  . Hypertension Mother     Social History:  reports that she has never smoked. She has never used smokeless tobacco. She reports that she drinks alcohol. She reports that she does not use illicit drugs.  She is from [place].  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  The patient is accompanied by her daughter, [name].  Allergies:  Allergies  Allergen Reactions  . Aspirin Other (See Comments)    Stomach problems  . Omeprazole Nausea Only    Current Medications: Current Outpatient Prescriptions  Medication Sig Dispense Refill  . acetaminophen (TYLENOL) 500 MG tablet Take 1,000 mg by mouth every 6 (six) hours as needed for moderate pain.    Marland Kitchen acetaminophen (TYLENOL) 650 MG suppository Place 1 suppository (650 mg total) rectally every 6 (six) hours as needed for mild pain (or Fever >/= 101). 12 suppository 0  . docusate sodium (COLACE) 100 MG capsule Take 1 capsule (100 mg  total) by mouth 2 (two) times daily. 10 capsule 0  . docusate sodium (COLACE) 100 MG capsule Take 1 capsule (100 mg total) by mouth 2 (two) times daily. 10 capsule 0  . lipase/protease/amylase (CREON) 12000 UNITS CPEP capsule Take 36,000 Units by mouth 3 (three) times daily with meals.    . metFORMIN (GLUCOPHAGE) 500 MG tablet Take 1 tablet by mouth daily.    . ondansetron (ZOFRAN-ODT) 8 MG disintegrating tablet Take 8 mg by mouth every 8  (eight) hours as needed for nausea.     Marland Kitchen oxyCODONE-acetaminophen (PERCOCET) 7.5-325 MG per tablet Take 1 tablet by mouth every 4 (four) hours as needed for severe pain. 30 tablet 0  . polyethylene glycol (MIRALAX / GLYCOLAX) packet Take 17 g by mouth daily. (Patient taking differently: Take 17 g by mouth daily as needed for mild constipation, moderate constipation or severe constipation. ) 14 each 0  . potassium chloride SA (K-DUR,KLOR-CON) 20 MEQ tablet Take 20 mEq by mouth daily.    . ranitidine (ZANTAC) 150 MG tablet Take 150 mg by mouth 2 (two) times daily.    Marland Kitchen alum & mag hydroxide-simeth (MAALOX/MYLANTA) 200-200-20 MG/5ML suspension Take 30 mLs by mouth every 4 (four) hours as needed for indigestion or heartburn (dyspepsia). (Patient not taking: Reported on 01/05/2015) 355 mL 0   No current facility-administered medications for this visit.    Review of Systems:  GENERAL:  Slowly improving.  No fevers or sweats. Weight loss of 7 pounds. PERFORMANCE STATUS (ECOG):  1 HEENT:  No visual changes, runny nose, sore throat, mouth sores or tenderness. Lungs: No shortness of breath or cough.  No hemoptysis. Cardiac:  No chest pain, palpitations, orthopnea, or PND. GI:  No abdominal pain.  Constipation.  No nausea, vomiting, diarrhea, melena or hematochezia. GU:  No urgency, frequency, dysuria, or hematuria. Musculoskeletal:  Thigh hematoma.  Beginning to ambulate.  No back pain.  No joint pain. Extremities:  Left thigh pain. Skin:  No rashes or skin changes. Neuro:  No headache, numbness or weakness, balance or coordination issues. Endocrine:  No diabetes, thyroid issues, hot flashes or night sweats. Psych:  No mood changes, depression or anxiety. Pain:  Pain in left lateral thigh (below the knee to upper thigh). Review of systems:  All other systems reviewed and found to be negative.   Physical Exam: Blood pressure 134/63, pulse 70, temperature 97.8 F (36.6 C), temperature source Tympanic,  resp. rate 18, height 5' 2"  (1.575 m), weight 144 lb 4.7 oz (65.45 kg).  GENERAL:  Well developed, well nourished, sitting comfortably in the exam room in no acute distress. MENTAL STATUS:  Alert and oriented to person, place and time. HEAD:  Pearline Cables short hair.  Normocephalic, atraumatic, face symmetric, no Cushingoid features. EYES:  Blue eyes.  Pupils equal round and reactive to light and accomodation.  No conjunctivitis or scleral icterus. ENT:  Oropharynx clear without lesion.  Tongue normal. Mucous membranes moist.  RESPIRATORY:  Clear to auscultation without rales, wheezes or rhonchi. CARDIOVASCULAR:  Regular rate and rhythm without murmur, rub or gallop. ABDOMEN:  Soft, non-tender, with active bowel sounds, and no hepatosplenomegaly.  No masses. SKIN:  Resolving left thigh hematoma.  No rashes, ulcers or lesions. EXTREMITIES: No skin discoloration or tenderness.  No palpable cords. LYMPH NODES: No palpable cervical, supraclavicular, axillary or inguinal adenopathy  NEUROLOGICAL: Unremarkable. PSYCH:  Appropriate.   No visits with results within 3 Day(s) from this visit. Latest known visit with results is:  Admission  on 12/26/2014, Discharged on 12/29/2014  Component Date Value Ref Range Status  . Sodium 12/26/2014 135  135 - 145 mmol/L Final  . Potassium 12/26/2014 3.9  3.5 - 5.1 mmol/L Final  . Chloride 12/26/2014 102  101 - 111 mmol/L Final  . CO2 12/26/2014 24  22 - 32 mmol/L Final  . Glucose, Bld 12/26/2014 200* 65 - 99 mg/dL Final  . BUN 12/26/2014 17  6 - 20 mg/dL Final  . Creatinine, Ser 12/26/2014 0.47  0.44 - 1.00 mg/dL Final  . Calcium 12/26/2014 8.8* 8.9 - 10.3 mg/dL Final  . Total Protein 12/26/2014 6.6  6.5 - 8.1 g/dL Final  . Albumin 12/26/2014 3.3* 3.5 - 5.0 g/dL Final  . AST 12/26/2014 45* 15 - 41 U/L Final  . ALT 12/26/2014 30  14 - 54 U/L Final  . Alkaline Phosphatase 12/26/2014 47  38 - 126 U/L Final  . Total Bilirubin 12/26/2014 0.8  0.3 - 1.2 mg/dL Final  .  GFR calc non Af Amer 12/26/2014 >60  >60 mL/min Final  . GFR calc Af Amer 12/26/2014 >60  >60 mL/min Final   Comment: (NOTE) The eGFR has been calculated using the CKD EPI equation. This calculation has not been validated in all clinical situations. eGFR's persistently <60 mL/min signify possible Chronic Kidney Disease.   . Anion gap 12/26/2014 9  5 - 15 Final  . WBC 12/26/2014 8.5  3.6 - 11.0 K/uL Final  . RBC 12/26/2014 3.17* 3.80 - 5.20 MIL/uL Final  . Hemoglobin 12/26/2014 9.8* 12.0 - 16.0 g/dL Final  . HCT 12/26/2014 30.1* 35.0 - 47.0 % Final  . MCV 12/26/2014 95.0  80.0 - 100.0 fL Final  . MCH 12/26/2014 30.8  26.0 - 34.0 pg Final  . MCHC 12/26/2014 32.4  32.0 - 36.0 g/dL Final  . RDW 12/26/2014 15.1* 11.5 - 14.5 % Final  . Platelets 12/26/2014 239  150 - 440 K/uL Final  . Neutrophils Relative % 12/26/2014 81   Final  . Neutro Abs 12/26/2014 6.9* 1.4 - 6.5 K/uL Final  . Lymphocytes Relative 12/26/2014 7   Final  . Lymphs Abs 12/26/2014 0.6* 1.0 - 3.6 K/uL Final  . Monocytes Relative 12/26/2014 11   Final  . Monocytes Absolute 12/26/2014 0.9  0.2 - 0.9 K/uL Final  . Eosinophils Relative 12/26/2014 1   Final  . Eosinophils Absolute 12/26/2014 0.0  0 - 0.7 K/uL Final  . Basophils Relative 12/26/2014 0   Final  . Basophils Absolute 12/26/2014 0.0  0 - 0.1 K/uL Final  . Magnesium 12/26/2014 1.7  1.7 - 2.4 mg/dL Final  . Glucose-Capillary 12/26/2014 174* 65 - 99 mg/dL Final  . Sodium 12/27/2014 140  135 - 145 mmol/L Final  . Potassium 12/27/2014 3.9  3.5 - 5.1 mmol/L Final  . Chloride 12/27/2014 108  101 - 111 mmol/L Final  . CO2 12/27/2014 27  22 - 32 mmol/L Final  . Glucose, Bld 12/27/2014 137* 65 - 99 mg/dL Final  . BUN 12/27/2014 14  6 - 20 mg/dL Final  . Creatinine, Ser 12/27/2014 0.37* 0.44 - 1.00 mg/dL Final  . Calcium 12/27/2014 8.2* 8.9 - 10.3 mg/dL Final  . Total Protein 12/27/2014 5.4* 6.5 - 8.1 g/dL Final  . Albumin 12/27/2014 2.9* 3.5 - 5.0 g/dL Final  . AST  12/27/2014 39  15 - 41 U/L Final  . ALT 12/27/2014 25  14 - 54 U/L Final  . Alkaline Phosphatase 12/27/2014 44  38 - 126 U/L  Final  . Total Bilirubin 12/27/2014 0.7  0.3 - 1.2 mg/dL Final  . GFR calc non Af Amer 12/27/2014 >60  >60 mL/min Final  . GFR calc Af Amer 12/27/2014 >60  >60 mL/min Final   Comment: (NOTE) The eGFR has been calculated using the CKD EPI equation. This calculation has not been validated in all clinical situations. eGFR's persistently <60 mL/min signify possible Chronic Kidney Disease.   . Anion gap 12/27/2014 5  5 - 15 Final  . WBC 12/27/2014 5.6  3.6 - 11.0 K/uL Final  . RBC 12/27/2014 2.71* 3.80 - 5.20 MIL/uL Final  . Hemoglobin 12/27/2014 8.4* 12.0 - 16.0 g/dL Final  . HCT 12/27/2014 25.8* 35.0 - 47.0 % Final  . MCV 12/27/2014 95.0  80.0 - 100.0 fL Final  . MCH 12/27/2014 31.0  26.0 - 34.0 pg Final  . MCHC 12/27/2014 32.6  32.0 - 36.0 g/dL Final  . RDW 12/27/2014 14.8* 11.5 - 14.5 % Final  . Platelets 12/27/2014 186  150 - 440 K/uL Final  . Prothrombin Time 12/27/2014 14.2  11.4 - 15.0 seconds Final  . INR 12/27/2014 1.08   Final  . aPTT 12/27/2014 34  24 - 36 seconds Final  . Glucose-Capillary 12/26/2014 151* 65 - 99 mg/dL Final  . Comment 1 12/26/2014 Notify RN   Final  . Glucose-Capillary 12/27/2014 130* 65 - 99 mg/dL Final  . Glucose-Capillary 12/27/2014 149* 65 - 99 mg/dL Final  . Glucose-Capillary 12/27/2014 140* 65 - 99 mg/dL Final  . WBC 12/28/2014 4.9  3.6 - 11.0 K/uL Final  . RBC 12/28/2014 2.61* 3.80 - 5.20 MIL/uL Final  . Hemoglobin 12/28/2014 8.3* 12.0 - 16.0 g/dL Final  . HCT 12/28/2014 24.9* 35.0 - 47.0 % Final  . MCV 12/28/2014 95.3  80.0 - 100.0 fL Final  . MCH 12/28/2014 31.8  26.0 - 34.0 pg Final  . MCHC 12/28/2014 33.4  32.0 - 36.0 g/dL Final  . RDW 12/28/2014 14.8* 11.5 - 14.5 % Final  . Platelets 12/28/2014 181  150 - 440 K/uL Final  . Neutrophils Relative % 12/28/2014 71   Final  . Neutro Abs 12/28/2014 3.5  1.4 - 6.5 K/uL Final   . Lymphocytes Relative 12/28/2014 11   Final  . Lymphs Abs 12/28/2014 0.5* 1.0 - 3.6 K/uL Final  . Monocytes Relative 12/28/2014 14   Final  . Monocytes Absolute 12/28/2014 0.7  0.2 - 0.9 K/uL Final  . Eosinophils Relative 12/28/2014 3   Final  . Eosinophils Absolute 12/28/2014 0.2  0 - 0.7 K/uL Final  . Basophils Relative 12/28/2014 1   Final  . Basophils Absolute 12/28/2014 0.0  0 - 0.1 K/uL Final  . Sodium 12/28/2014 138  135 - 145 mmol/L Final  . Potassium 12/28/2014 3.6  3.5 - 5.1 mmol/L Final  . Chloride 12/28/2014 104  101 - 111 mmol/L Final  . CO2 12/28/2014 26  22 - 32 mmol/L Final  . Glucose, Bld 12/28/2014 130* 65 - 99 mg/dL Final  . BUN 12/28/2014 10  6 - 20 mg/dL Final  . Creatinine, Ser 12/28/2014 0.38* 0.44 - 1.00 mg/dL Final  . Calcium 12/28/2014 8.0* 8.9 - 10.3 mg/dL Final  . Total Protein 12/28/2014 5.4* 6.5 - 8.1 g/dL Final  . Albumin 12/28/2014 2.5* 3.5 - 5.0 g/dL Final  . AST 12/28/2014 40  15 - 41 U/L Final  . ALT 12/28/2014 24  14 - 54 U/L Final  . Alkaline Phosphatase 12/28/2014 43  38 - 126  U/L Final  . Total Bilirubin 12/28/2014 1.2  0.3 - 1.2 mg/dL Final  . GFR calc non Af Amer 12/28/2014 >60  >60 mL/min Final  . GFR calc Af Amer 12/28/2014 >60  >60 mL/min Final   Comment: (NOTE) The eGFR has been calculated using the CKD EPI equation. This calculation has not been validated in all clinical situations. eGFR's persistently <60 mL/min signify possible Chronic Kidney Disease.   . Anion gap 12/28/2014 8  5 - 15 Final  . Glucose-Capillary 12/27/2014 138* 65 - 99 mg/dL Final  . Comment 1 12/27/2014 Notify RN   Final  . Glucose-Capillary 12/28/2014 135* 65 - 99 mg/dL Final  . Glucose-Capillary 12/28/2014 237* 65 - 99 mg/dL Final  . Glucose-Capillary 12/28/2014 190* 65 - 99 mg/dL Final  . WBC 12/29/2014 4.3  3.6 - 11.0 K/uL Final  . RBC 12/29/2014 2.61* 3.80 - 5.20 MIL/uL Final  . Hemoglobin 12/29/2014 8.2* 12.0 - 16.0 g/dL Final  . HCT 12/29/2014 25.0*  35.0 - 47.0 % Final  . MCV 12/29/2014 95.8  80.0 - 100.0 fL Final  . MCH 12/29/2014 31.3  26.0 - 34.0 pg Final  . MCHC 12/29/2014 32.6  32.0 - 36.0 g/dL Final  . RDW 12/29/2014 15.3* 11.5 - 14.5 % Final  . Platelets 12/29/2014 201  150 - 440 K/uL Final  . Neutrophils Relative % 12/29/2014 68   Final  . Neutro Abs 12/29/2014 3.0  1.4 - 6.5 K/uL Final  . Lymphocytes Relative 12/29/2014 12   Final  . Lymphs Abs 12/29/2014 0.5* 1.0 - 3.6 K/uL Final  . Monocytes Relative 12/29/2014 14   Final  . Monocytes Absolute 12/29/2014 0.6  0.2 - 0.9 K/uL Final  . Eosinophils Relative 12/29/2014 5   Final  . Eosinophils Absolute 12/29/2014 0.2  0 - 0.7 K/uL Final  . Basophils Relative 12/29/2014 1   Final  . Basophils Absolute 12/29/2014 0.0  0 - 0.1 K/uL Final  . Sodium 12/29/2014 140  135 - 145 mmol/L Final  . Potassium 12/29/2014 3.9  3.5 - 5.1 mmol/L Final  . Chloride 12/29/2014 106  101 - 111 mmol/L Final  . CO2 12/29/2014 27  22 - 32 mmol/L Final  . Glucose, Bld 12/29/2014 174* 65 - 99 mg/dL Final  . BUN 12/29/2014 10  6 - 20 mg/dL Final  . Creatinine, Ser 12/29/2014 0.36* 0.44 - 1.00 mg/dL Final  . Calcium 12/29/2014 8.1* 8.9 - 10.3 mg/dL Final  . GFR calc non Af Amer 12/29/2014 >60  >60 mL/min Final  . GFR calc Af Amer 12/29/2014 >60  >60 mL/min Final   Comment: (NOTE) The eGFR has been calculated using the CKD EPI equation. This calculation has not been validated in all clinical situations. eGFR's persistently <60 mL/min signify possible Chronic Kidney Disease.   . Anion gap 12/29/2014 7  5 - 15 Final  . Glucose-Capillary 12/28/2014 196* 65 - 99 mg/dL Final  . Glucose-Capillary 12/29/2014 155* 65 - 99 mg/dL Final  . Glucose-Capillary 12/29/2014 205* 65 - 99 mg/dL Final    Assessment:  [name] is an 81 y.o. female with a pancreatic mass and extensive thrombosis of the portal vein, splenic and mesenteric vein presenting with abdominal pain.    Abdominal MRI on 10/21/2014  revealed thrombosis in the right and left portal vein, main portal vein, splenic vein and superior mesenteric vein. There was a 2.8 x 2.3 cm infiltrative pancreatic head mass causing stricture of the distal common bile duct. There was  edema at the root of the mesentery.  CA 19-9 was 329 on 10/21/2014.  Endoscopic ultrasound and biopsy on 11/18/2014 revealed a long segment of Barrett's esophagus (15 cm).  Biopsies were taken. There was an irregular hypoechoic mass in genu of the pancreas measuring 3.7 x 2.1 cm.  Margins were irregular suggesting invasion into the portal vein. Cytology from FNA of pancreatic mass revealed scant fragments of mucinous epithelium with minimal atypia.  Endoscopic staging staging suggested a T2N0 lesion.  She was seen at the biliary clinic at [facility].  She declined biopsy.    She was admitted to [facility] from 07/10-07/13/2016 secondary to a large left thigh hematoma.  Lovenox was discontinued secondary to the major bleed.  She was discharged to [facility] rehabilitation facility.  Symptomatically, she is slowly improving.  She is beginning to ambulate.  She has pain in her left lateral leg from below her knee to her upper thigh.  She is adamant that she does not want another biopsy of the pancreatic mass.  She would not accept any therapy for pancreatic cancer.  She does not want heroic measures and notes a DNR/DNI code status.  Plan: 1.  Discuss interim events, thigh hematoma and decision for no anticoagulation. 2.  Discuss patient's decision for no further biopsy of pancreatic mass and no treatment if she had pancreatic cancer. 3.  Discuss code status issues. 4.  RTC in 1 month for MD assessment and labs (CBC with diff, CMP, CA19-9).   Lequita Asal, MD  01/05/2015, 5:57 PM

## 2015-02-09 ENCOUNTER — Inpatient Hospital Stay: Payer: PPO | Attending: Hematology and Oncology

## 2015-02-09 ENCOUNTER — Inpatient Hospital Stay (HOSPITAL_BASED_OUTPATIENT_CLINIC_OR_DEPARTMENT_OTHER): Payer: PPO | Admitting: Hematology and Oncology

## 2015-02-09 ENCOUNTER — Other Ambulatory Visit: Payer: Self-pay

## 2015-02-09 VITALS — BP 153/74 | HR 73 | Temp 97.6°F | Resp 20 | Wt 137.1 lb

## 2015-02-09 DIAGNOSIS — K869 Disease of pancreas, unspecified: Secondary | ICD-10-CM | POA: Insufficient documentation

## 2015-02-09 DIAGNOSIS — Z79899 Other long term (current) drug therapy: Secondary | ICD-10-CM | POA: Diagnosis not present

## 2015-02-09 DIAGNOSIS — I81 Portal vein thrombosis: Secondary | ICD-10-CM | POA: Insufficient documentation

## 2015-02-09 DIAGNOSIS — K8689 Other specified diseases of pancreas: Secondary | ICD-10-CM

## 2015-02-09 LAB — CBC WITH DIFFERENTIAL/PLATELET
Basophils Absolute: 0 10*3/uL (ref 0–0.1)
Basophils Relative: 1 %
Eosinophils Absolute: 0.1 10*3/uL (ref 0–0.7)
Eosinophils Relative: 2 %
HCT: 38.6 % (ref 35.0–47.0)
Hemoglobin: 12.6 g/dL (ref 12.0–16.0)
Lymphocytes Relative: 10 %
Lymphs Abs: 0.5 10*3/uL — ABNORMAL LOW (ref 1.0–3.6)
MCH: 30.5 pg (ref 26.0–34.0)
MCHC: 32.5 g/dL (ref 32.0–36.0)
MCV: 93.7 fL (ref 80.0–100.0)
Monocytes Absolute: 0.5 10*3/uL (ref 0.2–0.9)
Monocytes Relative: 9 %
Neutro Abs: 3.9 10*3/uL (ref 1.4–6.5)
Neutrophils Relative %: 78 %
Platelets: 183 10*3/uL (ref 150–440)
RBC: 4.12 MIL/uL (ref 3.80–5.20)
RDW: 15 % — ABNORMAL HIGH (ref 11.5–14.5)
WBC: 5 10*3/uL (ref 3.6–11.0)

## 2015-02-09 LAB — COMPREHENSIVE METABOLIC PANEL
ALT: 47 U/L (ref 14–54)
AST: 50 U/L — ABNORMAL HIGH (ref 15–41)
Albumin: 3.7 g/dL (ref 3.5–5.0)
Alkaline Phosphatase: 64 U/L (ref 38–126)
Anion gap: 9 (ref 5–15)
BUN: 16 mg/dL (ref 6–20)
CO2: 25 mmol/L (ref 22–32)
Calcium: 9.5 mg/dL (ref 8.9–10.3)
Chloride: 107 mmol/L (ref 101–111)
Creatinine, Ser: 0.49 mg/dL (ref 0.44–1.00)
GFR calc Af Amer: >60 mL/min (ref >60)
GFR calc non Af Amer: >60 mL/min (ref >60)
Glucose, Bld: 251 mg/dL — ABNORMAL HIGH (ref 65–99)
Potassium: 4.5 mmol/L (ref 3.5–5.1)
Sodium: 141 mmol/L (ref 135–145)
Total Bilirubin: 0.6 mg/dL (ref 0.3–1.2)
Total Protein: 7.3 g/dL (ref 6.5–8.1)

## 2015-02-09 NOTE — Progress Notes (Signed)
[facility] day:  02/09/2015  Chief Complaint: [name] is a 81 y.o. female with a pancreatic mass, extensive thrombosis of the portal vein, splenic vein, and superior mesenteric vein with subsequent large left thigh bleed on anti-coagulation who is seen for 1 month reassessment.  HPI: The patient was last seen in the medical oncology clinic on 01/05/2015.  At that time, she was seen for assessment after interval hospitalization for a left thigh hematoma. Lovenox was discontinued  secondary to the major bleed.  Vascular surgery did not recommend restating anticoagulation for her portal vein thrombosis as her pancreatic cancer had a very poor prognosis. There was no risk of embolization.  Conservative management was recommended.  Pain was controlled.  She was staying at [facility] rehabilitation facility.  During the interim, he states that she feels great. Her weight is stable.  She denies any pain. She notes no problems. She is getting physical therapy. She can perform her activities of daily living "great".  She saw Dr. Ginette Pitman on 01/27/2015 who increased her Metformin to twice daily.  She was put on "another pill".  She states that her tumor count went up from 300 to 588.  Past Medical History  Diagnosis Date  . Pancreatitis   . Hepatitis C   . Femur fracture ([place])   . Diabetes mellitus, type 2 ([place]) 11/01/2014    Past Surgical History  Procedure Laterality Date  . Total hip arthroplasty Left 1981  . Abdominal hysterectomy    . Appendectomy    . Cholecystectomy    . Intracapsular cataract extraction Left 2005  . Eus N/A 11/18/2014    Procedure: ESOPHAGEAL ENDOSCOPIC ULTRASOUND (EUS) RADIAL;  Surgeon: Cora Daniels, MD;  Location: [facility] ENDOSCOPY;  Service: Endoscopy;  Laterality: N/A;  . Orif of left femur fracture  04/2007    Family History  Problem Relation Age of Onset  . Hypertension Mother     Social History:   reports that she has never smoked. She has never used smokeless tobacco. She reports that she drinks alcohol. She reports that she does not use illicit drugs.  She is from [place].  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  She was discharged from [facility].  The patient is accompanied by her daughter, [name].  Allergies:  Allergies  Allergen Reactions  . Aspirin Other (See Comments)    Stomach problems  . Omeprazole Nausea Only    Current Medications: Current Outpatient Prescriptions  Medication Sig Dispense Refill  . acetaminophen (TYLENOL) 500 MG tablet Take 1,000 mg by mouth every 6 (six) hours as needed for moderate pain.    Marland Kitchen acetaminophen (TYLENOL) 650 MG suppository Place 1 suppository (650 mg total) rectally every 6 (six) hours as needed for mild pain (or Fever >/= 101). 12 suppository 0  . alum & mag hydroxide-simeth (MAALOX/MYLANTA) 200-200-20 MG/5ML suspension Take 30 mLs by mouth every 4 (four) hours as needed for indigestion or heartburn (dyspepsia). (Patient not taking: Reported on 01/05/2015) 355 mL 0  . docusate sodium (COLACE) 100 MG capsule Take 1 capsule (100 mg total) by mouth 2 (two) times daily. 10 capsule 0  . docusate sodium (COLACE) 100 MG capsule Take 1 capsule (100 mg total) by mouth 2 (two) times daily. 10 capsule 0  . lipase/protease/amylase (CREON) 12000 UNITS CPEP capsule Take 36,000 Units by mouth 3 (three) times daily with meals.    . metFORMIN (GLUCOPHAGE) 500 MG tablet Take  1 tablet by mouth daily.    . ondansetron (ZOFRAN-ODT) 8 MG disintegrating tablet Take 8 mg by mouth every 8 (eight) hours as needed for nausea.     Marland Kitchen oxyCODONE-acetaminophen (PERCOCET) 7.5-325 MG per tablet Take 1 tablet by mouth every 4 (four) hours as needed for severe pain. 30 tablet 0  . polyethylene glycol (MIRALAX / GLYCOLAX) packet Take 17 g by mouth daily. (Patient taking differently: Take 17 g by mouth daily as needed for mild constipation, moderate  constipation or severe constipation. ) 14 each 0  . potassium chloride SA (K-DUR,KLOR-CON) 20 MEQ tablet Take 20 mEq by mouth daily.    . ranitidine (ZANTAC) 150 MG tablet Take 150 mg by mouth 2 (two) times daily.     No current facility-administered medications for this visit.    Review of Systems:  GENERAL:  Feels great.  No fevers or sweats. Ongoing weight loss (7 pounds). PERFORMANCE STATUS (ECOG):  1 HEENT:  No visual changes, runny nose, sore throat, mouth sores or tenderness. Lungs: No shortness of breath or cough.  No hemoptysis. Cardiac:  No chest pain, palpitations, orthopnea, or PND. GI:  Appetite 50% normal.  No abdominal pain.  No nausea, vomiting, diarrhea, constipation, melena or hematochezia. GU:  No urgency, frequency, dysuria, or hematuria. Musculoskeletal:  No back pain.  No joint pain.  No muscle tenderness. Extremities:  No pain or swelling. Skin:  No rashes or skin changes. Neuro:  No headache, numbness or weakness, balance or coordination issues. Endocrine:  Diabetes.  No thyroid issues, hot flashes or night sweats. Psych:  No mood changes, depression or anxiety. Pain:  No focal pain. Review of systems:  All other systems reviewed and found to be negative.   Physical Exam: Blood pressure 153/74, pulse 73, temperature 97.6 F (36.4 C), temperature source Oral, resp. rate 20, weight 137 lb 2 oz (62.2 kg), SpO2 99 %.  GENERAL:  Well developed, well nourished, sitting comfortably in the exam room in no acute distress. MENTAL STATUS:  Alert and oriented to person, place and time. HEAD:  Short gray hair.  Normocephalic, atraumatic, face symmetric, no Cushingoid features. EYES:  Blue eyes.  Pupils equal round and reactive to light and accomodation.  No conjunctivitis or scleral icterus. ENT:  Oropharynx clear without lesion.  Tongue normal. Mucous membranes moist.  RESPIRATORY:  Clear to auscultation without rales, wheezes or rhonchi. CARDIOVASCULAR:  Regular rate and  rhythm without murmur, rub or gallop. ABDOMEN:  Soft, non-tender, with active bowel sounds, and no hepatosplenomegaly.  No masses. SKIN:  No rashes, ulcers or lesions. EXTREMITIES: No skin discoloration or tenderness.  No palpable cords. LYMPH NODES: No palpable cervical, supraclavicular, axillary or inguinal adenopathy  NEUROLOGICAL: Unremarkable. PSYCH:  Appropriate.   Appointment on 02/09/2015  Component Date Value Ref Range Status  . WBC 02/09/2015 5.0  3.6 - 11.0 K/uL Final  . RBC 02/09/2015 4.12  3.80 - 5.20 MIL/uL Final  . Hemoglobin 02/09/2015 12.6  12.0 - 16.0 g/dL Final  . HCT 02/09/2015 38.6  35.0 - 47.0 % Final  . MCV 02/09/2015 93.7  80.0 - 100.0 fL Final  . MCH 02/09/2015 30.5  26.0 - 34.0 pg Final  . MCHC 02/09/2015 32.5  32.0 - 36.0 g/dL Final  . RDW 02/09/2015 15.0* 11.5 - 14.5 % Final  . Platelets 02/09/2015 183  150 - 440 K/uL Final  . Neutrophils Relative % 02/09/2015 78   Final  . Neutro Abs 02/09/2015 3.9  1.4 - 6.5  K/uL Final  . Lymphocytes Relative 02/09/2015 10   Final  . Lymphs Abs 02/09/2015 0.5* 1.0 - 3.6 K/uL Final  . Monocytes Relative 02/09/2015 9   Final  . Monocytes Absolute 02/09/2015 0.5  0.2 - 0.9 K/uL Final  . Eosinophils Relative 02/09/2015 2   Final  . Eosinophils Absolute 02/09/2015 0.1  0 - 0.7 K/uL Final  . Basophils Relative 02/09/2015 1   Final  . Basophils Absolute 02/09/2015 0.0  0 - 0.1 K/uL Final  . Sodium 02/09/2015 141  135 - 145 mmol/L Final  . Potassium 02/09/2015 4.5  3.5 - 5.1 mmol/L Final  . Chloride 02/09/2015 107  101 - 111 mmol/L Final  . CO2 02/09/2015 25  22 - 32 mmol/L Final  . Glucose, Bld 02/09/2015 251* 65 - 99 mg/dL Final  . BUN 02/09/2015 16  6 - 20 mg/dL Final  . Creatinine, Ser 02/09/2015 0.49  0.44 - 1.00 mg/dL Final  . Calcium 02/09/2015 9.5  8.9 - 10.3 mg/dL Final  . Total Protein 02/09/2015 7.3  6.5 - 8.1 g/dL Final  . Albumin 02/09/2015 3.7  3.5 - 5.0 g/dL Final  . AST 02/09/2015 50* 15 - 41 U/L Final  .  ALT 02/09/2015 47  14 - 54 U/L Final  . Alkaline Phosphatase 02/09/2015 64  38 - 126 U/L Final  . Total Bilirubin 02/09/2015 0.6  0.3 - 1.2 mg/dL Final  . GFR calc non Af Amer 02/09/2015 >60  >60 mL/min Final  . GFR calc Af Amer 02/09/2015 >60  >60 mL/min Final   Comment: (NOTE) The eGFR has been calculated using the CKD EPI equation. This calculation has not been validated in all clinical situations. eGFR's persistently <60 mL/min signify possible Chronic Kidney Disease.   . Anion gap 02/09/2015 9  5 - 15 Final  . CA 19-9 02/09/2015 565* 0 - 35 U/mL Final   Comment: (NOTE) Roche ECLIA methodology Performed At: [facility] [address] [place], [place] [id number] Lindon Romp MD UU:[id number]     Assessment:  [name] is an 81 y.o. female with a pancreatic mass and extensive thrombosis of the portal vein, splenic and mesenteric vein presenting with abdominal pain.    Abdominal MRI on 10/21/2014 revealed thrombosis in the right and left portal vein, main portal vein, splenic vein and superior mesenteric vein. There was a 2.8 x 2.3 cm infiltrative pancreatic head mass causing stricture of the distal common bile duct. There was edema at the root of the mesentery.  CA 19-9 was 329 on 10/21/2014 and 565 on 02/09/2015.  Endoscopic ultrasound and biopsy on 11/18/2014 revealed a long segment of Barrett's esophagus (15 cm).  Biopsies were taken. There was an irregular hypoechoic mass in genu of the pancreas measuring 3.7 x 2.1 cm.  Margins were irregular suggesting invasion into the portal vein. Cytology from FNA of pancreatic mass revealed scant fragments of mucinous epithelium with minimal atypia.  Endoscopic staging staging suggested a T2N0 lesion.  She was seen at the biliary clinic at [facility]  She declined biopsy.  She was admitted to [facility] from 07/10-07/13/2016 secondary to a large left thigh hematoma. Lovenox was discontinued secondary to the major bleed without plans  for restarting anticoagulation. She states that she would not accept treatment for pancreatic cancer.  Code stats is DNR/DNI.  Symptomatically, she is doing well. She denies any complaints.  Plan: 1.  Labs today: CBC with diff, CMP, CA19-9. 2.  Discuss patient's thoughts about follow-up and/or  intervention. 3.  RTC in 3 months for MD assessment and labs (CBC with diff, CMP, CA19-9).   Lequita Asal, MD  02/09/2015, 11:24 AM

## 2015-02-10 LAB — CANCER ANTIGEN 19-9: CA 19-9: 565 U/mL — ABNORMAL HIGH (ref 0–35)

## 2015-03-15 LAB — HEPATITIS C VRS RNA DETECT BY PCR-QUAL

## 2015-05-04 ENCOUNTER — Other Ambulatory Visit: Payer: Commercial Managed Care - HMO

## 2015-05-04 ENCOUNTER — Ambulatory Visit: Payer: Commercial Managed Care - HMO | Admitting: Hematology and Oncology

## 2015-06-02 ENCOUNTER — Other Ambulatory Visit: Payer: Self-pay | Admitting: Internal Medicine

## 2015-06-02 DIAGNOSIS — Q453 Other congenital malformations of pancreas and pancreatic duct: Secondary | ICD-10-CM

## 2015-06-27 ENCOUNTER — Ambulatory Visit
Admission: RE | Admit: 2015-06-27 | Discharge: 2015-06-27 | Disposition: A | Discharge status: Home / Self Care | Payer: PPO | Source: Ambulatory Visit | Attending: Internal Medicine | Admitting: Internal Medicine

## 2015-06-27 DIAGNOSIS — Q453 Other congenital malformations of pancreas and pancreatic duct: Secondary | ICD-10-CM

## 2015-06-28 ENCOUNTER — Ambulatory Visit: Payer: Commercial Managed Care - HMO

## 2015-07-04 ENCOUNTER — Ambulatory Visit
Admission: RE | Admit: 2015-07-04 | Discharge: 2015-07-04 | Disposition: A | Discharge status: Home / Self Care | Payer: PPO | Source: Ambulatory Visit | Attending: Internal Medicine | Admitting: Internal Medicine

## 2015-07-04 DIAGNOSIS — R911 Solitary pulmonary nodule: Secondary | ICD-10-CM | POA: Insufficient documentation

## 2015-07-04 DIAGNOSIS — Q453 Other congenital malformations of pancreas and pancreatic duct: Secondary | ICD-10-CM | POA: Diagnosis not present

## 2015-07-04 DIAGNOSIS — I81 Portal vein thrombosis: Secondary | ICD-10-CM | POA: Diagnosis not present

## 2015-07-04 MED ORDER — IOHEXOL 350 MG/ML SOLN
100.0000 mL | Freq: Once | INTRAVENOUS | Status: AC | PRN
  Administered 2015-07-04: 85 mL via INTRAVENOUS

## 2015-07-20 ENCOUNTER — Telehealth: Payer: Self-pay | Admitting: Hematology and Oncology

## 2015-07-20 NOTE — Telephone Encounter (Signed)
Message forwarded to Dr. Grayland Ormond and Dr. Mike Gip.

## 2015-07-20 NOTE — Telephone Encounter (Signed)
Patient would like to change to [facility] because they did not feel that Dr. Mike Gip was a good match for them. She was due for an appt. in November but cancelled. Patient refuses to see Mike Gip again. Please advise.

## 2015-07-21 NOTE — Telephone Encounter (Signed)
Forwarded message to Laura

## 2015-07-21 NOTE — Telephone Encounter (Signed)
Ok with me. Thanks.  

## 2015-07-29 ENCOUNTER — Encounter: Payer: Self-pay | Admitting: Hematology and Oncology

## 2015-07-29 ENCOUNTER — Ambulatory Visit: Payer: PPO | Admitting: Oncology

## 2015-07-29 ENCOUNTER — Other Ambulatory Visit: Payer: PPO

## 2015-08-04 ENCOUNTER — Other Ambulatory Visit: Payer: Self-pay | Admitting: *Deleted

## 2015-08-04 DIAGNOSIS — K8689 Other specified diseases of pancreas: Secondary | ICD-10-CM

## 2015-08-05 ENCOUNTER — Ambulatory Visit: Payer: PPO | Admitting: Oncology

## 2015-08-05 ENCOUNTER — Other Ambulatory Visit: Payer: PPO

## 2015-08-12 ENCOUNTER — Ambulatory Visit (HOSPITAL_BASED_OUTPATIENT_CLINIC_OR_DEPARTMENT_OTHER): Payer: PPO | Admitting: Oncology

## 2015-08-12 ENCOUNTER — Inpatient Hospital Stay: Payer: PPO | Attending: Oncology

## 2015-08-12 VITALS — BP 147/77 | HR 77 | Temp 98.0°F | Resp 20 | Wt 126.5 lb

## 2015-08-12 DIAGNOSIS — Z79899 Other long term (current) drug therapy: Secondary | ICD-10-CM | POA: Insufficient documentation

## 2015-08-12 DIAGNOSIS — I81 Portal vein thrombosis: Secondary | ICD-10-CM | POA: Diagnosis not present

## 2015-08-12 DIAGNOSIS — K869 Disease of pancreas, unspecified: Secondary | ICD-10-CM

## 2015-08-12 DIAGNOSIS — K8689 Other specified diseases of pancreas: Secondary | ICD-10-CM

## 2015-08-12 LAB — COMPREHENSIVE METABOLIC PANEL
ALT: 230 U/L — AB (ref 14–54)
AST: 362 U/L — AB (ref 15–41)
Albumin: 2.9 g/dL — ABNORMAL LOW (ref 3.5–5.0)
Alkaline Phosphatase: 783 U/L — ABNORMAL HIGH (ref 38–126)
Anion gap: 8 (ref 5–15)
BILIRUBIN TOTAL: 29.7 mg/dL — AB (ref 0.3–1.2)
BUN: 15 mg/dL (ref 6–20)
CO2: 23 mmol/L (ref 22–32)
CREATININE: UNDETERMINED mg/dL (ref 0.44–1.00)
Calcium: 9 mg/dL (ref 8.9–10.3)
Chloride: 102 mmol/L (ref 101–111)
Glucose, Bld: 333 mg/dL — ABNORMAL HIGH (ref 65–99)
Potassium: 4.2 mmol/L (ref 3.5–5.1)
Sodium: 133 mmol/L — ABNORMAL LOW (ref 135–145)
TOTAL PROTEIN: 7.5 g/dL (ref 6.5–8.1)

## 2015-08-12 LAB — CBC WITH DIFFERENTIAL/PLATELET
BASOS ABS: 0 10*3/uL (ref 0–0.1)
Basophils Relative: 0 %
Eosinophils Absolute: 0 10*3/uL (ref 0–0.7)
Eosinophils Relative: 0 %
HEMATOCRIT: 36 % (ref 35.0–47.0)
Hemoglobin: 11.9 g/dL — ABNORMAL LOW (ref 12.0–16.0)
LYMPHS ABS: 0.5 10*3/uL — AB (ref 1.0–3.6)
LYMPHS PCT: 6 %
MCH: 33.3 pg (ref 26.0–34.0)
MCHC: 32.9 g/dL (ref 32.0–36.0)
MCV: 101.1 fL — AB (ref 80.0–100.0)
MYELOCYTES: 1 %
Monocytes Absolute: 0.6 10*3/uL (ref 0.2–0.9)
Monocytes Relative: 7 %
NEUTROS PCT: 86 %
Neutro Abs: 7.1 10*3/uL — ABNORMAL HIGH (ref 1.4–6.5)
Platelets: 214 10*3/uL (ref 150–440)
RBC: 3.56 MIL/uL — AB (ref 3.80–5.20)
RDW: 18 % — AB (ref 11.5–14.5)
WBC: 8.2 10*3/uL (ref 3.6–11.0)

## 2015-08-12 NOTE — Progress Notes (Signed)
Patient here today for follow up regarding pancreatic mass. Patient denies any concerns today, family verbalizes concerns with patient being jaundiced.

## 2015-08-13 LAB — CANCER ANTIGEN 19-9: CA 19-9: 2703 U/mL — ABNORMAL HIGH (ref 0–35)

## 2015-08-15 ENCOUNTER — Other Ambulatory Visit: Payer: Self-pay

## 2015-08-15 ENCOUNTER — Telehealth: Payer: Self-pay

## 2015-08-15 NOTE — Telephone Encounter (Signed)
Pt has been scheduled for ERCP at [facility] on Friday, March 3rd. Spoke with pt's daughter, [name] and discussed with her the reason we need to get this procedure done asap. Also, discussed with her instructions and preparation for procedure.

## 2015-08-18 ENCOUNTER — Encounter: Payer: Self-pay | Admitting: *Deleted

## 2015-08-19 ENCOUNTER — Ambulatory Visit: Payer: PPO | Admitting: Anesthesiology

## 2015-08-19 ENCOUNTER — Encounter
Admission: RE | Disposition: A | Discharge status: Home / Self Care | Payer: Self-pay | Source: Ambulatory Visit | Attending: Gastroenterology

## 2015-08-19 ENCOUNTER — Ambulatory Visit
Admission: RE | Admit: 2015-08-19 | Discharge: 2015-08-19 | Disposition: A | Discharge status: Home / Self Care | Payer: PPO | Source: Ambulatory Visit | Attending: Gastroenterology | Admitting: Gastroenterology

## 2015-08-19 ENCOUNTER — Encounter: Payer: Self-pay | Admitting: *Deleted

## 2015-08-19 ENCOUNTER — Telehealth: Payer: Self-pay | Admitting: Gastroenterology

## 2015-08-19 DIAGNOSIS — Z9842 Cataract extraction status, left eye: Secondary | ICD-10-CM | POA: Insufficient documentation

## 2015-08-19 DIAGNOSIS — Z8249 Family history of ischemic heart disease and other diseases of the circulatory system: Secondary | ICD-10-CM | POA: Insufficient documentation

## 2015-08-19 DIAGNOSIS — K859 Acute pancreatitis without necrosis or infection, unspecified: Secondary | ICD-10-CM | POA: Diagnosis not present

## 2015-08-19 DIAGNOSIS — Z90710 Acquired absence of both cervix and uterus: Secondary | ICD-10-CM | POA: Insufficient documentation

## 2015-08-19 DIAGNOSIS — B1920 Unspecified viral hepatitis C without hepatic coma: Secondary | ICD-10-CM | POA: Insufficient documentation

## 2015-08-19 DIAGNOSIS — K831 Obstruction of bile duct: Secondary | ICD-10-CM | POA: Diagnosis not present

## 2015-08-19 DIAGNOSIS — Z7984 Long term (current) use of oral hypoglycemic drugs: Secondary | ICD-10-CM | POA: Diagnosis not present

## 2015-08-19 DIAGNOSIS — E119 Type 2 diabetes mellitus without complications: Secondary | ICD-10-CM | POA: Insufficient documentation

## 2015-08-19 DIAGNOSIS — Z9889 Other specified postprocedural states: Secondary | ICD-10-CM | POA: Diagnosis not present

## 2015-08-19 DIAGNOSIS — R17 Unspecified jaundice: Secondary | ICD-10-CM

## 2015-08-19 DIAGNOSIS — Z888 Allergy status to other drugs, medicaments and biological substances status: Secondary | ICD-10-CM | POA: Insufficient documentation

## 2015-08-19 DIAGNOSIS — Z886 Allergy status to analgesic agent status: Secondary | ICD-10-CM | POA: Insufficient documentation

## 2015-08-19 DIAGNOSIS — D490 Neoplasm of unspecified behavior of digestive system: Secondary | ICD-10-CM

## 2015-08-19 DIAGNOSIS — Z9049 Acquired absence of other specified parts of digestive tract: Secondary | ICD-10-CM | POA: Diagnosis not present

## 2015-08-19 HISTORY — PX: ERCP: SHX5425

## 2015-08-19 LAB — GLUCOSE, CAPILLARY
GLUCOSE-CAPILLARY: 267 mg/dL — AB (ref 65–99)
GLUCOSE-CAPILLARY: 171 mg/dL — AB (ref 65–99)

## 2015-08-19 SURGERY — ERCP, WITH INTERVENTION IF INDICATED
Anesthesia: General

## 2015-08-19 MED ORDER — MIDAZOLAM HCL 5 MG/5ML IJ SOLN
INTRAMUSCULAR | Status: DC | PRN
  Administered 2015-08-19: 1 mg via INTRAVENOUS

## 2015-08-19 MED ORDER — LIDOCAINE HCL (CARDIAC) 20 MG/ML IV SOLN
INTRAVENOUS | Status: DC | PRN
  Administered 2015-08-19: 100 mg via INTRAVENOUS

## 2015-08-19 MED ORDER — INSULIN ASPART 100 UNIT/ML ~~LOC~~ SOLN
5.0000 [IU] | Freq: Once | SUBCUTANEOUS | Status: AC
  Administered 2015-08-19: 5 [IU] via SUBCUTANEOUS
  Filled 2015-08-19: qty 0.05

## 2015-08-19 MED ORDER — PROPOFOL 10 MG/ML IV BOLUS
INTRAVENOUS | Status: DC | PRN
  Administered 2015-08-19: 20 mg via INTRAVENOUS

## 2015-08-19 MED ORDER — LIDOCAINE HCL (PF) 1 % IJ SOLN
INTRAMUSCULAR | Status: AC
  Administered 2015-08-19: 2 mL
  Filled 2015-08-19: qty 2

## 2015-08-19 MED ORDER — PROPOFOL 500 MG/50ML IV EMUL
INTRAVENOUS | Status: DC | PRN
  Administered 2015-08-19: 80 ug/kg/min via INTRAVENOUS

## 2015-08-19 MED ORDER — SODIUM CHLORIDE 0.9 % IV SOLN
INTRAVENOUS | Status: DC
  Administered 2015-08-19: 14:00:00 via INTRAVENOUS

## 2015-08-19 MED ORDER — INSULIN REGULAR HUMAN 100 UNIT/ML IJ SOLN: 5.0000 [IU] | Freq: Once | INTRAMUSCULAR | Status: DC

## 2015-08-19 MED ORDER — FENTANYL CITRATE (PF) 100 MCG/2ML IJ SOLN
INTRAMUSCULAR | Status: DC | PRN
  Administered 2015-08-19: 50 ug via INTRAVENOUS

## 2015-08-19 NOTE — Telephone Encounter (Signed)
Spoke with pt's daughter, [name]. She wanted to let me know the pt was having diarrhea this morning and she is worried she will not be able to make her appt today for the ERCP. I advised her this is not an option. She must go to have this procedure done. She expressed understanding and will make sure she goes.

## 2015-08-19 NOTE — Transfer of Care (Signed)
Immediate Anesthesia Transfer of Care Note  Patient: [name]  Procedure(s) Performed: Procedure(s): ENDOSCOPIC RETROGRADE CHOLANGIOPANCREATOGRAPHY (ERCP) (N/A)  Patient Location: PACU and Endoscopy Unit  Anesthesia Type:General  Level of Consciousness: awake  Airway & Oxygen Therapy: Patient Spontanous Breathing and Patient connected to nasal cannula oxygen  Post-op Assessment: Report given to RN and Post -op Vital signs reviewed and stable  Post vital signs: Reviewed and stable  Last Vitals:  Filed Vitals:   08/19/15 1322 08/19/15 1519  BP: 141/67   Pulse: 86   Temp: 37.2 C 36.4 C  Resp: 18     Complications: No apparent anesthesia complications

## 2015-08-19 NOTE — Anesthesia Procedure Notes (Signed)
Date/Time: 08/19/2015 2:12 PM Performed by: Kennon Holter Pre-anesthesia Checklist: Timeout performed, Patient being monitored, Suction available, Emergency Drugs available and Patient identified Oxygen Delivery Method: Nasal cannula Preoxygenation: Pre-oxygenation with 100% oxygen Intubation Type: IV induction

## 2015-08-19 NOTE — Op Note (Signed)
[facility] Gastroenterology Patient Name: [name] Procedure Date: 08/19/2015 2:12 PM MRN: [id number] Account #: [id number] Date of Birth: Nov 08, 1928 Admit Type: Outpatient Age: 82 Room: [facility] ENDO ROOM 4 Gender: Female Note Status: Finalized Procedure:            ERCP Indications:          Jaundice, Tumor of the head of pancreas Providers:            Lucilla Lame, MD Referring MD:         Tracie Harrier, MD (Referring MD) Medicines:            Propofol per Anesthesia Complications:        No immediate complications. Procedure:            Pre-Anesthesia Assessment:                       - Prior to the procedure, a History and Physical was                        performed, and patient medications and allergies were                        reviewed. The patient's tolerance of previous                        anesthesia was also reviewed. The risks and benefits of                        the procedure and the sedation options and risks were                        discussed with the patient. All questions were                        answered, and informed consent was obtained. Prior                        Anticoagulants: The patient has taken no previous                        anticoagulant or antiplatelet agents. ASA Grade                        Assessment: II - A patient with mild systemic disease.                        After reviewing the risks and benefits, the patient was                        deemed in satisfactory condition to undergo the                        procedure.                       After obtaining informed consent, the scope was passed                        under direct vision. Throughout the procedure, the  patient's blood pressure, pulse, and oxygen saturations                        were monitored continuously. The Enteroscope was                        introduced through the mouth, and used to inject            contrast into and used to inject contrast into the bile                        duct. The ERCP was accomplished without difficulty. The                        patient tolerated the procedure well. Findings:      A scout film of the abdomen was obtained. Surgical clips were seen in       the area of the cystic duct. The major papilla was normal. The bile duct       was deeply cannulated with the short-nosed traction sphincterotome.       Contrast was injected. I personally interpreted the bile duct images.       There was brisk flow of contrast through the ducts. Image quality was       excellent. Contrast extended to the bifurcation. The upper third of the       main bile duct was markedly dilated, acquired. The largest diameter was       25 mm. The lower third of the main bile duct contained a single       segmental stenosis 60 mm in length. A wire was passed into the biliary       tree. The biliary sphincterotomy was extended with a traction (standard)       sphincterotome using ERBE electrocautery. There was no       post-sphincterotomy bleeding. One 10 Fr by 9 cm temporary stent with a       single external flap and a single internal flap was placed 7 cm into the       common bile duct. Bile flowed through the stent. The stent was in good       position. Impression:           - The major papilla appeared normal.                       - A segmental biliary stricture was found.                       - The upper third of the main bile duct was markedly                        dilated, acquired.                       - A biliary sphincterotomy was performed.                       - One temporary stent was placed into the common bile                        duct. Recommendation:       - Watch for pancreatitis, bleeding, perforation,  and                        cholangitis. Procedure Code(s):    --- Professional ---                       [phone], Endoscopic retrograde  cholangiopancreatography                        (ERCP); with placement of endoscopic stent into biliary                        or pancreatic duct, including pre- and post-dilation                        and guide wire passage, when performed, including                        sphincterotomy, when performed, each stent                       [id number], Endoscopic catheterization of the biliary ductal                        system, radiological supervision and interpretation Diagnosis Code(s):    --- Professional ---                       K83.1, Obstruction of bile duct                       R17, Unspecified jaundice                       D49.0, Neoplasm of unspecified behavior of digestive                        system                       K83.8, Other specified diseases of biliary tract CPT copyright 2016 American Medical Association. All rights reserved. The codes documented in this report are preliminary and upon coder review may  be revised to meet current compliance requirements. Lucilla Lame, MD 08/19/2015 3:17:03 PM This report has been signed electronically. Number of Addenda: 0 Note Initiated On: 08/19/2015 2:12 PM      [facility]

## 2015-08-19 NOTE — Anesthesia Postprocedure Evaluation (Signed)
Anesthesia Post Note  Patient: Best boy  Procedure(s) Performed: Procedure(s) (LRB): ENDOSCOPIC RETROGRADE CHOLANGIOPANCREATOGRAPHY (ERCP) (N/A)  Patient location during evaluation: Endoscopy Anesthesia Type: General Level of consciousness: awake and alert Pain management: pain level controlled Vital Signs Assessment: post-procedure vital signs reviewed and stable Respiratory status: spontaneous breathing, nonlabored ventilation, respiratory function stable and patient connected to nasal cannula oxygen Cardiovascular status: blood pressure returned to baseline and stable Postop Assessment: no signs of nausea or vomiting Anesthetic complications: no    Last Vitals:  Filed Vitals:   08/19/15 1540 08/19/15 1550  BP: 142/71 131/65  Pulse: 90 80  Temp:    Resp: 15 18    Last Pain: There were no vitals filed for this visit.               Precious Haws Piscitello

## 2015-08-19 NOTE — H&P (Signed)
[facility]  [address], [place], [place] [address] Phone: [phone] Fax : [phone]  Primary Care Physician:  Tracie Harrier, MD Primary Gastroenterologist:  Dr. Allen Norris  Pre-Procedure History & Physical: HPI:  [name] is a 82 y.o. female is here for an ERCP.   Past Medical History  Diagnosis Date  . Pancreatitis   . Hepatitis C   . Femur fracture ([place])   . Diabetes mellitus, type 2 ([place]) 11/01/2014    Past Surgical History  Procedure Laterality Date  . Total hip arthroplasty Left 1981  . Abdominal hysterectomy    . Appendectomy    . Cholecystectomy    . Intracapsular cataract extraction Left 2005  . Eus N/A 11/18/2014    Procedure: ESOPHAGEAL ENDOSCOPIC ULTRASOUND (EUS) RADIAL;  Surgeon: Cora Daniels, MD;  Location: [facility] ENDOSCOPY;  Service: Endoscopy;  Laterality: N/A;  . Orif of left femur fracture  04/2007    Prior to Admission medications   Medication Sig Start Date End Date Taking? Authorizing Provider  glipiZIDE (GLUCOTROL) 5 MG tablet Take 5 mg by mouth 2 (two) times daily before a meal.   Yes Historical Provider, MD  lipase/protease/amylase (CREON) 12000 UNITS CPEP capsule Take 36,000 Units by mouth 3 (three) times daily with meals.   Yes Historical Provider, MD  metFORMIN (GLUCOPHAGE) 500 MG tablet Take 1 tablet by mouth daily. 10/18/14  Yes Historical Provider, MD  acetaminophen (TYLENOL) 500 MG tablet Take 1,000 mg by mouth every 6 (six) hours as needed for moderate pain. Reported on 08/12/2015    Historical Provider, MD  acetaminophen (TYLENOL) 650 MG suppository Place 1 suppository (650 mg total) rectally every 6 (six) hours as needed for mild pain (or Fever >/= 101). Patient not taking: Reported on 08/12/2015 12/29/14   Tracie Harrier, MD  alum & mag hydroxide-simeth (MAALOX/MYLANTA) 200-200-20 MG/5ML suspension Take 30 mLs by mouth every 4 (four) hours as needed for indigestion or heartburn (dyspepsia). Patient not  taking: Reported on 01/05/2015 10/25/14   Dustin Flock, MD  docusate sodium (COLACE) 100 MG capsule Take 1 capsule (100 mg total) by mouth 2 (two) times daily. Patient not taking: Reported on 08/12/2015 10/25/14   Dustin Flock, MD  docusate sodium (COLACE) 100 MG capsule Take 1 capsule (100 mg total) by mouth 2 (two) times daily. Patient not taking: Reported on 08/12/2015 12/29/14   Tracie Harrier, MD  ondansetron (ZOFRAN-ODT) 8 MG disintegrating tablet Take 8 mg by mouth every 8 (eight) hours as needed for nausea. Reported on 08/12/2015 08/08/14   Historical Provider, MD  oxyCODONE-acetaminophen (PERCOCET) 7.5-325 MG per tablet Take 1 tablet by mouth every 4 (four) hours as needed for severe pain. Patient not taking: Reported on 08/12/2015 10/25/14   Dustin Flock, MD  polyethylene glycol (MIRALAX / GLYCOLAX) packet Take 17 g by mouth daily. Patient not taking: Reported on 08/12/2015 10/25/14   Dustin Flock, MD  potassium chloride SA (K-DUR,KLOR-CON) 20 MEQ tablet Take 20 mEq by mouth daily.    Historical Provider, MD  ranitidine (ZANTAC) 150 MG tablet Take 150 mg by mouth 2 (two) times daily.    Historical Provider, MD    Allergies as of 08/15/2015 - Review Complete 08/12/2015  Allergen Reaction Noted  . Aspirin Other (See Comments) 10/19/2014  . Omeprazole Nausea Only 12/23/2014    Family History  Problem Relation Age of Onset  . Hypertension Mother     Social History   Social History  . Marital Status: Divorced    Spouse Name:  N/A  . Number of Children: N/A  . Years of Education: N/A   Occupational History  . Not on file.   Social History Main Topics  . Smoking status: Never Smoker   . Smokeless tobacco: Never Used  . Alcohol Use: 0.0 oz/week    0 Standard drinks or equivalent per week     Comment: Infrequent use; special occasion;  . Drug Use: No  . Sexual Activity: No   Other Topics Concern  . Not on file   Social History Narrative    Review of Systems: See HPI,  otherwise negative ROS  Physical Exam: BP 141/67 mmHg  Pulse 86  Temp(Src) 99 F (37.2 C) (Tympanic)  Resp 18  Ht 5\' 2"  (1.575 m)  Wt 125 lb (56.7 kg)  BMI 22.86 kg/m2  SpO2 99% General:   Alert,  pleasant and cooperative in NAD, + Jaundice. Head:  Normocephalic and atraumatic. Neck:  Supple; no masses or thyromegaly. Lungs:  Clear throughout to auscultation.    Heart:  Regular rate and rhythm. Abdomen:  Soft, nontender and nondistended. Normal bowel sounds, without guarding, and without rebound.   Neurologic:  Alert and  oriented x4;  grossly normal neurologically.  Impression/Plan: [name] is here for an ERCP to be performed for jaundice  Risks, benefits, limitations, and alternatives regarding  ERCP have been reviewed with the patient.  Questions have been answered.  All parties agreeable.   Ollen Bowl, MD  08/19/2015, 1:53 PM

## 2015-08-19 NOTE — Anesthesia Preprocedure Evaluation (Signed)
Anesthesia Evaluation  Patient identified by MRN, date of birth, ID band Patient awake    Reviewed: Allergy & Precautions, NPO status , Patient's Chart, lab work & pertinent test results  Airway Mallampati: III   Neck ROM: Full  Mouth opening: Limited Mouth Opening  Dental  (+) Caps Several top front caps:   Pulmonary neg pulmonary ROS,    Pulmonary exam normal        Cardiovascular hypertension, Normal cardiovascular exam Rhythm:Regular Rate:Normal     Neuro/Psych negative neurological ROS  negative psych ROS   GI/Hepatic (+) Hepatitis -Pancreatic mass   Endo/Other  diabetes, Well Controlled, Type 2, Oral Hypoglycemic Agents  Renal/GU   negative genitourinary   Musculoskeletal negative musculoskeletal ROS (+)   Abdominal Normal abdominal exam  (+)   Peds negative pediatric ROS (+)  Hematology negative hematology ROS (+)   Anesthesia Other Findings Past Medical History:   Pancreatitis                                                 Hepatitis C                                                  Femur fracture (HCC)                                         Diabetes mellitus, type 2 ([place])                 11/01/2014    Reproductive/Obstetrics negative OB ROS                             Anesthesia Physical  Anesthesia Plan  ASA: III  Anesthesia Plan: General   Post-op Pain Management:    Induction: Intravenous  Airway Management Planned: Nasal Cannula  Additional Equipment:   Intra-op Plan:   Post-operative Plan:   Informed Consent: I have reviewed the patients History and Physical, chart, labs and discussed the procedure including the risks, benefits and alternatives for the proposed anesthesia with the patient or authorized representative who has indicated his/her understanding and acceptance.   Dental advisory given  Plan Discussed with: CRNA and Surgeon  Anesthesia Plan  Comments:         Anesthesia Quick Evaluation

## 2015-08-19 NOTE — Telephone Encounter (Signed)
Patients mother, [name], is requesting to speak with Ginger. [name] states it is reference to patient having an endoscopy with Dr Allen Norris this afternoon and that it is very important she speak with Ginger. Please call at your convenience. Thanks.

## 2015-08-22 ENCOUNTER — Encounter: Payer: Self-pay | Admitting: Gastroenterology

## 2015-08-23 ENCOUNTER — Telehealth: Payer: Self-pay | Admitting: Oncology

## 2015-08-23 NOTE — Telephone Encounter (Signed)
We can set up hospice over the phone if thats what they want.  Also, I do not necessarily need to see her, but we can check labs in the next 2-3 weeks if the want.

## 2015-08-23 NOTE — Telephone Encounter (Signed)
[name] said she thought Woodfin Ganja told her that if pt has stent implanted he might want to see her sooner than May. She had the stent placed on Friday, 08/19/2015. Would he like to see her sooner? Please advise.

## 2015-08-23 NOTE — Telephone Encounter (Signed)
[name] would like to talk with you about getting an order for Hospice. She doesn't want her mom to go stay in the hospice home but is interested in having them come to the house to provide services. Please call [name] to discuss this.

## 2015-08-24 ENCOUNTER — Other Ambulatory Visit: Payer: Self-pay

## 2015-08-24 NOTE — Telephone Encounter (Signed)
Spoke with [name] and she is not interested in Hospice care at this time but was interested in some more information about how Hospice works.  I explained to her that when the patient and/or the family is ready to welcome Hospice in we refer to Hospice and they usually call same day or next day to set up an in home assessment.  I also told her about some of the benefits of Hospice.

## 2015-08-24 NOTE — Telephone Encounter (Signed)
Left message for Donna to call back to discuss. 

## 2015-08-24 NOTE — Telephone Encounter (Signed)
Patient returned call and left a message to call her at [phone].  Tried calling her back but no answer, left message for her to call back.

## 2015-08-24 NOTE — Progress Notes (Signed)
[facility] day:  02/09/2015  Chief Complaint: [name] is a 82 y.o. female with a pancreatic mass, extensive thrombosis of the portal vein, splenic vein, and superior mesenteric vein with subsequent large left thigh bleed on anti-coagulation.  HPI: Patient returns to clinic today for repeat laboratory work and further evaluation. She has become significantly jaundiced in the interim, but otherwise feels well. She has no neurologic complaints. She denies any recent fevers. She has a poor appetite, but denies weight loss. She has no chest pain or shortness of breath. She denies any abdominal pain. She has occasional nausea, but denies any vomiting, constipation, or diarrhea. She continues to remain active. Patient offers no further specific complaints.   Past Medical History  Diagnosis Date  . Pancreatitis   . Hepatitis C   . Femur fracture ([place])   . Diabetes mellitus, type 2 ([place]) 11/01/2014    Past Surgical History  Procedure Laterality Date  . Total hip arthroplasty Left 1981  . Abdominal hysterectomy    . Appendectomy    . Cholecystectomy    . Intracapsular cataract extraction Left 2005  . Eus N/A 11/18/2014    Procedure: ESOPHAGEAL ENDOSCOPIC ULTRASOUND (EUS) RADIAL;  Surgeon: Cora Daniels, MD;  Location: [facility] ENDOSCOPY;  Service: Endoscopy;  Laterality: N/A;  . Orif of left femur fracture  04/2007  . Ercp N/A 08/19/2015    Procedure: ENDOSCOPIC RETROGRADE CHOLANGIOPANCREATOGRAPHY (ERCP);  Surgeon: Lucilla Lame, MD;  Location: [facility] ENDOSCOPY;  Service: Endoscopy;  Laterality: N/A;    Family History  Problem Relation Age of Onset  . Hypertension Mother     Social History:  reports that she has never smoked. She has never used smokeless tobacco. She reports that she drinks alcohol. She reports that she does not use illicit drugs.  She is from [place].  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  She was  discharged from [place].  The patient is accompanied by her daughter, [name].  Allergies:  Allergies  Allergen Reactions  . Aspirin Other (See Comments)    Stomach problems  . Omeprazole Nausea Only    Current Medications: Current Outpatient Prescriptions  Medication Sig Dispense Refill  . lipase/protease/amylase (CREON) 12000 UNITS CPEP capsule Take 36,000 Units by mouth 3 (three) times daily with meals.    . metFORMIN (GLUCOPHAGE) 500 MG tablet Take 1 tablet by mouth daily.    . potassium chloride SA (K-DUR,KLOR-CON) 20 MEQ tablet Take 20 mEq by mouth daily.    . ranitidine (ZANTAC) 150 MG tablet Take 150 mg by mouth 2 (two) times daily.    Marland Kitchen acetaminophen (TYLENOL) 500 MG tablet Take 1,000 mg by mouth every 6 (six) hours as needed for moderate pain. Reported on 08/12/2015    . acetaminophen (TYLENOL) 650 MG suppository Place 1 suppository (650 mg total) rectally every 6 (six) hours as needed for mild pain (or Fever >/= 101). (Patient not taking: Reported on 08/12/2015) 12 suppository 0  . alum & mag hydroxide-simeth (MAALOX/MYLANTA) 200-200-20 MG/5ML suspension Take 30 mLs by mouth every 4 (four) hours as needed for indigestion or heartburn (dyspepsia). (Patient not taking: Reported on 01/05/2015) 355 mL 0  . docusate sodium (COLACE) 100 MG capsule Take 1 capsule (100 mg total) by mouth 2 (two) times daily. (Patient not taking: Reported on 08/12/2015) 10 capsule 0  . docusate sodium (COLACE) 100 MG capsule Take 1 capsule (100 mg total) by mouth 2 (two) times daily. (  Patient not taking: Reported on 08/12/2015) 10 capsule 0  . glipiZIDE (GLUCOTROL) 5 MG tablet Take 5 mg by mouth 2 (two) times daily before a meal.    . ondansetron (ZOFRAN-ODT) 8 MG disintegrating tablet Take 8 mg by mouth every 8 (eight) hours as needed for nausea. Reported on 08/12/2015    . oxyCODONE-acetaminophen (PERCOCET) 7.5-325 MG per tablet Take 1 tablet by mouth every 4 (four) hours as needed for severe pain. (Patient not  taking: Reported on 08/12/2015) 30 tablet 0  . polyethylene glycol (MIRALAX / GLYCOLAX) packet Take 17 g by mouth daily. (Patient not taking: Reported on 08/12/2015) 14 each 0   No current facility-administered medications for this visit.    Review of Systems:  GENERAL:  Feels great.  No fevers or sweats. PERFORMANCE STATUS (ECOG):  1 HEENT:  No visual changes, runny nose, sore throat, mouth sores or tenderness. Lungs: No shortness of breath or cough.  No hemoptysis. Cardiac:  No chest pain, palpitations, orthopnea, or PND. GI:  Appetite 50% normal.  No abdominal pain.  No nausea, vomiting, diarrhea, constipation, melena or hematochezia. GU:  No urgency, frequency, dysuria, or hematuria. Musculoskeletal:  No back pain.  No joint pain.  No muscle tenderness. Extremities:  No pain or swelling. Skin:  No rashes or skin changes. Neuro:  No headache, numbness or weakness, balance or coordination issues. Endocrine:  Diabetes.  No thyroid issues, hot flashes or night sweats. Psych:  No mood changes, depression or anxiety. Pain:  No focal pain.  Review of systems:  All other systems reviewed and found to be negative.   Physical Exam: Blood pressure 147/77, pulse 77, temperature 98 F (36.7 C), temperature source Tympanic, resp. rate 20, weight 126 lb 8.7 oz (57.4 kg).   General: Well-developed, well-nourished, no acute distress. Eyes: Pink conjunctiva, Icteric sclera. HEENT: Normocephalic, moist mucous membranes, clear oropharnyx. Lungs: Clear to auscultation bilaterally. Heart: Regular rate and rhythm. No rubs, murmurs, or gallops. Abdomen: Soft, nontender, nondistended. No organomegaly noted, normoactive bowel sounds. Musculoskeletal: No edema, cyanosis, or clubbing. Neuro: Alert, answering all questions appropriately. Cranial nerves grossly intact. Skin: Jaundice. Psych: Normal affect.   Appointment on 08/12/2015  Component Date Value Ref Range Status  . CA 19-9 08/12/2015 2703* 0 -  35 U/mL Final   Comment: (NOTE) Results confirmed on dilution. Roche ECLIA methodology Performed At: [facility] [place], [place] [id number] Lindon Romp MD JG:[id number]   . WBC 08/12/2015 8.2  3.6 - 11.0 K/uL Final  . RBC 08/12/2015 3.56* 3.80 - 5.20 MIL/uL Final  . Hemoglobin 08/12/2015 11.9* 12.0 - 16.0 g/dL Final  . HCT 08/12/2015 36.0  35.0 - 47.0 % Final  . MCV 08/12/2015 101.1* 80.0 - 100.0 fL Final  . MCH 08/12/2015 33.3  26.0 - 34.0 pg Final  . MCHC 08/12/2015 32.9  32.0 - 36.0 g/dL Final  . RDW 08/12/2015 18.0* 11.5 - 14.5 % Final  . Platelets 08/12/2015 214  150 - 440 K/uL Final  . Neutrophils Relative % 08/12/2015 86   Final  . Lymphocytes Relative 08/12/2015 6   Final  . Monocytes Relative 08/12/2015 7   Final  . Eosinophils Relative 08/12/2015 0   Final  . Basophils Relative 08/12/2015 0   Final  . Myelocytes 08/12/2015 1   Final  . Neutro Abs 08/12/2015 7.1* 1.4 - 6.5 K/uL Final  . Lymphs Abs 08/12/2015 0.5* 1.0 - 3.6 K/uL Final  . Monocytes Absolute 08/12/2015 0.6  0.2 -  0.9 K/uL Final  . Eosinophils Absolute 08/12/2015 0.0  0 - 0.7 K/uL Final  . Basophils Absolute 08/12/2015 0.0  0 - 0.1 K/uL Final  . Sodium 08/12/2015 133* 135 - 145 mmol/L Final  . Potassium 08/12/2015 4.2  3.5 - 5.1 mmol/L Final  . Chloride 08/12/2015 102  101 - 111 mmol/L Final  . CO2 08/12/2015 23  22 - 32 mmol/L Final  . Glucose, Bld 08/12/2015 333* 65 - 99 mg/dL Final  . BUN 08/12/2015 15  6 - 20 mg/dL Final  . Creatinine, Ser 08/12/2015 UNABLE TO REPORT DUE TO ICTERUS  0.44 - 1.00 mg/dL Final  . Calcium 08/12/2015 9.0  8.9 - 10.3 mg/dL Final  . Total Protein 08/12/2015 7.5  6.5 - 8.1 g/dL Final  . Albumin 08/12/2015 2.9* 3.5 - 5.0 g/dL Final  . AST 08/12/2015 362* 15 - 41 U/L Final  . ALT 08/12/2015 230* 14 - 54 U/L Final   ICTERUS AT THIS LEVEL MAY AFFECT RESULT  . Alkaline Phosphatase 08/12/2015 783* 38 - 126 U/L Final  . Total Bilirubin 08/12/2015  29.7* 0.3 - 1.2 mg/dL Final   Comment: CRITICAL RESULT CALLED TO, READ BACK BY AND VERIFIED WITH DR Grayland Ormond @1135  [facility]   . GFR calc non Af Amer 08/12/2015 NOT CALCULATED  >60 mL/min Final  . GFR calc Af Amer 08/12/2015 NOT CALCULATED  >60 mL/min Final   Comment: (NOTE) The eGFR has been calculated using the CKD EPI equation. This calculation has not been validated in all clinical situations. eGFR's persistently <60 mL/min signify possible Chronic Kidney Disease.   . Anion gap 08/12/2015 8  5 - 15 Final   Lab Results  Component Value Date   CA199 2703* 08/12/2015    Assessment:  [name] is an 82 y.o. female with a pancreatic mass and extensive thrombosis of the portal vein, splenic and mesenteric vein presenting with abdominal pain.    Abdominal MRI on 10/21/2014 revealed thrombosis in the right and left portal vein, main portal vein, splenic vein and superior mesenteric vein. There was a 2.8 x 2.3 cm infiltrative pancreatic head mass causing stricture of the distal common bile duct. There was edema at the root of the mesentery.  CA 19-9 was 329 on 10/21/2014 and 565 on 02/09/2015.  Endoscopic ultrasound and biopsy on 11/18/2014 revealed a long segment of Barrett's esophagus (15 cm).  Biopsies were taken. There was an irregular hypoechoic mass in genu of the pancreas measuring 3.7 x 2.1 cm.  Margins were irregular suggesting invasion into the portal vein. Cytology from FNA of pancreatic mass revealed scant fragments of mucinous epithelium with minimal atypia.  Endoscopic staging staging suggested a T2N0 lesion.  She was seen at the biliary clinic at [facility].  She declined biopsy.  She was admitted to [facility] from 07/10-07/13/2016 secondary to a large left thigh hematoma. Lovenox was discontinued secondary to the major bleed without plans for restarting anticoagulation. She states that she would not accept treatment for pancreatic cancer.  Code stats is DNR/DNI.  Symptomatically, she is  doing well. She denies any complaints.  Plan: 1.  Pancreatic mass: Although recent attempt for diagnosis was inconclusive, patient's CA-19-9 continues to increase and she clearly has progressive disease given her jaundice and CT scan completed on July 04, 2015. Patient does not wish to undergo biopsy to confirm pancreatic cancer. She also states she would continue to refuse treatment if confirmed.  She is also refusing hospice care at this time.  Return to  clinic in 3 months with repeat laboratory work and further evaluation. 2. Jaundice: Will send a referral to GI for possible ERCP and stenting.  Approximately 30 minutes was spent in discussion and consultation.   Lloyd Huger, MD  02/09/2015, 11:24 AM

## 2015-08-25 NOTE — Telephone Encounter (Signed)
Ok to move appt up to this Friday or next, which ever has an opening

## 2015-09-07 NOTE — Telephone Encounter (Signed)
erroneous

## 2015-09-12 ENCOUNTER — Ambulatory Visit: Payer: PPO | Admitting: *Deleted

## 2015-09-16 ENCOUNTER — Ambulatory Visit: Payer: PPO | Admitting: *Deleted

## 2015-09-26 ENCOUNTER — Ambulatory Visit: Payer: PPO | Admitting: *Deleted

## 2015-10-16 IMAGING — PT NM PET TUM IMG INITIAL (PI) SKULL BASE T - THIGH
1 of 9 series · 1 of 25 positions shown · non-contrast
Comparison: MRI 10/21/2014, CT 10/19/2014

CLINICAL DATA: Initial treatment strategy for pancreatic mass.

EXAM:
NUCLEAR MEDICINE PET SKULL BASE TO THIGH
TECHNIQUE: 12.4 mCi F-18 FDG was injected intravenously. Full-ring PET imaging
was performed from the skull base to thigh after the radiotracer. CT
data was obtained and used for attenuation correction and anatomic
localization.
FASTING BLOOD GLUCOSE:  Value: 196 mg/dl

[Series 3: ct wb 5.0 b30f · axial · 5.0mm · 0.98mm/px · 1 of 290 slices shown]
[im 290/290  brain]
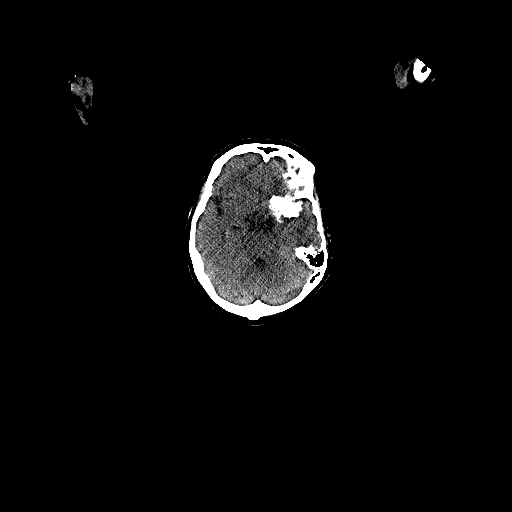

[1 of 25 positions shown; findings below may reference images not displayed]

FINDINGS: NECK

No hypermetabolic lymph nodes in the neck. On the first image there
is a calcific mass extending along the medial aspect of the left
middle cranial fossa abutting the skull base measuring 3 cm by
cm (image number 1). There is a significant metabolic axis activity
associated with this a calcific mass.

CHEST

No hypermetabolic mediastinal lymph nodes. Small subpleural nodule
in the right lower lobe measures 4 mm on image 117 of series 3.
Nodule along the right horizontal fissure is elongated with a benign
morphology measuring 4 mm on image 113 series 3.

ABDOMEN/PELVIS

There is no discrete hypermetabolic mass associated with the
pancreas. The pancreatic body and ducts are difficult to define.
There is 1 focus of metabolic activity above background in the
region of pancreatic head measuring 1 cm on image [DATE].
The activity of this focus is mild with SUV max 2.6.

There is again demonstrated extensive thrombus within the portal
vein, splenic vein and superior mesenteric vein. This thrombus is
not hypermetabolic suggesting bland thrombus. There is no abnormal
metabolic activity in the liver. No hypermetabolic periportal lymph
nodes.

There is evidence of portal hypertension with venous collaterals in
the splenic hilum and superior to the spleen. There is a large
hiatal hernia.

No hypermetabolic abdominal pelvic lymph nodes. Hysterectomy.
Adrenal glands are normal.

SKELETON

No focal hypermetabolic activity to suggest skeletal metastasis.
IMPRESSION: 1. No clear focal metabolic activity within the pancreas to suggest
pancreatic adenocarcinoma. Single focus of activity in the region of
the pancreatic head is nonspecific.
2. Extensive venous thrombosis within the portal vein, superior
mesenteric vein, and splenic vein with venous collateralization.
Collateralization suggests a chronic process.
3. No evidence of distant metastasis.
4. Small right pulmonary nodules are likely benign.
5. Large calcific lesion in the medial left middle cranial fossa
along the skullbase is likely a benign meningioma.

## 2015-11-10 ENCOUNTER — Telehealth: Payer: Self-pay | Admitting: Oncology

## 2015-11-10 ENCOUNTER — Telehealth: Payer: Self-pay | Admitting: Gastroenterology

## 2015-11-10 NOTE — Telephone Encounter (Signed)
Please cancel lab appt for tomorrow, she does not need them repeated. Tillie Rung has results from [facility]

## 2015-11-10 NOTE — Telephone Encounter (Signed)
Patients daughter called and wanted to know if you have talked to Dr. Allen Norris yet?

## 2015-11-10 NOTE — Telephone Encounter (Signed)
She had CBC, CMP, CA 19-9 drawn on 10/17/15

## 2015-11-10 NOTE — Telephone Encounter (Signed)
Patient had labs drawn at Dr. Linton Ham office last week. Can you check and see if it is the same labs she is supposed to do tomorrow for [place]? If so, can we cancel her appt for lab portion of visit tomorrow? Thanks. Please call [phone]

## 2015-11-11 ENCOUNTER — Other Ambulatory Visit: Payer: PPO

## 2015-11-11 ENCOUNTER — Inpatient Hospital Stay: Payer: PPO | Attending: Oncology | Admitting: Oncology

## 2015-11-11 ENCOUNTER — Ambulatory Visit: Payer: PPO | Admitting: Oncology

## 2015-11-11 VITALS — BP 136/68 | HR 73 | Temp 98.6°F | Resp 20 | Wt 126.8 lb

## 2015-11-11 DIAGNOSIS — B1920 Unspecified viral hepatitis C without hepatic coma: Secondary | ICD-10-CM | POA: Insufficient documentation

## 2015-11-11 DIAGNOSIS — Z96649 Presence of unspecified artificial hip joint: Secondary | ICD-10-CM | POA: Insufficient documentation

## 2015-11-11 DIAGNOSIS — E119 Type 2 diabetes mellitus without complications: Secondary | ICD-10-CM | POA: Diagnosis not present

## 2015-11-11 DIAGNOSIS — Z7984 Long term (current) use of oral hypoglycemic drugs: Secondary | ICD-10-CM | POA: Insufficient documentation

## 2015-11-11 DIAGNOSIS — Z79899 Other long term (current) drug therapy: Secondary | ICD-10-CM | POA: Diagnosis not present

## 2015-11-11 DIAGNOSIS — I81 Portal vein thrombosis: Secondary | ICD-10-CM

## 2015-11-11 DIAGNOSIS — K869 Disease of pancreas, unspecified: Secondary | ICD-10-CM | POA: Diagnosis not present

## 2015-11-11 DIAGNOSIS — K8689 Other specified diseases of pancreas: Secondary | ICD-10-CM

## 2015-11-11 NOTE — Progress Notes (Signed)
[facility] day:  02/09/2015  Chief Complaint: [name] is a 82 y.o. female with a pancreatic mass, extensive thrombosis of the portal vein, splenic vein, and superior mesenteric vein with subsequent large left thigh bleed on anti-coagulation.  HPI: Patient returns to clinic today for repeat laboratory work and further evaluation. She had bile stent placement in February 2017. Since that time she feels significantly improved. She does not complain of weakness or fatigue. She has improved appetite and is gaining weight. She has no neurologic complaints. She denies any recent fevers. She has no chest pain or shortness of breath. She denies any abdominal pain. She denies any nausea, vomiting, constipation, or diarrhea. She continues to remain active. Patient offers no specific complaints today.     Past Medical History  Diagnosis Date  . Pancreatitis   . Hepatitis C   . Femur fracture ([place])   . Diabetes mellitus, type 2 ([place]) 11/01/2014    Past Surgical History  Procedure Laterality Date  . Total hip arthroplasty Left 1981  . Abdominal hysterectomy    . Appendectomy    . Cholecystectomy    . Intracapsular cataract extraction Left 2005  . Eus N/A 11/18/2014    Procedure: ESOPHAGEAL ENDOSCOPIC ULTRASOUND (EUS) RADIAL;  Surgeon: Cora Daniels, MD;  Location: [facility] ENDOSCOPY;  Service: Endoscopy;  Laterality: N/A;  . Orif of left femur fracture  04/2007  . Ercp N/A 08/19/2015    Procedure: ENDOSCOPIC RETROGRADE CHOLANGIOPANCREATOGRAPHY (ERCP);  Surgeon: Lucilla Lame, MD;  Location: [facility] ENDOSCOPY;  Service: Endoscopy;  Laterality: N/A;    Family History  Problem Relation Age of Onset  . Hypertension Mother     Social History:  reports that she has never smoked. She has never used smokeless tobacco. She reports that she drinks alcohol. She reports that she does not use illicit drugs.  She is from [place].  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  She was discharged from [place].  The patient is accompanied by her daughter, [name].  Allergies:  Allergies  Allergen Reactions  . Aspirin Other (See Comments)    Stomach problems  . Omeprazole Nausea Only    Current Medications: Current Outpatient Prescriptions  Medication Sig Dispense Refill  . acetaminophen (TYLENOL) 500 MG tablet Take 1,000 mg by mouth every 6 (six) hours as needed for moderate pain. Reported on 08/12/2015    . glipiZIDE (GLUCOTROL) 5 MG tablet Take 5 mg by mouth 2 (two) times daily before a meal.    . lipase/protease/amylase (CREON) 12000 UNITS CPEP capsule Take 36,000 Units by mouth 3 (three) times daily with meals.    . metFORMIN (GLUCOPHAGE) 500 MG tablet Take 1 tablet by mouth daily.    . potassium chloride SA (K-DUR,KLOR-CON) 20 MEQ tablet Take 20 mEq by mouth daily. 1/2 tab qd    . ranitidine (ZANTAC) 150 MG tablet Take 150 mg by mouth 2 (two) times daily.     No current facility-administered medications for this visit.    Review of Systems:  GENERAL:  Feels great.  No fevers or sweats. PERFORMANCE STATUS (ECOG):  1 HEENT:  No visual changes, runny nose, sore throat, mouth sores or tenderness. Lungs: No shortness of breath or cough.  No hemoptysis. Cardiac:  No chest pain, palpitations, orthopnea, or PND. GI:  Appetite 50% normal.  No abdominal pain.  No nausea, vomiting, diarrhea, constipation, melena or hematochezia. GU:  No urgency, frequency, dysuria, or  hematuria. Musculoskeletal:  No back pain.  No joint pain.  No muscle tenderness. Extremities:  No pain or swelling. Skin:  No rashes or skin changes. Neuro:  No headache, numbness or weakness, balance or coordination issues. Endocrine:  Diabetes.  No thyroid issues, hot flashes or night sweats. Psych:  No mood changes, depression or anxiety. Pain:  No focal pain.  Review of systems:  All other systems reviewed and found to be negative.   Physical  Exam: Blood pressure 136/68, pulse 73, temperature 98.6 F (37 C), temperature source Tympanic, resp. rate 20, weight 126 lb 12.2 oz (57.5 kg).   General: Well-developed, well-nourished, no acute distress. Eyes: Pink conjunctiva, Icteric sclera. HEENT: Normocephalic, moist mucous membranes, clear oropharnyx. Lungs: Clear to auscultation bilaterally. Heart: Regular rate and rhythm. No rubs, murmurs, or gallops. Abdomen: Soft, nontender, nondistended. No organomegaly noted, normoactive bowel sounds. Musculoskeletal: No edema, cyanosis, or clubbing. Neuro: Alert, answering all questions appropriately. Cranial nerves grossly intact. Skin: No jaundice, rashes, or petechiae noted. Psych: Normal affect.   No visits with results within 3 Day(s) from this visit. Latest known visit with results is:  Admission on 08/19/2015, Discharged on 08/19/2015  Component Date Value Ref Range Status  . Glucose-Capillary 08/19/2015 267* 65 - 99 mg/dL Final  . Glucose-Capillary 08/19/2015 171* 65 - 99 mg/dL Final   Lab Results  Component Value Date   CA199 2703* 08/12/2015    Assessment:  [name] is an 82 y.o. female with a pancreatic mass and extensive thrombosis of the portal vein, splenic and mesenteric vein presenting with abdominal pain.    Abdominal MRI on 10/21/2014 revealed thrombosis in the right and left portal vein, main portal vein, splenic vein and superior mesenteric vein. There was a 2.8 x 2.3 cm infiltrative pancreatic head mass causing stricture of the distal common bile duct. There was edema at the root of the mesentery.  CA 19-9 was 329 on 10/21/2014 and 565 on 02/09/2015.  Endoscopic ultrasound and biopsy on 11/18/2014 revealed a long segment of Barrett's esophagus (15 cm).  Biopsies were taken. There was an irregular hypoechoic mass in genu of the pancreas measuring 3.7 x 2.1 cm.  Margins were irregular suggesting invasion into the portal vein. Cytology from FNA of pancreatic  mass revealed scant fragments of mucinous epithelium with minimal atypia.  Endoscopic staging staging suggested a T2N0 lesion.  She was seen at the biliary clinic at [facility].  She declined biopsy.  She was admitted to [facility] from 07/10-07/13/2016 secondary to a large left thigh hematoma. Lovenox was discontinued secondary to the major bleed without plans for restarting anticoagulation. She states that she would not accept treatment for pancreatic cancer.  Code stats is DNR/DNI.  Symptomatically, she is doing well. She denies any complaints.  Plan: 1.  Pancreatic mass: Previous attempts for diagnosis was inconclusive. Patient's CA 19-9 is trending down from 2703 in February 2017 to 1403 in May 2017. Patient had bile duct stent placed in February and is going back in the first 1-2 weeks of June's to have her stent replaced. Patient does not wish to undergo biopsy to confirm pancreatic cancer. She also states she would continue to refuse treatment if confirmed.  Return to clinic in 6 months with repeat laboratory work and further evaluation. 2. Jaundice: Resolved.   Lloyd Huger, MD 11/11/2015 5:11 PM

## 2015-11-15 NOTE — Telephone Encounter (Signed)
If [place] is okay with doing a ERCP on June 7th, are you okay with this? Its a Wednesday. You will need to do a metal stent.

## 2015-11-16 NOTE — Telephone Encounter (Signed)
Yes - that would be fine

## 2015-11-17 ENCOUNTER — Other Ambulatory Visit: Payer: Self-pay

## 2015-11-17 NOTE — Telephone Encounter (Signed)
Pt scheduled for ERCP stent exchange on Wed, June 7th. Pt has been notified of date, time and location.

## 2015-11-18 ENCOUNTER — Ambulatory Visit: Payer: PPO | Admitting: Oncology

## 2015-11-18 ENCOUNTER — Other Ambulatory Visit: Payer: PPO

## 2015-11-21 ENCOUNTER — Telehealth: Payer: Self-pay | Admitting: Gastroenterology

## 2015-11-21 NOTE — Telephone Encounter (Signed)
Patient is waiting for you to call regarding a time for the stint

## 2015-11-23 ENCOUNTER — Ambulatory Visit: Payer: PPO | Admitting: Anesthesiology

## 2015-11-23 ENCOUNTER — Ambulatory Visit
Admission: RE | Admit: 2015-11-23 | Discharge: 2015-11-23 | Disposition: A | Discharge status: Home / Self Care | Payer: PPO | Source: Ambulatory Visit | Attending: Gastroenterology | Admitting: Gastroenterology

## 2015-11-23 ENCOUNTER — Encounter
Admission: RE | Disposition: A | Discharge status: Home / Self Care | Payer: Self-pay | Source: Ambulatory Visit | Attending: Gastroenterology

## 2015-11-23 DIAGNOSIS — Z9889 Other specified postprocedural states: Secondary | ICD-10-CM | POA: Insufficient documentation

## 2015-11-23 DIAGNOSIS — Z9842 Cataract extraction status, left eye: Secondary | ICD-10-CM | POA: Insufficient documentation

## 2015-11-23 DIAGNOSIS — K831 Obstruction of bile duct: Secondary | ICD-10-CM | POA: Diagnosis not present

## 2015-11-23 DIAGNOSIS — Z4659 Encounter for fitting and adjustment of other gastrointestinal appliance and device: Secondary | ICD-10-CM | POA: Insufficient documentation

## 2015-11-23 DIAGNOSIS — Z888 Allergy status to other drugs, medicaments and biological substances status: Secondary | ICD-10-CM | POA: Insufficient documentation

## 2015-11-23 DIAGNOSIS — Z886 Allergy status to analgesic agent status: Secondary | ICD-10-CM | POA: Insufficient documentation

## 2015-11-23 DIAGNOSIS — Z79899 Other long term (current) drug therapy: Secondary | ICD-10-CM | POA: Insufficient documentation

## 2015-11-23 DIAGNOSIS — K859 Acute pancreatitis without necrosis or infection, unspecified: Secondary | ICD-10-CM | POA: Insufficient documentation

## 2015-11-23 DIAGNOSIS — Z7984 Long term (current) use of oral hypoglycemic drugs: Secondary | ICD-10-CM | POA: Insufficient documentation

## 2015-11-23 DIAGNOSIS — Z9049 Acquired absence of other specified parts of digestive tract: Secondary | ICD-10-CM | POA: Insufficient documentation

## 2015-11-23 DIAGNOSIS — D490 Neoplasm of unspecified behavior of digestive system: Secondary | ICD-10-CM | POA: Insufficient documentation

## 2015-11-23 DIAGNOSIS — Z96642 Presence of left artificial hip joint: Secondary | ICD-10-CM | POA: Insufficient documentation

## 2015-11-23 DIAGNOSIS — E119 Type 2 diabetes mellitus without complications: Secondary | ICD-10-CM | POA: Insufficient documentation

## 2015-11-23 DIAGNOSIS — B1920 Unspecified viral hepatitis C without hepatic coma: Secondary | ICD-10-CM | POA: Insufficient documentation

## 2015-11-23 HISTORY — PX: ERCP: SHX5425

## 2015-11-23 LAB — GLUCOSE, CAPILLARY
GLUCOSE-CAPILLARY: 120 mg/dL — AB (ref 65–99)
Glucose-Capillary: 112 mg/dL — ABNORMAL HIGH (ref 65–99)

## 2015-11-23 SURGERY — ERCP, WITH INTERVENTION IF INDICATED
Anesthesia: General

## 2015-11-23 MED ORDER — PROPOFOL 500 MG/50ML IV EMUL
INTRAVENOUS | Status: DC | PRN
  Administered 2015-11-23: 150 ug/kg/min via INTRAVENOUS

## 2015-11-23 MED ORDER — FENTANYL CITRATE (PF) 100 MCG/2ML IJ SOLN
INTRAMUSCULAR | Status: DC | PRN
  Administered 2015-11-23: 50 ug via INTRAVENOUS

## 2015-11-23 MED ORDER — SODIUM CHLORIDE 0.9 % IV SOLN
INTRAVENOUS | Status: DC
  Administered 2015-11-23: 14:00:00 via INTRAVENOUS

## 2015-11-23 MED ORDER — LIDOCAINE HCL (CARDIAC) 20 MG/ML IV SOLN
INTRAVENOUS | Status: DC | PRN
  Administered 2015-11-23: 50 mg via INTRAVENOUS
  Administered 2015-11-23: 30 mg via INTRAVENOUS

## 2015-11-23 MED ORDER — PROPOFOL 10 MG/ML IV BOLUS
INTRAVENOUS | Status: DC | PRN
  Administered 2015-11-23: 30 mg via INTRAVENOUS
  Administered 2015-11-23: 40 mg via INTRAVENOUS

## 2015-11-23 MED ORDER — METOPROLOL TARTRATE 5 MG/5ML IV SOLN
INTRAVENOUS | Status: DC | PRN
  Administered 2015-11-23: 2 mg via INTRAVENOUS

## 2015-11-23 MED ORDER — GLYCOPYRROLATE 0.2 MG/ML IJ SOLN
INTRAMUSCULAR | Status: DC | PRN
  Administered 2015-11-23: 0.2 mg via INTRAVENOUS

## 2015-11-23 SURGICAL SUPPLY — 1 items: billary stent ×3 IMPLANT

## 2015-11-23 NOTE — Op Note (Addendum)
[facility]astroenterology Patient Name: [name] Procedure Date: 11/23/2015 2:27 PM MRN: [id number] Account #: [id number] Date of Birth: [birth year] Admit Type: Outpatient Age: 82 Room: [facility] ENDO ROOM 4 Gender: Female Note Status: Supervisor Override Procedure:            ERCP Indications:          Tumor of the genu of pancreas, Stent change Providers:            Lucilla Lame, MD Referring MD:         Tracie Harrier, MD (Referring MD) Medicines:            Propofol per Anesthesia Complications:        No immediate complications. Procedure:            Pre-Anesthesia Assessment:                       - Prior to the procedure, a History and Physical was                        performed, and patient medications and allergies were                        reviewed. The patient's tolerance of previous                        anesthesia was also reviewed. The risks and benefits of                        the procedure and the sedation options and risks were                        discussed with the patient. All questions were                        answered, and informed consent was obtained. Prior                        Anticoagulants: The patient has taken no previous                        anticoagulant or antiplatelet agents. ASA Grade                        Assessment: II - A patient with mild systemic disease.                        After reviewing the risks and benefits, the patient was                        deemed in satisfactory condition to undergo the                        procedure.                       After obtaining informed consent, the scope was passed                        under direct vision. Throughout the procedure, the  patient's blood pressure, pulse, and oxygen saturations                        were monitored continuously. The Endosonoscope was                        introduced through the mouth, and used to  inject                        contrast into and used to inject contrast into the bile                        duct. The ERCP was accomplished without difficulty. The                        patient tolerated the procedure well. Findings:      A biliary stent was visible on the scout film. The esophagus was       successfully intubated under direct vision. The scope was advanced to a       normal major papilla in the descending duodenum without detailed       examination of the pharynx, larynx and associated structures, and upper       GI tract. The upper GI tract was grossly normal. One stent was removed       from the biliary tree using a snare. The bile duct was deeply cannulated       with the short-nosed traction sphincterotome. Contrast was injected. I       personally interpreted the bile duct images. There was brisk flow of       contrast through the ducts. Image quality was excellent. Contrast       extended to the entire biliary tree. The middle third of the main bile       duct contained a single segmental stenosis. A wire was passed into the       biliary tree. One 10 Fr by 8 cm covered metal stent was placed 6 cm into       the common bile duct. Bile flowed through the stent. The stent was in       good position. Impression:           - A segmental biliary stricture was found.                       - One stent was removed from the biliary tree.                       - One covered metal stent was placed into the common                        bile duct. Recommendation:       - Watch for pancreatitis, bleeding, perforation, and                        cholangitis. Procedure Code(s):    --- Professional ---                       [phone], Endoscopic retrograde cholangiopancreatography                        (ERCP); with  removal and exchange of stent(s), biliary                        or pancreatic duct, including pre- and post-dilation                        and guide wire passage,  when performed, including                        sphincterotomy, when performed, each stent exchanged                       [phone], Endoscopic catheterization of the biliary ductal                        system, radiological supervision and interpretation Diagnosis Code(s):    --- Professional ---                       K83.1, Obstruction of bile duct                       D49.0, Neoplasm of unspecified behavior of digestive                        system                       Z46.59, Encounter for fitting and adjustment of other                        gastrointestinal appliance and device CPT copyright 2016 American Medical Association. All rights reserved. The codes documented in this report are preliminary and upon coder review may  be revised to meet current compliance requirements. Lucilla Lame, MD 11/23/2015 3:06:56 PM This report has been signed electronically. Number of Addenda: 0 Note Initiated On: 11/23/2015 2:27 PM      [facility]

## 2015-11-23 NOTE — Anesthesia Preprocedure Evaluation (Signed)
Anesthesia Evaluation  Patient identified by MRN, date of birth, ID band Patient awake    Reviewed: Allergy & Precautions, H&P , NPO status , Patient's Chart, lab work & pertinent test results, reviewed documented beta blocker date and time   Airway Mallampati: II   Neck ROM: full    Dental  (+) Poor Dentition, Teeth Intact   Pulmonary neg pulmonary ROS,    Pulmonary exam normal        Cardiovascular hypertension, On Medications negative cardio ROS Normal cardiovascular exam Rhythm:regular Rate:Normal     Neuro/Psych negative neurological ROS  negative psych ROS   GI/Hepatic negative GI ROS, Neg liver ROS, (+) Hepatitis -  Endo/Other  negative endocrine ROSdiabetes  Renal/GU negative Renal ROS  negative genitourinary   Musculoskeletal   Abdominal   Peds  Hematology negative hematology ROS (+) anemia ,   Anesthesia Other Findings Past Medical History:   Pancreatitis                                                 Hepatitis C                                                  Femur fracture ([place])                                         Diabetes mellitus, type 2 ([place])                 11/01/2014  Past Surgical History:   TOTAL HIP ARTHROPLASTY                          Left 1981         ABDOMINAL HYSTERECTOMY                                        APPENDECTOMY                                                  CHOLECYSTECTOMY                                               INTRACAPSULAR CATARACT EXTRACTION               Left 2005         EUS                                             N/A 11/18/2014       Comment:Procedure: ESOPHAGEAL ENDOSCOPIC ULTRASOUND               (  EUS) RADIAL;  Surgeon: Cora Daniels,               MD;  Location: [facility] ENDOSCOPY;  Service:               Endoscopy;  Laterality: N/A;   ORIF of left femur fracture                      04/2007      ERCP                                             N/A 08/19/2015       Comment:Procedure: ENDOSCOPIC RETROGRADE               CHOLANGIOPANCREATOGRAPHY (ERCP);  Surgeon:               Lucilla Lame, MD;  Location: [facility] ENDOSCOPY;                Service: Endoscopy;  Laterality: N/A;   Reproductive/Obstetrics                             Anesthesia Physical Anesthesia Plan  ASA: III and emergent  Anesthesia Plan: General   Post-op Pain Management:    Induction:   Airway Management Planned:   Additional Equipment:   Intra-op Plan:   Post-operative Plan:   Informed Consent: I have reviewed the patients History and Physical, chart, labs and discussed the procedure including the risks, benefits and alternatives for the proposed anesthesia with the patient or authorized representative who has indicated his/her understanding and acceptance.   Dental Advisory Given  Plan Discussed with: CRNA  Anesthesia Plan Comments:         Anesthesia Quick Evaluation

## 2015-11-23 NOTE — H&P (Signed)
[facility]  [address], [place], [place] [address] Phone: [phone] Fax : [phone]  Primary Care Physician:  Tracie Harrier, MD Primary Gastroenterologist:  Dr. Allen Norris  Pre-Procedure History & Physical: HPI:  [name] is a 82 y.o. female is here for an ERCP.   Past Medical History  Diagnosis Date  . Pancreatitis   . Hepatitis C   . Femur fracture ([place])   . Diabetes mellitus, type 2 ([place]) 11/01/2014    Past Surgical History  Procedure Laterality Date  . Total hip arthroplasty Left 1981  . Abdominal hysterectomy    . Appendectomy    . Cholecystectomy    . Intracapsular cataract extraction Left 2005  . Eus N/A 11/18/2014    Procedure: ESOPHAGEAL ENDOSCOPIC ULTRASOUND (EUS) RADIAL;  Surgeon: Cora Daniels, MD;  Location: [facility] ENDOSCOPY;  Service: Endoscopy;  Laterality: N/A;  . Orif of left femur fracture  04/2007  . Ercp N/A 08/19/2015    Procedure: ENDOSCOPIC RETROGRADE CHOLANGIOPANCREATOGRAPHY (ERCP);  Surgeon: Lucilla Lame, MD;  Location: [facility] ENDOSCOPY;  Service: Endoscopy;  Laterality: N/A;    Prior to Admission medications   Medication Sig Start Date End Date Taking? Authorizing Provider  acetaminophen (TYLENOL) 500 MG tablet Take 1,000 mg by mouth every 6 (six) hours as needed for moderate pain. Reported on 08/12/2015    Historical Provider, MD  glipiZIDE (GLUCOTROL) 5 MG tablet Take 5 mg by mouth 2 (two) times daily before a meal.    Historical Provider, MD  lipase/protease/amylase (CREON) 12000 UNITS CPEP capsule Take 36,000 Units by mouth 3 (three) times daily with meals.    Historical Provider, MD  metFORMIN (GLUCOPHAGE) 500 MG tablet Take 1 tablet by mouth daily. 10/18/14   Historical Provider, MD  potassium chloride SA (K-DUR,KLOR-CON) 20 MEQ tablet Take 20 mEq by mouth daily. 1/2 tab qd    Historical Provider, MD  ranitidine (ZANTAC) 150 MG tablet Take 150 mg by mouth 2 (two) times daily.    Historical Provider, MD     Allergies as of 11/17/2015 - Review Complete 11/11/2015  Allergen Reaction Noted  . Aspirin Other (See Comments) 10/19/2014  . Omeprazole Nausea Only 12/23/2014    Family History  Problem Relation Age of Onset  . Hypertension Mother     Social History   Social History  . Marital Status: Divorced    Spouse Name: N/A  . Number of Children: N/A  . Years of Education: N/A   Occupational History  . Not on file.   Social History Main Topics  . Smoking status: Never Smoker   . Smokeless tobacco: Never Used  . Alcohol Use: 0.0 oz/week    0 Standard drinks or equivalent per week     Comment: Infrequent use; special occasion;  . Drug Use: No  . Sexual Activity: No   Other Topics Concern  . Not on file   Social History Narrative    Review of Systems: See HPI, otherwise negative ROS  Physical Exam: BP 161/72 mmHg  Pulse 81  Temp(Src) 98.4 F (36.9 C) (Tympanic)  Resp 17  SpO2 97% General:   Alert,  pleasant and cooperative in NAD Head:  Normocephalic and atraumatic. Neck:  Supple; no masses or thyromegaly. Lungs:  Clear throughout to auscultation.    Heart:  Regular rate and rhythm. Abdomen:  Soft, nontender and nondistended. Normal bowel sounds, without guarding, and without rebound.   Neurologic:  Alert and  oriented x4;  grossly normal neurologically.  Impression/Plan: [name] is  here for an ERCP to be performed for Pancreatic mass  Risks, benefits, limitations, and alternatives regarding  ERCP have been reviewed with the patient.  Questions have been answered.  All parties agreeable.   Lucilla Lame, MD  11/23/2015, 3:01 PM

## 2015-11-23 NOTE — Transfer of Care (Addendum)
Immediate Anesthesia Transfer of Care Note  Patient: [name]  Procedure(s) Performed: Procedure(s): ENDOSCOPIC RETROGRADE CHOLANGIOPANCREATOGRAPHY (ERCP) stent exchange  (N/A)  Patient Location: PACU  Anesthesia Type:General  Level of Consciousness: awake, alert , oriented and patient cooperative  Airway & Oxygen Therapy: Patient Spontanous Breathing and Patient connected to nasal cannula oxygen  Post-op Assessment: Report given to RN, Post -op Vital signs reviewed and stable and Patient moving all extremities  Post vital signs: Reviewed and stable  Last Vitals:  Filed Vitals:   11/23/15 1328  BP: 161/72  Pulse: 81  Temp: 36.9 C  Resp: 17    Last Pain: There were no vitals filed for this visit.       Complications: No apparent anesthesia complications

## 2015-11-24 ENCOUNTER — Encounter: Payer: Self-pay | Admitting: Gastroenterology

## 2015-11-24 DIAGNOSIS — Z4659 Encounter for fitting and adjustment of other gastrointestinal appliance and device: Secondary | ICD-10-CM | POA: Insufficient documentation

## 2015-11-25 ENCOUNTER — Encounter: Payer: Self-pay | Admitting: Gastroenterology

## 2015-12-06 NOTE — Anesthesia Postprocedure Evaluation (Signed)
Anesthesia Post Note  Patient: [name]  Procedure(s) Performed: Procedure(s) (LRB): ENDOSCOPIC RETROGRADE CHOLANGIOPANCREATOGRAPHY (ERCP) stent exchange  (N/A)  Patient location during evaluation: PACU Anesthesia Type: General Level of consciousness: awake and alert Pain management: pain level controlled Vital Signs Assessment: post-procedure vital signs reviewed and stable Respiratory status: spontaneous breathing, nonlabored ventilation, respiratory function stable and patient connected to nasal cannula oxygen Cardiovascular status: blood pressure returned to baseline and stable Postop Assessment: no signs of nausea or vomiting Anesthetic complications: no    Last Vitals:  Filed Vitals:   11/23/15 1534 11/23/15 1544  BP: 161/78 160/77  Pulse: 82 81  Temp:    Resp: 17 16    Last Pain:  Filed Vitals:   11/23/15 1545  PainSc: [place] [facility] Samiah Ricklefs

## 2016-03-30 ENCOUNTER — Inpatient Hospital Stay
Admission: EM | Admit: 2016-03-30 | Discharge: 2016-04-03 | DRG: 369 | Disposition: A | Discharge status: Home / Self Care | Payer: PPO | Attending: Internal Medicine | Admitting: Internal Medicine

## 2016-03-30 ENCOUNTER — Emergency Department: Payer: PPO

## 2016-03-30 ENCOUNTER — Inpatient Hospital Stay (HOSPITAL_COMMUNITY)
Admit: 2016-03-30 | Discharge: 2016-03-30 | Disposition: A | Discharge status: Home / Self Care | Payer: PPO | Attending: Nurse Practitioner | Admitting: Nurse Practitioner

## 2016-03-30 ENCOUNTER — Encounter: Payer: Self-pay | Admitting: Emergency Medicine

## 2016-03-30 DIAGNOSIS — K5900 Constipation, unspecified: Secondary | ICD-10-CM | POA: Diagnosis present

## 2016-03-30 DIAGNOSIS — N390 Urinary tract infection, site not specified: Secondary | ICD-10-CM | POA: Diagnosis present

## 2016-03-30 DIAGNOSIS — Z7984 Long term (current) use of oral hypoglycemic drugs: Secondary | ICD-10-CM | POA: Diagnosis not present

## 2016-03-30 DIAGNOSIS — D649 Anemia, unspecified: Secondary | ICD-10-CM | POA: Diagnosis present

## 2016-03-30 DIAGNOSIS — Z79899 Other long term (current) drug therapy: Secondary | ICD-10-CM

## 2016-03-30 DIAGNOSIS — K226 Gastro-esophageal laceration-hemorrhage syndrome: Secondary | ICD-10-CM | POA: Diagnosis present

## 2016-03-30 DIAGNOSIS — E876 Hypokalemia: Secondary | ICD-10-CM | POA: Diagnosis present

## 2016-03-30 DIAGNOSIS — D62 Acute posthemorrhagic anemia: Secondary | ICD-10-CM | POA: Diagnosis present

## 2016-03-30 DIAGNOSIS — I481 Persistent atrial fibrillation: Secondary | ICD-10-CM | POA: Diagnosis present

## 2016-03-30 DIAGNOSIS — Z96642 Presence of left artificial hip joint: Secondary | ICD-10-CM | POA: Diagnosis present

## 2016-03-30 DIAGNOSIS — I959 Hypotension, unspecified: Secondary | ICD-10-CM | POA: Diagnosis present

## 2016-03-30 DIAGNOSIS — Z9049 Acquired absence of other specified parts of digestive tract: Secondary | ICD-10-CM | POA: Diagnosis not present

## 2016-03-30 DIAGNOSIS — Z66 Do not resuscitate: Secondary | ICD-10-CM

## 2016-03-30 DIAGNOSIS — R000 Tachycardia, unspecified: Secondary | ICD-10-CM | POA: Diagnosis present

## 2016-03-30 DIAGNOSIS — K921 Melena: Secondary | ICD-10-CM

## 2016-03-30 DIAGNOSIS — K2270 Barrett's esophagus without dysplasia: Secondary | ICD-10-CM | POA: Diagnosis present

## 2016-03-30 DIAGNOSIS — R140 Abdominal distension (gaseous): Secondary | ICD-10-CM

## 2016-03-30 DIAGNOSIS — C259 Malignant neoplasm of pancreas, unspecified: Secondary | ICD-10-CM | POA: Diagnosis present

## 2016-03-30 DIAGNOSIS — E119 Type 2 diabetes mellitus without complications: Secondary | ICD-10-CM | POA: Diagnosis present

## 2016-03-30 DIAGNOSIS — K922 Gastrointestinal hemorrhage, unspecified: Secondary | ICD-10-CM | POA: Diagnosis present

## 2016-03-30 DIAGNOSIS — Z8507 Personal history of malignant neoplasm of pancreas: Secondary | ICD-10-CM

## 2016-03-30 DIAGNOSIS — B1920 Unspecified viral hepatitis C without hepatic coma: Secondary | ICD-10-CM | POA: Diagnosis present

## 2016-03-30 DIAGNOSIS — A419 Sepsis, unspecified organism: Secondary | ICD-10-CM

## 2016-03-30 DIAGNOSIS — I10 Essential (primary) hypertension: Secondary | ICD-10-CM | POA: Diagnosis present

## 2016-03-30 DIAGNOSIS — R1084 Generalized abdominal pain: Secondary | ICD-10-CM

## 2016-03-30 DIAGNOSIS — M6281 Muscle weakness (generalized): Secondary | ICD-10-CM

## 2016-03-30 DIAGNOSIS — I480 Paroxysmal atrial fibrillation: Secondary | ICD-10-CM | POA: Insufficient documentation

## 2016-03-30 DIAGNOSIS — K920 Hematemesis: Secondary | ICD-10-CM

## 2016-03-30 DIAGNOSIS — R112 Nausea with vomiting, unspecified: Secondary | ICD-10-CM

## 2016-03-30 DIAGNOSIS — I4891 Unspecified atrial fibrillation: Secondary | ICD-10-CM

## 2016-03-30 DIAGNOSIS — R262 Difficulty in walking, not elsewhere classified: Secondary | ICD-10-CM

## 2016-03-30 HISTORY — DX: Other specified diseases of pancreas: K86.89

## 2016-03-30 HISTORY — DX: Malignant neoplasm of pancreas, unspecified: C25.9

## 2016-03-30 HISTORY — DX: Acute infarction of intestine, part and extent unspecified: K55.069

## 2016-03-30 HISTORY — DX: Obstruction of bile duct: K83.1

## 2016-03-30 HISTORY — DX: Portal vein thrombosis: I81

## 2016-03-30 LAB — LACTIC ACID, PLASMA
LACTIC ACID, VENOUS: 1.4 mmol/L (ref 0.5–1.9)
Lactic Acid, Venous: 3.4 mmol/L (ref 0.5–1.9)

## 2016-03-30 LAB — URINALYSIS COMPLETE WITH MICROSCOPIC (ARMC ONLY)
BILIRUBIN URINE: NEGATIVE
Bacteria, UA: NONE SEEN
Glucose, UA: 150 mg/dL — AB
HGB URINE DIPSTICK: NEGATIVE
Nitrite: NEGATIVE
PH: 5 (ref 5.0–8.0)
Protein, ur: NEGATIVE mg/dL
SPECIFIC GRAVITY, URINE: 1.025 (ref 1.005–1.030)

## 2016-03-30 LAB — CBC
HEMATOCRIT: 22.4 % — AB (ref 35.0–47.0)
HEMATOCRIT: 25.4 % — AB (ref 35.0–47.0)
HEMOGLOBIN: 7.7 g/dL — AB (ref 12.0–16.0)
Hemoglobin: 8.7 g/dL — ABNORMAL LOW (ref 12.0–16.0)
MCH: 31.9 pg (ref 26.0–34.0)
MCH: 31.8 pg (ref 26.0–34.0)
MCHC: 34.5 g/dL (ref 32.0–36.0)
MCHC: 34.4 g/dL (ref 32.0–36.0)
MCV: 92.5 fL (ref 80.0–100.0)
MCV: 92.4 fL (ref 80.0–100.0)
PLATELETS: 128 10*3/uL — AB (ref 150–440)
Platelets: 125 10*3/uL — ABNORMAL LOW (ref 150–440)
RBC: 2.42 MIL/uL — AB (ref 3.80–5.20)
RBC: 2.75 MIL/uL — ABNORMAL LOW (ref 3.80–5.20)
RDW: 15.7 % — ABNORMAL HIGH (ref 11.5–14.5)
RDW: 15.5 % — ABNORMAL HIGH (ref 11.5–14.5)
WBC: 6.5 10*3/uL (ref 3.6–11.0)
WBC: 7.4 10*3/uL (ref 3.6–11.0)

## 2016-03-30 LAB — CBC WITH DIFFERENTIAL/PLATELET
Basophils Absolute: 0.1 10*3/uL (ref 0–0.1)
Basophils Relative: 0 %
EOS PCT: 0 %
Eosinophils Absolute: 0 10*3/uL (ref 0–0.7)
HCT: 23.3 % — ABNORMAL LOW (ref 35.0–47.0)
HEMOGLOBIN: 7.7 g/dL — AB (ref 12.0–16.0)
LYMPHS ABS: 0.9 10*3/uL — AB (ref 1.0–3.6)
LYMPHS PCT: 7 %
MCH: 32 pg (ref 26.0–34.0)
MCHC: 33 g/dL (ref 32.0–36.0)
MCV: 97.2 fL (ref 80.0–100.0)
Monocytes Absolute: 0.8 10*3/uL (ref 0.2–0.9)
Monocytes Relative: 7 %
NEUTROS PCT: 86 %
Neutro Abs: 10.7 10*3/uL — ABNORMAL HIGH (ref 1.4–6.5)
Platelets: 194 10*3/uL (ref 150–440)
RBC: 2.39 MIL/uL — AB (ref 3.80–5.20)
RDW: 15 % — ABNORMAL HIGH (ref 11.5–14.5)
WBC: 12.5 10*3/uL — AB (ref 3.6–11.0)

## 2016-03-30 LAB — COMPREHENSIVE METABOLIC PANEL
ALBUMIN: 2.8 g/dL — AB (ref 3.5–5.0)
ALT: 21 U/L (ref 14–54)
AST: 27 U/L (ref 15–41)
Alkaline Phosphatase: 34 U/L — ABNORMAL LOW (ref 38–126)
Anion gap: 10 (ref 5–15)
BUN: 44 mg/dL — ABNORMAL HIGH (ref 6–20)
CHLORIDE: 112 mmol/L — AB (ref 101–111)
CO2: 21 mmol/L — AB (ref 22–32)
CREATININE: 0.5 mg/dL (ref 0.44–1.00)
Calcium: 8.3 mg/dL — ABNORMAL LOW (ref 8.9–10.3)
GFR calc non Af Amer: >60 mL/min (ref >60)
Glucose, Bld: 287 mg/dL — ABNORMAL HIGH (ref 65–99)
Potassium: 3.8 mmol/L (ref 3.5–5.1)
SODIUM: 143 mmol/L (ref 135–145)
Total Bilirubin: 1.2 mg/dL (ref 0.3–1.2)
Total Protein: 5.5 g/dL — ABNORMAL LOW (ref 6.5–8.1)

## 2016-03-30 LAB — MRSA PCR SCREENING: MRSA BY PCR: NEGATIVE

## 2016-03-30 LAB — ABO/RH: ABO/RH(D): A POS

## 2016-03-30 LAB — MAGNESIUM: Magnesium: 1.5 mg/dL — ABNORMAL LOW (ref 1.7–2.4)

## 2016-03-30 LAB — PREPARE RBC (CROSSMATCH)

## 2016-03-30 LAB — TROPONIN I: Troponin I: <0.03 ng/mL (ref <0.03)

## 2016-03-30 LAB — AMMONIA: Ammonia: 37 umol/L — ABNORMAL HIGH (ref 9–35)

## 2016-03-30 LAB — ECHOCARDIOGRAM COMPLETE
HEIGHTINCHES: 62 in
WEIGHTICAEL: 2151.69 [oz_av]

## 2016-03-30 LAB — LIPASE, BLOOD: Lipase: 14 U/L (ref 11–51)

## 2016-03-30 LAB — PROTIME-INR
INR: 1.26
Prothrombin Time: 15.9 seconds — ABNORMAL HIGH (ref 11.4–15.2)

## 2016-03-30 LAB — GLUCOSE, CAPILLARY: GLUCOSE-CAPILLARY: 294 mg/dL — AB (ref 65–99)

## 2016-03-30 LAB — TSH: TSH: 0.35 u[IU]/mL (ref 0.350–4.500)

## 2016-03-30 MED ORDER — ONDANSETRON HCL 4 MG/2ML IJ SOLN
4.0000 mg | INTRAMUSCULAR | Status: AC
  Administered 2016-03-30: 4 mg via INTRAVENOUS
  Filled 2016-03-30: qty 2

## 2016-03-30 MED ORDER — SODIUM CHLORIDE 0.9 % IV SOLN
10.0000 mL/h | Freq: Once | INTRAVENOUS | Status: AC
  Administered 2016-03-30: 10 mL/h via INTRAVENOUS

## 2016-03-30 MED ORDER — PIPERACILLIN-TAZOBACTAM 3.375 G IVPB 30 MIN
3.3750 g | Freq: Once | INTRAVENOUS | Status: AC
  Administered 2016-03-30: 3.375 g via INTRAVENOUS
  Filled 2016-03-30: qty 50

## 2016-03-30 MED ORDER — MORPHINE SULFATE (PF) 2 MG/ML IV SOLN
2.0000 mg | Freq: Once | INTRAVENOUS | Status: AC
  Administered 2016-03-30: 2 mg via INTRAVENOUS
  Filled 2016-03-30: qty 1

## 2016-03-30 MED ORDER — VANCOMYCIN HCL IN DEXTROSE 750-5 MG/150ML-% IV SOLN
750.0000 mg | INTRAVENOUS | Status: DC
  Filled 2016-03-30: qty 150

## 2016-03-30 MED ORDER — SODIUM CHLORIDE 0.9 % IV SOLN: Freq: Once | INTRAVENOUS | Status: AC

## 2016-03-30 MED ORDER — SODIUM CHLORIDE 0.9 % IV BOLUS (SEPSIS)
1000.0000 mL | INTRAVENOUS | Status: AC
  Administered 2016-03-30: 1000 mL via INTRAVENOUS

## 2016-03-30 MED ORDER — SODIUM CHLORIDE 0.9% FLUSH
3.0000 mL | Freq: Two times a day (BID) | INTRAVENOUS | Status: DC
  Administered 2016-03-30 – 2016-04-02 (×5): 3 mL via INTRAVENOUS

## 2016-03-30 MED ORDER — PIPERACILLIN-TAZOBACTAM 3.375 G IVPB: 3.3750 g | Freq: Three times a day (TID) | INTRAVENOUS | Status: DC

## 2016-03-30 MED ORDER — ONDANSETRON HCL 4 MG/2ML IJ SOLN
4.0000 mg | Freq: Four times a day (QID) | INTRAMUSCULAR | Status: DC | PRN
  Administered 2016-04-01: 02:00:00 4 mg via INTRAVENOUS
  Filled 2016-03-30: qty 2

## 2016-03-30 MED ORDER — PIPERACILLIN-TAZOBACTAM 3.375 G IVPB 30 MIN: 3.3750 g | Freq: Once | INTRAVENOUS | Status: DC

## 2016-03-30 MED ORDER — ONDANSETRON HCL 4 MG PO TABS: 4.0000 mg | ORAL_TABLET | Freq: Four times a day (QID) | ORAL | Status: DC | PRN

## 2016-03-30 MED ORDER — ACETAMINOPHEN 650 MG RE SUPP: 650.0000 mg | Freq: Four times a day (QID) | RECTAL | Status: DC | PRN

## 2016-03-30 MED ORDER — VANCOMYCIN HCL IN DEXTROSE 750-5 MG/150ML-% IV SOLN
750.0000 mg | INTRAVENOUS | Status: DC
  Administered 2016-03-30: 750 mg via INTRAVENOUS
  Filled 2016-03-30 (×2): qty 150

## 2016-03-30 MED ORDER — FAMOTIDINE IN NACL 20-0.9 MG/50ML-% IV SOLN
20.0000 mg | Freq: Two times a day (BID) | INTRAVENOUS | Status: DC
  Administered 2016-03-30 – 2016-04-03 (×9): 20 mg via INTRAVENOUS
  Filled 2016-03-30 (×10): qty 50

## 2016-03-30 MED ORDER — VANCOMYCIN HCL IN DEXTROSE 1-5 GM/200ML-% IV SOLN
1000.0000 mg | Freq: Once | INTRAVENOUS | Status: AC
  Administered 2016-03-30: 1000 mg via INTRAVENOUS
  Filled 2016-03-30: qty 200

## 2016-03-30 MED ORDER — PIPERACILLIN-TAZOBACTAM 3.375 G IVPB
3.3750 g | Freq: Three times a day (TID) | INTRAVENOUS | Status: DC
Start: 2016-03-30 — End: 2016-03-31
  Administered 2016-03-30 – 2016-03-31 (×3): 3.375 g via INTRAVENOUS
  Filled 2016-03-30 (×3): qty 50

## 2016-03-30 MED ORDER — SODIUM CHLORIDE 0.9 % IV SOLN
INTRAVENOUS | Status: DC
  Administered 2016-03-30 – 2016-04-01 (×5): via INTRAVENOUS

## 2016-03-30 MED ORDER — MORPHINE SULFATE (PF) 2 MG/ML IV SOLN
2.0000 mg | INTRAVENOUS | Status: DC | PRN
  Administered 2016-04-01: 2 mg via INTRAVENOUS
  Filled 2016-03-30: qty 1

## 2016-03-30 MED ORDER — VANCOMYCIN HCL IN DEXTROSE 1-5 GM/200ML-% IV SOLN: 1000.0000 mg | Freq: Once | INTRAVENOUS | Status: DC

## 2016-03-30 MED ORDER — AMIODARONE HCL 200 MG PO TABS
400.0000 mg | ORAL_TABLET | Freq: Two times a day (BID) | ORAL | Status: DC
  Administered 2016-03-30 – 2016-04-03 (×8): 400 mg via ORAL
  Filled 2016-03-30 (×9): qty 2

## 2016-03-30 MED ORDER — ACETAMINOPHEN 325 MG PO TABS
650.0000 mg | ORAL_TABLET | Freq: Four times a day (QID) | ORAL | Status: DC | PRN
  Administered 2016-03-31: 18:00:00 650 mg via ORAL
  Filled 2016-03-30: qty 2

## 2016-03-30 MED ORDER — SODIUM CHLORIDE 0.9 % IV BOLUS (SEPSIS)
1000.0000 mL | Freq: Once | INTRAVENOUS | Status: AC
  Administered 2016-03-30: 1000 mL via INTRAVENOUS

## 2016-03-30 NOTE — ED Notes (Signed)
Pts temperature increased from 98.4 to 99.4 after blood started. MD notified. Told to recheck temperature in 30 mins. Nurse Hinton Dyer, RN in CCU notified.

## 2016-03-30 NOTE — H&P (Signed)
[facility]    MR#:  [id number]  DATE OF BIRTH:  Mar 07, 1929  DATE OF ADMISSION:  03/30/2016  PRIMARY CARE PHYSICIAN: Tracie Harrier, MD   REQUESTING/REFERRING PHYSICIAN:   CHIEF COMPLAINT:   Chief Complaint  Patient presents with  . Nausea  . Emesis    HISTORY OF PRESENT ILLNESS: [name]  is a 82 y.o. female with a known history of Pancreatic mass, mesenteric vein thrombosis, portal vein thrombosis, hepatitis C, type 2 diabetes mellitus presented to the emergency room with abdominal pain nausea and vomiting. Patient lives with her daughter at home and is currently DO NOT RESUSCITATE and DO NOT INTUBATE. Patient has this pancreatic mass which has been worked up with gastroenterology as outpatient and she recently had an ERCP done in June 2017. She also had a biliary stent placed. Patient's daughter is currently moving to [place] from [place] and is planning to take the patient to and she gets settled in [place]. Patient complains of abdominal discomfort which is aching in nature 5 out of 10 on a scale of 1-10. She has distended abdomen secondary to pancreatic mass. Patient also complains of blood in the stool. She has weakness and fatigue. No complaints of any chest pain. No history of any shortness of breath. Evaluation in the emergency room patient was tachycardic and I laboratory workup showed elevated lactate level. Code sepsis was called she was given IV fluids 2 L and was given broad-spectrum IV antibiotics. Hospitalist service was consulted for further care of the patient.  PAST MEDICAL HISTORY:   Past Medical History:  Diagnosis Date  . Diabetes mellitus, type 2 ([place]) 11/01/2014  . Femur fracture ([place])   . Hepatitis C   . Pancreatic mass   . Pancreatitis   . Thrombosis of mesenteric vein     PAST SURGICAL HISTORY: Past Surgical History:  Procedure Laterality Date  .  ABDOMINAL HYSTERECTOMY    . APPENDECTOMY    . CHOLECYSTECTOMY    . ERCP N/A 08/19/2015   Procedure: ENDOSCOPIC RETROGRADE CHOLANGIOPANCREATOGRAPHY (ERCP);  Surgeon: Lucilla Lame, MD;  Location: [facility] ENDOSCOPY;  Service: Endoscopy;  Laterality: N/A;  . ERCP N/A 11/23/2015   Procedure: ENDOSCOPIC RETROGRADE CHOLANGIOPANCREATOGRAPHY (ERCP) stent exchange ;  Surgeon: Lucilla Lame, MD;  Location: [facility] ENDOSCOPY;  Service: Endoscopy;  Laterality: N/A;  . EUS N/A 11/18/2014   Procedure: ESOPHAGEAL ENDOSCOPIC ULTRASOUND (EUS) RADIAL;  Surgeon: Cora Daniels, MD;  Location: [facility] ENDOSCOPY;  Service: Endoscopy;  Laterality: N/A;  . INTRACAPSULAR CATARACT EXTRACTION Left 2005  . ORIF of left femur fracture  04/2007  . TOTAL HIP ARTHROPLASTY Left 1981    SOCIAL HISTORY:  Social History  Substance Use Topics  . Smoking status: Never Smoker  . Smokeless tobacco: Never Used  . Alcohol use 0.0 oz/week     Comment: Infrequent use; special occasion;    FAMILY HISTORY:  Family History  Problem Relation Age of Onset  . Hypertension Mother     DRUG ALLERGIES:  Allergies  Allergen Reactions  . Aspirin Other (See Comments)    Stomach problems  . Omeprazole Nausea Only    REVIEW OF SYSTEMS:   CONSTITUTIONAL: No fever,  Has weakness.  EYES: No blurred or double vision.  EARS, NOSE, AND THROAT: No tinnitus or ear pain.  RESPIRATORY: No cough, shortness of breath, wheezing or hemoptysis.  CARDIOVASCULAR: No chest pain, orthopnea, edema.  GASTROINTESTINAL: Has nausea, vomiting, abdominal pain.  No  diarrhea Has blood in stool. GENITOURINARY: No dysuria, hematuria.  ENDOCRINE: No polyuria, nocturia,  HEMATOLOGY: Has anemia SKIN: No rash or lesion. MUSCULOSKELETAL: No joint pain or arthritis.   NEUROLOGIC: No tingling, numbness, weakness.  PSYCHIATRY: No anxiety or depression.   MEDICATIONS AT HOME:  Prior to Admission medications   Medication Sig Start Date End Date Taking? Authorizing  Provider  acetaminophen (TYLENOL) 500 MG tablet Take 1,000 mg by mouth every 6 (six) hours as needed for moderate pain. Reported on 08/12/2015    Historical Provider, MD  glipiZIDE (GLUCOTROL) 5 MG tablet Take 5 mg by mouth 2 (two) times daily before a meal.    Historical Provider, MD  lipase/protease/amylase (CREON) 12000 UNITS CPEP capsule Take 36,000 Units by mouth 3 (three) times daily with meals.    Historical Provider, MD  metFORMIN (GLUCOPHAGE) 500 MG tablet Take 1 tablet by mouth daily. 10/18/14   Historical Provider, MD  potassium chloride SA (K-DUR,KLOR-CON) 20 MEQ tablet Take 20 mEq by mouth daily. 1/2 tab qd    Historical Provider, MD  ranitidine (ZANTAC) 150 MG tablet Take 150 mg by mouth 2 (two) times daily.    Historical Provider, MD      PHYSICAL EXAMINATION:   VITAL SIGNS: Blood pressure 96/71, pulse (!) 128, temperature 97.6 F (36.4 C), temperature source Oral, resp. rate 15, height 5\' 2"  (1.575 m), weight 60.3 kg (133 lb), SpO2 98 %.  GENERAL:  82 y.o.-year-old patient lying in the bed in mild distress.  EYES: Pupils equal, round, reactive to light and accommodation. No scleral icterus. Has pallor noted. Extraocular muscles intact.  HEENT: Head atraumatic, normocephalic. Oropharynx and nasopharynx clear.  NECK:  Supple, no jugular venous distention. No thyroid enlargement, no tenderness.  LUNGS: Normal breath sounds bilaterally, no wheezing, rales,rhonchi or crepitation. No use of accessory muscles of respiration.  CARDIOVASCULAR: S1, S2 tacycardia noted. No murmurs, rubs, or gallops.  ABDOMEN: Soft, tenderness right upper quadrant, mass noted right upper quadrant. Bowel sounds present. No organomegaly or mass.  EXTREMITIES: No pedal edema, cyanosis, or clubbing.  NEUROLOGIC: Cranial nerves II through XII are intact. Muscle strength 5/5 in all extremities. Sensation intact. Gait not checked.  PSYCHIATRIC: The patient is alert and oriented x 2  SKIN: No obvious rash, lesion,  or ulcer.   LABORATORY PANEL:   CBC  Recent Labs Lab 03/30/16 0313  WBC 12.5*  HGB 7.7*  HCT 23.3*  PLT 194  MCV 97.2  MCH 32.0  MCHC 33.0  RDW 15.0*  LYMPHSABS 0.9*  MONOABS 0.8  EOSABS 0.0  BASOSABS 0.1   ------------------------------------------------------------------------------------------------------------------  Chemistries   Recent Labs Lab 03/30/16 0313  NA 143  K 3.8  CL 112*  CO2 21*  GLUCOSE 287*  BUN 44*  CREATININE 0.50  CALCIUM 8.3*  MG 1.5*  AST 27  ALT 21  ALKPHOS 34*  BILITOT 1.2   ------------------------------------------------------------------------------------------------------------------ estimated creatinine clearance is 44.8 mL/min (by C-G formula based on SCr of 0.5 mg/dL). ------------------------------------------------------------------------------------------------------------------ No results for input(s): TSH, T4TOTAL, T3FREE, THYROIDAB in the last 72 hours.  Invalid input(s): FREET3   Coagulation profile  Recent Labs Lab 03/30/16 0313  INR 1.26   ------------------------------------------------------------------------------------------------------------------- No results for input(s): DDIMER in the last 72 hours. -------------------------------------------------------------------------------------------------------------------  Cardiac Enzymes  Recent Labs Lab 03/30/16 0313  TROPONINI <0.03   ------------------------------------------------------------------------------------------------------------------ Invalid input(s): POCBNP  ---------------------------------------------------------------------------------------------------------------  Urinalysis    Component Value Date/Time   COLORURINE YELLOW (A) 03/30/2016 0415   APPEARANCEUR CLEAR (A) 03/30/2016 0415  LABSPEC 1.025 03/30/2016 0415   PHURINE 5.0 03/30/2016 0415   GLUCOSEU 150 (A) 03/30/2016 0415   HGBUR NEGATIVE 03/30/2016 0415    BILIRUBINUR NEGATIVE 03/30/2016 0415   KETONESUR 1+ (A) 03/30/2016 0415   PROTEINUR NEGATIVE 03/30/2016 0415   NITRITE NEGATIVE 03/30/2016 0415   LEUKOCYTESUR TRACE (A) 03/30/2016 0415     RADIOLOGY: Dg Chest Port 1 View  Result Date: 03/30/2016 CLINICAL DATA:  82 y/o F; nausea and vomiting accompanied by abdominal pain. Stage IV pancreatic cancer. Hypoxia. EXAM: PORTABLE CHEST 1 VIEW COMPARISON:  05/02/2007 chest radiographs FINDINGS: Stable cardiomegaly given projection and technique. No focal consolidation. No pneumothorax or pleural effusion. No pulmonary edema aortic atherosclerosis with arch calcification. No acute osseous abnormality is evident. Mild dextrocurvature of the thoracic spine. IMPRESSION: No acute cardiopulmonary process identified. Electronically Signed   By: Kristine Garbe M.D.   On: 03/30/2016 05:00    EKG: Orders placed or performed during the hospital encounter of 03/30/16  . EKG 12-Lead  . EKG 12-Lead  . EKG 12-Lead  . EKG 12-Lead    IMPRESSION AND PLAN: 82 year old elderly female patient with history of pancreatic mass, numbness and tingling thrombosis, portal vein thrombosis, hepatitis C presented to the emergency room with abdominal pain, nausea and vomiting and rectal bleed. Admitting diagnosis 1. Acute gastrointestinal bleeding 2. Symptomatic anemia 3. Sepsis 4. Leukocytosis 5. Pancreatic mass most probably malignancy Treatment plan Admit patient to stepdown unit IV fluid hydration IV vancomycin and IV Zosyn antibiotics Pain management Transfuse 1 unit of packed red blood cells intravenously Gastroenterology consultation DVT prophylaxis sequential compression devices to lower extremities Follow-up hemoglobin and hematocrit Follow-up WBC count GI prophylaxis with IV Pepcid High risk of morbidity and mortality. Supportive care.  All the records are reviewed and case discussed with ED provider. Management plans discussed with the  patient, family and they are in agreement.  CODE STATUS:DNR    Code Status Orders        Start     Ordered   03/30/16 0557  Do not attempt resuscitation (DNR)  Continuous    Question Answer Comment  In the event of cardiac or respiratory ARREST Do not call a "code blue"   In the event of cardiac or respiratory ARREST Do not perform Intubation, CPR, defibrillation or ACLS   In the event of cardiac or respiratory ARREST Use medication by any route, position, wound care, and other measures to relive pain and suffering. May use oxygen, suction and manual treatment of airway obstruction as needed for comfort.      03/30/16 0559    Code Status History    Date Active Date Inactive Code Status Order ID Comments User Context   03/30/2016  3:59 AM 03/30/2016  5:59 AM DNR [id number]  Hinda Kehr, MD ED   12/26/2014  2:46 PM 12/29/2014  8:21 PM Full Code [id number]  Idelle Crouch, MD Inpatient   10/19/2014  5:49 PM 10/25/2014  7:34 PM DNR [id number]  Bettey Costa, MD Inpatient    Advance Directive Documentation   Flowsheet Row Most Recent Value  Type of Advance Directive  Out of facility DNR (pink MOST or yellow form)  Pre-existing out of facility DNR order (yellow form or pink MOST form)  Yellow form placed in chart (order not valid for inpatient use)  "MOST" Form in Place?  No data       TOTAL TIME TAKING CARE OF THIS PATIENT: 54 minutes.    Saundra Shelling M.D on 03/30/2016 at  6:02 AM  Between 7am to 6pm - Pager - [phone]  After 6pm go to [email or web address] - password [email or web address] [facility]  Office  [phone]  CC: Primary care physician; Tracie Harrier, MD

## 2016-03-30 NOTE — Progress Notes (Signed)
*  PRELIMINARY RESULTS* Echocardiogram 2D Echocardiogram has been performed.  Sherrie Sport 03/30/2016, 3:29 PM

## 2016-03-30 NOTE — Progress Notes (Signed)
Pharmacy Antibiotic Note  [name] is a 82 y.o. female admitted on 03/30/2016 with sepsis.  Pharmacy has been consulted for Zosyn and vancomycin dosing.  Plan: 1. Zosyn 3.375 gm IV Q8H EI 2. Vancomycin 1 gm IV x 1 in ED followed in approximately 14 hours (stacked dosing) by vancomycin 750 mg IV Q18H, predicted trough 18 mcg/mL. Pharmacy will continue to follow and adjust as needed to maintain trough 15 to 20 mcg/mL.   Vd 37.7 L, Ke 0.041 hr-1, T1/2 16.8 hr  Height: 5\' 2"  (157.5 cm) Weight: 133 lb (60.3 kg) IBW/kg (Calculated) : 50.1  Temp (24hrs), Avg:97.6 F (36.4 C), Min:97.6 F (36.4 C), Max:97.6 F (36.4 C)   Recent Labs Lab 03/30/16 0313  WBC 12.5*  CREATININE 0.50  LATICACIDVEN 3.4*    Estimated Creatinine Clearance: 44.8 mL/min (by C-G formula based on SCr of 0.5 mg/dL).    Allergies  Allergen Reactions  . Aspirin Other (See Comments)    Stomach problems  . Omeprazole Nausea Only    Thank you for allowing pharmacy to be a part of this patient's care.  Laural Benes, Pharm.D., BCPS Clinical Pharmacist 03/30/2016 4:11 AM

## 2016-03-30 NOTE — Consult Note (Signed)
Cardiology Consult    Patient ID: [name] MRN: [id number], DOB/AGE: Apr 13, 1929   Admit date: 03/30/2016 Date of Consult: 03/30/2016  Primary Physician: Tracie Harrier, MD Primary Cardiologist: New - seen by Johnny Bridge, MD  Requesting Provider: Doylene Canning  Patient Profile    82 y/o ? with a h/o pancreatic mass complicated by common bile duct stricture req stenting, portal/splenic/mesenteric vein thrombosis prev on lovenox, and DM, who was admitted 10/12 with a 3-5 day h/o progressive weakness, dark stools, anemia, possible sepsis, and AFib w/ RVR.  Past Medical History   Past Medical History:  Diagnosis Date  . Common bile duct stricture    a. in setting of pancreatic mass s/p cbd stenting.  . Diabetes mellitus, type 2 ([place]) 11/01/2014  . Femur fracture ([place])   . Hepatitis C   . Pancreatic mass    a. 11/2014 U/S: 3.7x2.1 cm irregular hypoechoic mass in genu of pancreas. Cytology & FNA revealed scan fragments of mucinous epithelium w/ minimal atypia. Endoscopic staging suggested a T2N0 lesion-->pt decline Bx.  . Pancreatitis   . Thrombosis of main, R, & L portal veins, splenic vein, and superior mesenteric vein    a. 10/2014 noted on MRI-->previously on lovenox-->D/C'd 12/2015 2/2 L thigh hematoma.    Past Surgical History:  Procedure Laterality Date  . ABDOMINAL HYSTERECTOMY    . APPENDECTOMY    . CHOLECYSTECTOMY    . ERCP N/A 08/19/2015   Procedure: ENDOSCOPIC RETROGRADE CHOLANGIOPANCREATOGRAPHY (ERCP);  Surgeon: Lucilla Lame, MD;  Location: [facility] ENDOSCOPY;  Service: Endoscopy;  Laterality: N/A;  . ERCP N/A 11/23/2015   Procedure: ENDOSCOPIC RETROGRADE CHOLANGIOPANCREATOGRAPHY (ERCP) stent exchange ;  Surgeon: Lucilla Lame, MD;  Location: [facility] ENDOSCOPY;  Service: Endoscopy;  Laterality: N/A;  . EUS N/A 11/18/2014   Procedure: ESOPHAGEAL ENDOSCOPIC ULTRASOUND (EUS) RADIAL;  Surgeon: Cora Daniels, MD;  Location: [facility] ENDOSCOPY;  Service: Endoscopy;  Laterality: N/A;   . INTRACAPSULAR CATARACT EXTRACTION Left 2005  . ORIF of left femur fracture  04/2007  . TOTAL HIP ARTHROPLASTY Left 1981     Allergies  Allergies  Allergen Reactions  . Aspirin Other (See Comments)    Stomach problems  . Omeprazole Nausea Only    History of Present Illness    82 y/o ? with the above complex PMH including pancreatic mass (refused bx), common bile duct obstruction s/p stenting, portal/splenic/mesenteric vein thrombosis previously on  lovenox, though this was d/c'd in 12/2015 2/2 a large left thigh hematoma.  She also has a h/o DM.  She lives in [place] with her dtr and was in her usoh until earlier this week when she began to experience progressive weakness with dark stools.  She was not experiencing chest pain, dyspnea, presyncope, syncope, edema, fevers, chills, or palpitations.  Symptoms progressed to include nausea, vomiting, and abd pain, and she presented to the [facility] ED early on 10/13 where she was hypoxic (80's to low 90's) and tachycardic in Afib with rates in the 140's.  BP's were soft - 90's to low 100's. H/H low @ 7.7/23.3.  She was admitted to ICU and is currently receiving PRBC's.  She remains in AFib in the 120's.  She is currently asymptomatic.  Inpatient Medications    . sodium chloride  10 mL/hr Intravenous Once  . famotidine (PEPCID) IV  20 mg Intravenous Q12H  . piperacillin-tazobactam (ZOSYN)  IV  3.375 g Intravenous Q8H  . sodium chloride flush  3 mL Intravenous Q12H  . vancomycin  750 mg Intravenous  Q18H    Family History    Family History  Problem Relation Age of Onset  . Hypertension Mother     Social History    Social History   Social History  . Marital status: Divorced    Spouse name: N/A  . Number of children: N/A  . Years of education: N/A   Occupational History  . retired    Social History Main Topics  . Smoking status: Never Smoker  . Smokeless tobacco: Never Used  . Alcohol use 0.0 oz/week     Comment: Infrequent use;  special occasion;  . Drug use: No  . Sexual activity: No   Other Topics Concern  . Not on file   Social History Narrative   Originally from [place]. Currently lives in [place] with dtr.  They were planning to move to [place] on 03/31/16.     Review of Systems    General:  +++ generalized malaise/wkns/anorexia.  No chills, fever, night sweats or weight changes.  Cardiovascular:  No chest pain, dyspnea on exertion, edema, orthopnea, palpitations, paroxysmal nocturnal dyspnea. Dermatological: No rash, lesions/masses Respiratory: No cough, dyspnea Urologic: No hematuria, dysuria Abdominal:   +++ abd pain, nausea, & vomiting.  +++ dark stools x 3-5 days.  No diarrhea, bright red blood per rectum, or hematemesis Neurologic:  No visual changes, +++ generalized wkns as above.  No changes in mental status. All other systems reviewed and are otherwise negative except as noted above.  Physical Exam    Blood pressure 102/66, pulse (!) 122, temperature 98.1 F (36.7 C), temperature source Oral, resp. rate 19, height 5\' 2"  (1.575 m), weight 134 lb 7.7 oz (61 kg), SpO2 99 %.  General: Pleasant, NAD Psych: Normal affect. Neuro: Alert and oriented X 3. Moves all extremities spontaneously. HEENT: Normal  Neck: Supple without bruits or JVD. Lungs:  Resp regular and unlabored, CTA. Heart: IR, IR, tachy,  no s3, s4, or murmurs. Abdomen: semi-firm and protuberant in setting of pancreatic mass.  Diffusely tender. BS hypoactive, + x 4.  Extremities: No clubbing, cyanosis or edema. DP/PT/Radials 2+ and equal bilaterally.  Labs     Recent Labs  03/30/16 0313  TROPONINI <0.03   Lab Results  Component Value Date   WBC 12.5 (H) 03/30/2016   HGB 7.7 (L) 03/30/2016   HCT 23.3 (L) 03/30/2016   MCV 97.2 03/30/2016   PLT 194 03/30/2016     Recent Labs Lab 03/30/16 0313  NA 143  K 3.8  CL 112*  CO2 21*  BUN 44*  CREATININE 0.50  CALCIUM 8.3*  PROT 5.5*  BILITOT 1.2  ALKPHOS 34*  ALT 21    AST 27  GLUCOSE 287*   Lab Results  Component Value Date   CHOL 144 10/18/2014   HDL 37 10/18/2014   LDLCALC 86 10/18/2014   TRIG 107 10/18/2014     Radiology Studies    Dg Chest Port 1 View  Result Date: 03/30/2016 CLINICAL DATA:  82 y/o F; nausea and vomiting accompanied by abdominal pain. Stage IV pancreatic cancer. Hypoxia. EXAM: PORTABLE CHEST 1 VIEW COMPARISON:  05/02/2007 chest radiographs FINDINGS: Stable cardiomegaly given projection and technique. No focal consolidation. No pneumothorax or pleural effusion. No pulmonary edema aortic atherosclerosis with arch calcification. No acute osseous abnormality is evident. Mild dextrocurvature of the thoracic spine. IMPRESSION: No acute cardiopulmonary process identified. Electronically Signed   By: Kristine Garbe M.D.   On: 03/30/2016 05:00    ECG & Cardiac Imaging  Afib, 162, LAD, LAFB, delayed R progression.  Assessment & Plan    1.  Acute GIB/Acute Blood Loss Anemia:  Pt admitted 10/13 with complaints of weakness and dark stools over the past 3-4 days followed by increasing abd discomfort, nausea, and vomiting.  H/H 7.7/23.3 on admission - down from 11.9/36 in February.  She is currently receiving PRBC's per IM.  2.  Sepsis:  WBC elevated on admission with lactate of 3.4.  BPs soft in setting of GIB and rapid AFib.  Abx per IM.  3.  Afib RVR:  Likely driven by [id number].  Duration unknown.  Currently asymptomatic.  Rates in 120's. BP soft.  Add PO amio 400 bid as BP likely won't tolerate attempts at rate control with either  blocker or dilt.  Further, with chronic illness, Afib would be likely to occur again in the future. Check echo, tsh.  CHA2DS2VASc = 4 but in setting of active GIB and prior spontaneous L thigh hematoma when on lovenox, she is a poor anticoagulation candidate.  4.  Pancreatic Mass:  Conservatively managed per pts wishes per outpt notes.  She has refused bx in the past.  5. DMII:  Hyperglycemic this  AM.  Per IM.  Signed, Murray Hodgkins, NP 03/30/2016, 10:01 AM

## 2016-03-30 NOTE — Progress Notes (Signed)
Spoke with daughter [name] around 11:00 today. Got permission from patient to update her.  [name] was very anxious and times difficult to converse with. She is under a lot of stress and cant continue to take care of pt anymore. She feels that hospice may be the best thing for the patient because she believes that the cancer is getting worse and that the patient has been in significant decline for several weeks to the point of no longer being able to walk or toilet herself. [name] states that pt has states she is ready to pass away and be with God. [name] states that she the hospice coordinator and RN were in their home yesterday and that she talked with [name] today. [name] stated that [name] stated she would take care of everything because [name] could not. [name] states at [employer] pt would either go to hospice care or to live with grandson.  Also received calls from [facility].  Pt gave permission for me to update them.  [name] stated that [name] told EMS pt has stage 4 pancreatic cancer and they are getting hospice for pt.  [name] states that there has never been a diagnosis of cancer on pt. She and her husband live in [place] but are willing for pt to come live with them.  She states that pt has lived with [name] for many years and has concerns with dc plan.  [name] is planning for her husband (pt's son)  To make trip to [place] to help with decisions. There is no HCPOA. [name] feels like grandson could be best option.   Dr Anselm Jungling and Margreta Journey SW has been updated to the above conversations. Christina called Hospice and they stated that pt is not a patient of theirs at this time

## 2016-03-30 NOTE — ED Triage Notes (Addendum)
Pt presents to ED from home with c/o nausea and vomiting this morning accompanied by abd pain. Pt reports hx of stage 4 pancreatic cancer. Pt denies chest pain, urinary symptoms, or shortness of breath. Per EMS, pt oxygen saturation reading high 80s and low 90s. Pt alert, no increased work in breathing noted.

## 2016-03-30 NOTE — ED Provider Notes (Signed)
[facility] Emergency Department Provider Note  ____________________________________________   First MD Initiated Contact with Patient 03/30/16 0308     (approximate)  I have reviewed the triage vital signs and the nursing notes.   HISTORY  Chief Complaint Nausea and Emesis  Level V caveat: The patient's history and review of systems is limited by some apparent confusion, uncertain if there is a chronic element of dementia  HPI [name] is a 82 y.o. female with stage IV pancreatic cancer who presents by EMS for nausea, vomiting, and abdominal pain.   According to EMS this has been going on for some time and the patient's daughter insisted that EMS be called.  The patient did not want transportation to the emergency department but the daughter insisted.  The patient is DNR/DNI and reportedly the family is looking at hospice care in [place].  The entire family is supposed to be moving to [facility].  According to EMS the daughter was extremely upset and anxious and said that she could not handle the situation anymore and that we just need to take her.  The patient had vomiting on the way to the emergency department.  She states that she still has nausea and that her abdomen always hurts.  She is tender to palpation and distended.  She denies fever, chest pain, shortness of breath.  Her symptoms are severe but they have been severe for quite some time.  Nothing makes them better nor worse  Past Medical History:  Diagnosis Date  . Diabetes mellitus, type 2 ([place]) 11/01/2014  . Femur fracture ([place])   . Hepatitis C   . Pancreatitis     Patient Active Problem List   Diagnosis Date Noted  . Fitting and adjustment of gastrointestinal appliance and device   . Obstruction of bile duct   . Acquired hyperbilirubinemia   . Neoplasm of digestive system   . Acute blood loss anemia 12/26/2014  . Acute inflammation of the pancreas 11/01/2014  .  Cataract 11/01/2014  . Chronic constipation 11/01/2014  . Diabetes mellitus, type 2 ([place]) 11/01/2014  . External hemorrhoids without complication 123456  . Arthralgia of hip 11/01/2014  . Personal history of infectious and parasitic disease 11/01/2014  . Benign hypertension 11/01/2014  . Decreased potassium in the blood 11/01/2014  . Pancreas (digestive gland) works poorly 11/01/2014  . Deep vein thrombosis of splenic vein 11/01/2014  . Acute pancreatitis   . Portal vein thrombosis   . Nausea with vomiting   . Abdominal pain, epigastric   . Pancreatic mass 10/19/2014    Past Surgical History:  Procedure Laterality Date  . ABDOMINAL HYSTERECTOMY    . APPENDECTOMY    . CHOLECYSTECTOMY    . ERCP N/A 08/19/2015   Procedure: ENDOSCOPIC RETROGRADE CHOLANGIOPANCREATOGRAPHY (ERCP);  Surgeon: Lucilla Lame, MD;  Location: [facility] ENDOSCOPY;  Service: Endoscopy;  Laterality: N/A;  . ERCP N/A 11/23/2015   Procedure: ENDOSCOPIC RETROGRADE CHOLANGIOPANCREATOGRAPHY (ERCP) stent exchange ;  Surgeon: Lucilla Lame, MD;  Location: [facility] ENDOSCOPY;  Service: Endoscopy;  Laterality: N/A;  . EUS N/A 11/18/2014   Procedure: ESOPHAGEAL ENDOSCOPIC ULTRASOUND (EUS) RADIAL;  Surgeon: Cora Daniels, MD;  Location: [facility] ENDOSCOPY;  Service: Endoscopy;  Laterality: N/A;  . INTRACAPSULAR CATARACT EXTRACTION Left 2005  . ORIF of left femur fracture  04/2007  . TOTAL HIP ARTHROPLASTY Left 1981    Prior to Admission medications   Medication Sig Start Date End Date Taking? Authorizing Provider  acetaminophen (TYLENOL) 500 MG tablet  Take 1,000 mg by mouth every 6 (six) hours as needed for moderate pain. Reported on 08/12/2015    Historical Provider, MD  glipiZIDE (GLUCOTROL) 5 MG tablet Take 5 mg by mouth 2 (two) times daily before a meal.    Historical Provider, MD  lipase/protease/amylase (CREON) 12000 UNITS CPEP capsule Take 36,000 Units by mouth 3 (three) times daily with meals.    Historical Provider, MD    metFORMIN (GLUCOPHAGE) 500 MG tablet Take 1 tablet by mouth daily. 10/18/14   Historical Provider, MD  potassium chloride SA (K-DUR,KLOR-CON) 20 MEQ tablet Take 20 mEq by mouth daily. 1/2 tab qd    Historical Provider, MD  ranitidine (ZANTAC) 150 MG tablet Take 150 mg by mouth 2 (two) times daily.    Historical Provider, MD    Allergies Aspirin and Omeprazole  Family History  Problem Relation Age of Onset  . Hypertension Mother     Social History Social History  Substance Use Topics  . Smoking status: Never Smoker  . Smokeless tobacco: Never Used  . Alcohol use 0.0 oz/week     Comment: Infrequent use; special occasion;    Review of Systems Constitutional: No fever/chills.  Chronic malaise and weakness Eyes: No visual changes. ENT: No sore throat. Cardiovascular: Denies chest pain. Respiratory: Denies shortness of breath. Gastrointestinal: +abdominal pain.  +N/V.  No diarrhea.  No constipation.  Melena according to electronic medical record Genitourinary: Negative for dysuria. Musculoskeletal: Negative for back pain. Skin: Negative for rash. Neurological: Negative for headaches, focal weakness or numbness.  10-point ROS otherwise negative.  ____________________________________________   PHYSICAL EXAM:  VITAL SIGNS: ED Triage Vitals  Enc Vitals Group     BP 03/30/16 0311 97/67     Pulse Rate 03/30/16 0311 (!) 138     Resp 03/30/16 0311 20     Temp --      Temp Source 03/30/16 0311 Oral     SpO2 03/30/16 0311 100 %     Weight 03/30/16 0312 133 lb (60.3 kg)     Height 03/30/16 0312 5\' 2"  (1.575 m)     Head Circumference --      Peak Flow --      Pain Score --      Pain Loc --      Pain Edu? --      Excl. in [place]? --     Constitutional: Alert and oriented. Elderly with the appearance of chronic illness but is not in acute distress as long as she remains still. Eyes: Conjunctivae are normal. PERRL. EOMI. Head: Atraumatic. Nose: No  congestion/rhinnorhea. Mouth/Throat: Mucous membranes are moist.  Oropharynx non-erythematous. Neck: No stridor.  No meningeal signs.   Cardiovascular: Normal rate, regular rhythm. Good peripheral circulation. Grossly normal heart sounds. Respiratory: Normal respiratory effort.  No retractions. Lungs CTAB. Gastrointestinal: Soft and nontender. No distention.  Rectal:  Gross melena externally. Performed digital exam with chaperone present, +melena, strongly heme-positive Musculoskeletal: No lower extremity tenderness nor edema. No gross deformities of extremities. Neurologic:  Normal speech and language. No gross focal neurologic deficits are appreciated.  Skin:  Skin is warm, dry and intact. No rash noted. Psychiatric: Speech is normal but the patient does not know much about her medical history or conditions and seems somewhat confused  ____________________________________________   LABS (all labs ordered are listed, but only abnormal results are displayed)  Labs Reviewed  LACTIC ACID, PLASMA - Abnormal; Notable for the following:       Result Value  Lactic Acid, Venous 3.4 (*)    All other components within normal limits  COMPREHENSIVE METABOLIC PANEL - Abnormal; Notable for the following:    Chloride 112 (*)    CO2 21 (*)    Glucose, Bld 287 (*)    BUN 44 (*)    Calcium 8.3 (*)    Total Protein 5.5 (*)    Albumin 2.8 (*)    Alkaline Phosphatase 34 (*)    All other components within normal limits  CBC WITH DIFFERENTIAL/PLATELET - Abnormal; Notable for the following:    WBC 12.5 (*)    RBC 2.39 (*)    Hemoglobin 7.7 (*)    HCT 23.3 (*)    RDW 15.0 (*)    Neutro Abs 10.7 (*)    Lymphs Abs 0.9 (*)    All other components within normal limits  MAGNESIUM - Abnormal; Notable for the following:    Magnesium 1.5 (*)    All other components within normal limits  PROTIME-INR - Abnormal; Notable for the following:    Prothrombin Time 15.9 (*)    All other components within  normal limits  URINALYSIS COMPLETEWITH MICROSCOPIC ([facility] ONLY) - Abnormal; Notable for the following:    Color, Urine YELLOW (*)    APPearance CLEAR (*)    Glucose, UA 150 (*)    Ketones, ur 1+ (*)    Leukocytes, UA TRACE (*)    Squamous Epithelial / LPF 0-5 (*)    All other components within normal limits  CULTURE, BLOOD (ROUTINE X 2)  CULTURE, BLOOD (ROUTINE X 2)  URINE CULTURE  LIPASE, BLOOD  TROPONIN I  LACTIC ACID, PLASMA  AMMONIA  TYPE AND SCREEN  PREPARE RBC (CROSSMATCH)   ____________________________________________  EKG  ED ECG REPORT I, Aleysia Oltmann, the attending physician, personally viewed and interpreted this ECG.  Date: 03/30/2016 EKG Time: 3:11 AM Rate: 162 Rhythm: Atrial fibrillation with rapid ventricular rate QRS Axis: Left axis deviation Intervals: Abnormal today. ST/T Wave abnormalities: Non-specific ST segment / T-wave changes, but no evidence of acute ischemia. Conduction Disturbances: none  ____________________________________________  RADIOLOGY   Dg Chest Port 1 View  Result Date: 03/30/2016 CLINICAL DATA:  82 y/o F; nausea and vomiting accompanied by abdominal pain. Stage IV pancreatic cancer. Hypoxia. EXAM: PORTABLE CHEST 1 VIEW COMPARISON:  05/02/2007 chest radiographs FINDINGS: Stable cardiomegaly given projection and technique. No focal consolidation. No pneumothorax or pleural effusion. No pulmonary edema aortic atherosclerosis with arch calcification. No acute osseous abnormality is evident. Mild dextrocurvature of the thoracic spine. IMPRESSION: No acute cardiopulmonary process identified. Electronically Signed   By: Kristine Garbe M.D.   On: 03/30/2016 05:00    ____________________________________________   PROCEDURES  Procedure(s) performed:   .Critical Care Performed by: Hinda Kehr Authorized by: Hinda Kehr   Critical care provider statement:    Critical care time (minutes):  45   Critical care time was  exclusive of:  Separately billable procedures and treating other patients   Critical care was necessary to treat or prevent imminent or life-threatening deterioration of the following conditions:  Sepsis and metabolic crisis   Critical care was time spent personally by me on the following activities:  Development of treatment plan with patient or surrogate, discussions with consultants, evaluation of patient's response to treatment, examination of patient, obtaining history from patient or surrogate, ordering and performing treatments and interventions, ordering and review of laboratory studies, ordering and review of radiographic studies, pulse oximetry, re-evaluation of patient's condition and review of  old charts      Critical Care performed: Yes, see critical care procedure note(s) ____________________________________________   INITIAL IMPRESSION / ASSESSMENT AND PLAN / ED COURSE  Pertinent labs & imaging results that were available during my care of the patient were reviewed by me and considered in my medical decision making (see chart for details).  The situation is confusing because she is DNR/DNI apparently not currently undergoing treatment for cancer but per EMS the daughter was very upset and wanted her transported to the emergency department.  Goals are unclear at this time and family is not present at bedside.  I will start a liter of fluids and check basic labs.  Holding off on code sepsis for now, but may escalate as results become available.   Clinical Course  Comment By Time  Given the patient's end-stage pancreatic cancer, I think that her elevated lactate might be more metabolic than septic, but I will make her code sepsis given the leukocytosis, elevated lactate, and vital sign abnormalities.  The patient's daughter, as documented above, was insistent that the patient come to the emergency department, and her granddaughter has called and spoke with the nurse (with the  patient's permission) and does not seem to have a good grasp on the severity of the patient's condition and just keeps asking if she is going to live until tomorrow.  The patient seems confused and does not seem capable of making her own decisions at this time.  Given all that, I will honor the DO NOT RESUSCITATE/DO NOT RESUSCITATE but will make her code sepsis, try to make her comfortable, and anticipate admission until the situation can get sorted out. Hinda Kehr, MD 10/13 [phone]  I just had a lengthy phone conversation with the patient's daughter who was not technically the power of attorney but who provides all of her care.  She is obviously very anxious about her mother's acute symptoms which seems to be the melena, worsening weakness, persistent vomiting, and abdominal pain.  She was not able to tell me definitively what the plan of care is but finally summarized by saying "until my mother stops breathing out wants to do anything you can."  I explained that I think that any measures are temporizing and that we will not be able to address the main issue which is the pancreatic cancer, but she wants her to stay in the hospital and get whatever treatment is available.  She states that she will not be able to see her until later tomorrow because she is moving to [facility] and she confirmed that they had been in discussions with [facility] but that nothing is set up and she does not have the ability to care for at home particularly with her acute on chronic illness.  I did confirm DO NOT RESUSCITATE/DO NOT INTUBATE and we will treat her symptoms as best we can and anticipate admission. Hinda Kehr, MD 10/13 [phone]  Discussed with Dr. Estanislado Pandy (hospitalist) and Elmo Putt (nursing supervisor).  Current plan is for admission, probably to stepdown, symptom treatment, probable GI consult.   Hinda Kehr, MD 10/13 0515    ____________________________________________  FINAL CLINICAL IMPRESSION(S) / ED  DIAGNOSES  Final diagnoses:  Symptomatic anemia  Melena  Generalized abdominal pain  Intractable vomiting with nausea, unspecified vomiting type  Malignant neoplasm of pancreas, unspecified location of malignancy ([place])  DNR (do not resuscitate)     MEDICATIONS GIVEN DURING THIS VISIT:  Medications  sodium chloride 0.9 % bolus 1,000 mL (  1,000 mLs Intravenous New Bag/Given 03/30/16 0452)  piperacillin-tazobactam (ZOSYN) IVPB 3.375 g (3.375 g Intravenous New Bag/Given 03/30/16 0447)  vancomycin (VANCOCIN) IVPB 1000 mg/200 mL premix (1,000 mg Intravenous New Bag/Given 03/30/16 0451)  0.9 %  sodium chloride infusion (not administered)  sodium chloride 0.9 % bolus 1,000 mL (0 mLs Intravenous Stopped 03/30/16 0507)  ondansetron (ZOFRAN) injection 4 mg (4 mg Intravenous Given 03/30/16 0322)  morphine 2 MG/ML injection 2 mg (2 mg Intravenous Given 03/30/16 0322)     NEW OUTPATIENT MEDICATIONS STARTED DURING THIS VISIT:  New Prescriptions   No medications on file    Modified Medications   No medications on file    Discontinued Medications   No medications on file     Note:  This document was prepared using Dragon voice recognition software and may include unintentional dictation errors.    Hinda Kehr, MD 03/30/16 [phone]

## 2016-03-30 NOTE — Progress Notes (Signed)
[facility] [place]    MR#:  [id number]  DATE OF BIRTH:  1928/11/03  SUBJECTIVE:  CHIEF COMPLAINT:   Chief Complaint  Patient presents with  . Nausea  . Emesis     Came with generalized weakness and dark stool.   Receiving PRBC, and have a fib with RVR, BP is low normal.  REVIEW OF SYSTEMS:   CONSTITUTIONAL: No fever, positive for fatigue or weakness.  EYES: No blurred or double vision.  EARS, NOSE, AND THROAT: No tinnitus or ear pain.  RESPIRATORY: No cough, shortness of breath, wheezing or hemoptysis.  CARDIOVASCULAR: No chest pain, orthopnea, edema.  GASTROINTESTINAL: No nausea, vomiting, diarrhea or abdominal pain. Have dark stool. GENITOURINARY: No dysuria, hematuria.  ENDOCRINE: No polyuria, nocturia,  HEMATOLOGY: No anemia, easy bruising or bleeding SKIN: No rash or lesion. MUSCULOSKELETAL: No joint pain or arthritis.   NEUROLOGIC: No tingling, numbness, weakness.  PSYCHIATRY: No anxiety or depression.   ROS  DRUG ALLERGIES:   Allergies  Allergen Reactions  . Aspirin Other (See Comments)    Stomach problems  . Omeprazole Nausea Only    VITALS:  Blood pressure 114/68, pulse (!) 119, temperature 98.4 F (36.9 C), temperature source Oral, resp. rate 17, height 5\' 2"  (1.575 m), weight 61 kg (134 lb 7.7 oz), SpO2 100 %.  PHYSICAL EXAMINATION:   GENERAL:  82 y.o.-year-old patient lying in the bed in mild distress.  EYES: Pupils equal, round, reactive to light and accommodation. No scleral icterus. Has pallor noted. Extraocular muscles intact.  HEENT: Head atraumatic, normocephalic. Oropharynx and nasopharynx clear. Conjunctiva pale. NECK:  Supple, no jugular venous distention. No thyroid enlargement, no tenderness.  LUNGS: Normal breath sounds bilaterally, no wheezing, rales,rhonchi or crepitation. No use of accessory muscles of respiration.  CARDIOVASCULAR: S1, S2 tacycardia noted. No murmurs,  rubs, or gallops.  ABDOMEN: Soft, tenderness right upper quadrant, mass noted right upper quadrant. Bowel sounds present. No organomegaly or mass.  EXTREMITIES: No pedal edema, cyanosis, or clubbing.  NEUROLOGIC: Cranial nerves II through XII are intact. Muscle strength 4/5 in all extremities. Sensation intact. Gait not checked.  PSYCHIATRIC: The patient is alert and oriented x 2  SKIN: No obvious rash, lesion, or ulcer.   Physical Exam LABORATORY PANEL:   CBC  Recent Labs Lab 03/30/16 1321  WBC 6.5  HGB 7.7*  HCT 22.4*  PLT 125*   ------------------------------------------------------------------------------------------------------------------  Chemistries   Recent Labs Lab 03/30/16 0313  NA 143  K 3.8  CL 112*  CO2 21*  GLUCOSE 287*  BUN 44*  CREATININE 0.50  CALCIUM 8.3*  MG 1.5*  AST 27  ALT 21  ALKPHOS 34*  BILITOT 1.2   ------------------------------------------------------------------------------------------------------------------  Cardiac Enzymes  Recent Labs Lab 03/30/16 0313  TROPONINI <0.03   ------------------------------------------------------------------------------------------------------------------  RADIOLOGY:  Dg Chest Port 1 View  Result Date: 03/30/2016 CLINICAL DATA:  82 y/o F; nausea and vomiting accompanied by abdominal pain. Stage IV pancreatic cancer. Hypoxia. EXAM: PORTABLE CHEST 1 VIEW COMPARISON:  05/02/2007 chest radiographs FINDINGS: Stable cardiomegaly given projection and technique. No focal consolidation. No pneumothorax or pleural effusion. No pulmonary edema aortic atherosclerosis with arch calcification. No acute osseous abnormality is evident. Mild dextrocurvature of the thoracic spine. IMPRESSION: No acute cardiopulmonary process identified. Electronically Signed   By: Kristine Garbe M.D.   On: 03/30/2016 05:00    ASSESSMENT AND PLAN:   Active Problems:   GI bleed   Symptomatic anemia  Persistent atrial  fibrillation (HCC)   Hypotension   Malignant neoplasm of pancreas ([facility])  * Acute GI bleed   Symptomatic anemia     PRBC transfused   Follow CBC.   GI consult   Liquid diet for now.   IV PPI  * Pancreatic mass with possible malignancy   Previously seen by Dr. Grayland Ormond, biopsy was inconclusive, PET scan did not show distal mets.   Pt refused for any further work ups as per Dr. Gary Fleet note.  * sepsis   IV fluids and Broad spectrum Abx on admission.   Sent cx.  * DM   Hold DM meds as limited oral intake due to liquid diet.    All the records are reviewed and case discussed with Care Management/Social Workerr. Management plans discussed with the patient, family and they are in agreement.  CODE STATUS: DNR  TOTAL TIME TAKING CARE OF THIS PATIENT: 35 minutes.     POSSIBLE D/C IN 1-2 DAYS, DEPENDING ON CLINICAL CONDITION.   Vaughan Basta M.D on 03/30/2016   Between 7am to 6pm - Pager - [phone]  After 6pm go to [email or web address] - password [email or web address] [facility]  Office  [phone]  CC: Primary care physician; Tracie Harrier, MD  Note: This dictation was prepared with Dragon dictation along with smaller phrase technology. Any transcriptional errors that result from this process are unintentional.

## 2016-03-30 NOTE — Care Management (Addendum)
The following information is reported by Flo Shanks with [facility]. [facility] received a referral for this patient to go to the hospice home.  Was evaluated yesterday and was not appropriate for hospice home criteria.  She was under the care of her daughter.   Daughter is to move into subsidized housing which will not allow her to move her mother in with her.  The plan ws for the grandson to take patient to his home in [place] today with hospice service by hospice agency in [place].  She has terminal pancreatic cancer.  Admitted to ICU stepdown for sepsis and acute lower gi bleed. Receiving IVF, IV antibiotics and one unit packed cells. Gi consult pending.  There is coverage today 10/13.

## 2016-03-30 NOTE — Plan of Care (Signed)
Problem: Pain Managment: Goal: General experience of comfort will improve Outcome: Progressing No c/o pain today  Problem: Activity: Goal: Risk for activity intolerance will decrease Outcome: Progressing lots of rest encouraged today

## 2016-03-30 NOTE — Consult Note (Signed)
Consultation  Referring Provider:      Primary Care Physician:  Tracie Harrier, MD Primary Gastroenterologist:  San Jetty MD   Reason for Consultation:     Upper GI bleed     Impression / Plan:  Suspect UGI bleed: Maybe secondary to ulceration, mass penetration into the stomach or even varices. Discussed with patient whether she wants to proceed with EGD or not.  She has yet to decide at this time and will readdress with her and family tomorrow. I would also like to clarify with family what type of allergy she had with Prilosec because I would prefer to use an alternative PPI such as Protonix.            HPI:   [name] is a 82 y.o. female with a known history of a pancreatic mass, mesenteric vein thrombosis, portal vein thrombosis, hepatitis C, type 2 diabetes mellitus presented to the emergency room with abdominal pain, weakness, nausea, hematemesis and black stools. HGB on admission was 7.7 and rectal exam showed melena. Denies use of any ASA or NSAIDS.   Patient has a pancreatic mass and had an ERCP for biliary stricture with biliary stent placement on 08-2015 and stent exchange on 11-2015. Patient lives with her daughter at home and is currently DO NOT RESUSCITATE and DO NOT INTUBATE. Patient's daughter is currently moving to [place] from [place] and is planning to take the patient to and she gets settled in [place].   Patient was started on Famotidine drip because of an allergy to Prilosec. She is not aware of this allergy. She has received 2 units of PRBCS in the past 12 hrs. Repeat HGB pending at this time.    Past Medical History:  Diagnosis Date  . Common bile duct stricture    a. in setting of pancreatic mass s/p cbd stenting.  . Diabetes mellitus, type 2 ([place]) 11/01/2014  . Femur fracture ([place])   . Hepatitis C   . Malignant neoplasm of pancreas ([place])   . Pancreatic mass    a. 11/2014 U/S: 3.7x2.1 cm irregular hypoechoic mass in genu of pancreas. Cytology  & FNA revealed scan fragments of mucinous epithelium w/ minimal atypia. Endoscopic staging suggested a T2N0 lesion-->pt decline Bx.  . Pancreatitis   . Thrombosis of main, R, & L portal veins, splenic vein, and superior mesenteric vein    a. 10/2014 noted on MRI-->previously on lovenox-->D/C'd 12/2015 2/2 L thigh hematoma.    Past Surgical History:  Procedure Laterality Date  . ABDOMINAL HYSTERECTOMY    . APPENDECTOMY    . CHOLECYSTECTOMY    . ERCP N/A 08/19/2015   Procedure: ENDOSCOPIC RETROGRADE CHOLANGIOPANCREATOGRAPHY (ERCP);  Surgeon: Lucilla Lame, MD;  Location: [facility] ENDOSCOPY;  Service: Endoscopy;  Laterality: N/A;  . ERCP N/A 11/23/2015   Procedure: ENDOSCOPIC RETROGRADE CHOLANGIOPANCREATOGRAPHY (ERCP) stent exchange ;  Surgeon: Lucilla Lame, MD;  Location: [facility] ENDOSCOPY;  Service: Endoscopy;  Laterality: N/A;  . EUS N/A 11/18/2014   Procedure: ESOPHAGEAL ENDOSCOPIC ULTRASOUND (EUS) RADIAL;  Surgeon: Cora Daniels, MD;  Location: [facility] ENDOSCOPY;  Service: Endoscopy;  Laterality: N/A;  . INTRACAPSULAR CATARACT EXTRACTION Left 2005  . ORIF of left femur fracture  04/2007  . TOTAL HIP ARTHROPLASTY Left 1981    Family History  Problem Relation Age of Onset  . Hypertension Mother       Social History  Substance Use Topics  . Smoking status: Never Smoker  . Smokeless tobacco: Never Used  . Alcohol use 0.0 oz/week  Comment: Infrequent use; special occasion;    Prior to Admission medications   Medication Sig Start Date End Date Taking? Authorizing Provider  acetaminophen (TYLENOL) 500 MG tablet Take 1,000 mg by mouth every 6 (six) hours as needed for moderate pain. Reported on 08/12/2015   Yes Historical Provider, MD  glipiZIDE (GLUCOTROL) 5 MG tablet Take 5 mg by mouth 2 (two) times daily before a meal.   Yes Historical Provider, MD  lipase/protease/amylase (CREON) 12000 UNITS CPEP capsule Take 36,000 Units by mouth 3 (three) times daily with meals.   Yes Historical  Provider, MD  metFORMIN (GLUCOPHAGE) 500 MG tablet Take 1 tablet by mouth daily. 10/18/14  Yes Historical Provider, MD  potassium chloride SA (K-DUR,KLOR-CON) 20 MEQ tablet Take 20 mEq by mouth daily. 1/2 tab qd   Yes Historical Provider, MD  ranitidine (ZANTAC) 150 MG tablet Take 150 mg by mouth 2 (two) times daily.   Yes Historical Provider, MD    Current Facility-Administered Medications  Medication Dose Route Frequency Provider Last Rate Last Dose  . 0.9 %  sodium chloride infusion   Intravenous Continuous Saundra Shelling, MD 100 mL/hr at 03/30/16 1757    . acetaminophen (TYLENOL) tablet 650 mg  650 mg Oral Q6H PRN Saundra Shelling, MD       Or  . acetaminophen (TYLENOL) suppository 650 mg  650 mg Rectal Q6H PRN Saundra Shelling, MD      . amiodarone (PACERONE) tablet 400 mg  400 mg Oral BID Rogelia Mire, NP   400 mg at 03/30/16 1152  . famotidine (PEPCID) IVPB 20 mg premix  20 mg Intravenous Q12H Saundra Shelling, MD   20 mg at 03/30/16 1152  . morphine 2 MG/ML injection 2 mg  2 mg Intravenous Q4H PRN Pavan Pyreddy, MD      . ondansetron (ZOFRAN) tablet 4 mg  4 mg Oral Q6H PRN Saundra Shelling, MD       Or  . ondansetron (ZOFRAN) injection 4 mg  4 mg Intravenous Q6H PRN Pavan Pyreddy, MD      . piperacillin-tazobactam (ZOSYN) IVPB 3.375 g  3.375 g Intravenous Q8H Pavan Pyreddy, MD   3.375 g at 03/30/16 1402  . sodium chloride flush (NS) 0.9 % injection 3 mL  3 mL Intravenous Q12H Pavan Pyreddy, MD   3 mL at 03/30/16 1153  . vancomycin (VANCOCIN) IVPB 750 mg/150 ml premix  750 mg Intravenous Q18H Hinda Kehr, MD   750 mg at 03/30/16 1757    Allergies as of 03/30/2016 - Review Complete 03/30/2016  Allergen Reaction Noted  . Aspirin Other (See Comments) 10/19/2014  . Omeprazole Nausea Only 12/23/2014     Review of Systems:    This is positive for those things mentioned in the HPI, . All other review of systems are negative.       Physical Exam:  Vital signs in last 24 hours: Temp:   [97.6 F (36.4 C)-99.4 F (37.4 C)] 97.8 F (36.6 C) (10/13 1923) Pulse Rate:  [115-145] 122 (10/13 1923) Resp:  [12-20] 16 (10/13 1923) BP: (93-124)/(65-86) 108/76 (10/13 1600) SpO2:  [87 %-100 %] 100 % (10/13 1923) Weight:  [60.3 kg (133 lb)-61 kg (134 lb 7.7 oz)] 61 kg (134 lb 7.7 oz) (10/13 0829)    General:  Appears in no acute distress Eyes:  anicteric. ENT:   Mouth and posterior pharynx free of lesions.  Neck:   supple w/o thyromegaly or mass.  Lungs: Clear to auscultation bilaterally. Heart:  S1S2,  no rubs, murmurs, gallops. Abdomen:  soft, distended, no hepatosplenomegaly, and BS+.  Rectal: Not performed Lymph:  no cervical or supraclavicular adenopathy. Extremities:   no edema Skin   no rash. Neuro:  A&O x 3.  Psych:  appropriate mood and  Affect.   Data Reviewed:   LAB RESULTS:  Recent Labs  03/30/16 0313 03/30/16 1321  WBC 12.5* 6.5  HGB 7.7* 7.7*  HCT 23.3* 22.4*  PLT 194 125*   BMET  Recent Labs  03/30/16 0313  NA 143  K 3.8  CL 112*  CO2 21*  GLUCOSE 287*  BUN 44*  CREATININE 0.50  CALCIUM 8.3*   LFT  Recent Labs  03/30/16 0313  PROT 5.5*  ALBUMIN 2.8*  AST 27  ALT 21  ALKPHOS 34*  BILITOT 1.2   PT/INR  Recent Labs  03/30/16 0313  LABPROT 15.9*  INR 1.26    STUDIES: Dg Chest Port 1 View  Result Date: 03/30/2016 CLINICAL DATA:  82 y/o F; nausea and vomiting accompanied by abdominal pain. Stage IV pancreatic cancer. Hypoxia. EXAM: PORTABLE CHEST 1 VIEW COMPARISON:  05/02/2007 chest radiographs FINDINGS: Stable cardiomegaly given projection and technique. No focal consolidation. No pneumothorax or pleural effusion. No pulmonary edema aortic atherosclerosis with arch calcification. No acute osseous abnormality is evident. Mild dextrocurvature of the thoracic spine. IMPRESSION: No acute cardiopulmonary process identified. Electronically Signed   By: Kristine Garbe M.D.   On: 03/30/2016 05:00     PREVIOUS  ENDOSCOPIES:              Thanks,  San Jetty MD   LOS: 0 days   @Carl  Simonne Maffucci, MD, [facility] @  03/30/2016, 8:09 PM

## 2016-03-31 LAB — CBC
HCT: 23.4 % — ABNORMAL LOW (ref 35.0–47.0)
HEMATOCRIT: 24 % — AB (ref 35.0–47.0)
HEMATOCRIT: 24.8 % — AB (ref 35.0–47.0)
HEMOGLOBIN: 8 g/dL — AB (ref 12.0–16.0)
HEMOGLOBIN: 8.3 g/dL — AB (ref 12.0–16.0)
HEMOGLOBIN: 8.5 g/dL — AB (ref 12.0–16.0)
MCH: 31.8 pg (ref 26.0–34.0)
MCH: 32.1 pg (ref 26.0–34.0)
MCH: 32.1 pg (ref 26.0–34.0)
MCHC: 34.3 g/dL (ref 32.0–36.0)
MCHC: 34.7 g/dL (ref 32.0–36.0)
MCHC: 34.4 g/dL (ref 32.0–36.0)
MCV: 92.7 fL (ref 80.0–100.0)
MCV: 92.4 fL (ref 80.0–100.0)
MCV: 93.5 fL (ref 80.0–100.0)
PLATELETS: 114 10*3/uL — AB (ref 150–440)
Platelets: 119 10*3/uL — ABNORMAL LOW (ref 150–440)
Platelets: 120 10*3/uL — ABNORMAL LOW (ref 150–440)
RBC: 2.53 MIL/uL — AB (ref 3.80–5.20)
RBC: 2.59 MIL/uL — ABNORMAL LOW (ref 3.80–5.20)
RBC: 2.65 MIL/uL — AB (ref 3.80–5.20)
RDW: 16 % — ABNORMAL HIGH (ref 11.5–14.5)
RDW: 16.5 % — AB (ref 11.5–14.5)
RDW: 16.4 % — ABNORMAL HIGH (ref 11.5–14.5)
WBC: 6.3 10*3/uL (ref 3.6–11.0)
WBC: 5.9 10*3/uL (ref 3.6–11.0)
WBC: 5.9 10*3/uL (ref 3.6–11.0)

## 2016-03-31 LAB — BASIC METABOLIC PANEL
ANION GAP: 3 — AB (ref 5–15)
BUN: 28 mg/dL — ABNORMAL HIGH (ref 6–20)
CALCIUM: 7.6 mg/dL — AB (ref 8.9–10.3)
CHLORIDE: 118 mmol/L — AB (ref 101–111)
CO2: 23 mmol/L (ref 22–32)
CREATININE: 0.47 mg/dL (ref 0.44–1.00)
GFR calc non Af Amer: >60 mL/min (ref >60)
Glucose, Bld: 205 mg/dL — ABNORMAL HIGH (ref 65–99)
Potassium: 3.2 mmol/L — ABNORMAL LOW (ref 3.5–5.1)
SODIUM: 144 mmol/L (ref 135–145)

## 2016-03-31 LAB — URINE CULTURE

## 2016-03-31 MED ORDER — PANTOPRAZOLE SODIUM 40 MG PO TBEC
40.0000 mg | DELAYED_RELEASE_TABLET | Freq: Two times a day (BID) | ORAL | Status: DC
  Administered 2016-03-31 – 2016-04-03 (×6): 40 mg via ORAL
  Filled 2016-03-31 (×8): qty 1

## 2016-03-31 NOTE — Progress Notes (Signed)
Pharmacy Antibiotic Note  [name] is a 82 y.o. female admitted on 03/30/2016 with sepsis.  Pharmacy has been consulted for Zosyn and vancomycin dosing.  Plan: 1. Zosyn 3.375 gm IV Q8H EI 2. Continue vancomycin 750 mg iv q 18 hours.   Height: 5\' 2"  (157.5 cm) Weight: 134 lb 7.7 oz (61 kg) IBW/kg (Calculated) : 50.1  Temp (24hrs), Avg:98 F (36.7 C), Min:97.7 F (36.5 C), Max:98.5 F (36.9 C)   Recent Labs Lab 03/30/16 0313 03/30/16 0621 03/30/16 1321 03/30/16 2049 03/31/16 0312 03/31/16 0845  WBC 12.5*  --  6.5 7.4 6.3 5.9  CREATININE 0.50  --   --   --  0.47  --   LATICACIDVEN 3.4* 1.4  --   --   --   --     Estimated Creatinine Clearance: 45 mL/min (by C-G formula based on SCr of 0.47 mg/dL).    Allergies  Allergen Reactions  . Aspirin Other (See Comments)    Stomach problems  . Omeprazole Nausea Only    Thank you for allowing pharmacy to be a part of this patient's care.  Ulice Dash, PharmD Clinical Pharmacist  03/31/2016 9:39 AM

## 2016-03-31 NOTE — Progress Notes (Signed)
    Subjective:  Denies CP or dyspnea; complains of cough   Objective:  Vitals:   03/31/16 0700 03/31/16 0800 03/31/16 0900 03/31/16 1000  BP: 123/68 (!) 114/59 120/65 (!) 121/58  Pulse: 78 78 81 78  Resp: 12 13 13 12   Temp:  98 F (36.7 C)    TempSrc:  Axillary    SpO2: 100% 98% 98% 98%  Weight:      Height:        Intake/Output from previous day:  Intake/Output Summary (Last 24 hours) at 03/31/16 1110 Last data filed at 03/31/16 0800  Gross per 24 hour  Intake          4025.83 ml  Output              350 ml  Net          3675.83 ml    Physical Exam: Physical exam: Well-developed well-nourished in no acute distress.  Skin is warm and dry.  HEENT is normal.  Neck is supple.  Chest diminished BS bases Cardiovascular exam is regular rate and rhythm.  Abdominal exam nontender; mildly distended; umbilical hernia. No masses palpated. Extremities show no edema. neuro grossly intact    Lab Results: Basic Metabolic Panel:  Recent Labs  03/30/16 0313 03/31/16 0312  NA 143 144  K 3.8 3.2*  CL 112* 118*  CO2 21* 23  GLUCOSE 287* 205*  BUN 44* 28*  CREATININE 0.50 0.47  CALCIUM 8.3* 7.6*  MG 1.5*  --    CBC:  Recent Labs  03/30/16 0313  03/31/16 0312 03/31/16 0845  WBC 12.5*  < > 6.3 5.9  NEUTROABS 10.7*  --   --   --   HGB 7.7*  < > 8.0* 8.3*  HCT 23.3*  < > 23.4* 24.0*  MCV 97.2  < > 92.7 92.4  PLT 194  < > 114* 119*  < > = values in this interval not displayed. Cardiac Enzymes:  Recent Labs  03/30/16 0313  TROPONINI <0.03     Assessment/Plan:  1 PAF-back in sinus today; continue amiodarone; CHADSvasc 4; however not a candidate for anticoagulation given active bleeding.   2 GI bleed-management per primary care/GI.  3 Pancreatic mass.  Kirk Ruths 03/31/2016, 11:10 AM

## 2016-03-31 NOTE — Progress Notes (Signed)
Pt was alert. Grandson and his wife in room.  Requested Education on Ad.  [place] supplied the document and explained the various parts.  Pt signed various parts.  [place] arranged for [employer] and two witnesses.  Documentation was completed.  [facility] offered any other services but Pt declined.   03/31/16 1345  Clinical Encounter Type  Visited With Patient and family together  Visit Type Initial  Referral From Nurse  Spiritual Encounters  Spiritual Needs Emotional  Stress Factors  Patient Stress Factors None identified  Family Stress Factors None identified  Advance Directives (For Healthcare)  Does patient have an advance directive? Yes  Type of Paramedic of [place];Living will

## 2016-03-31 NOTE — Progress Notes (Signed)
[place] at [place]    MR#:  [id number]  DATE OF BIRTH:  09/26/1928  SUBJECTIVE:  CHIEF COMPLAINT:   Chief Complaint  Patient presents with  . Nausea  . Emesis     Came with generalized weakness and dark stool.   Receiving PRBC, and have a fib with RVR, BP is low normal.  Hb stable now, in past she refused for further work up of pancreatic mass, today earlier she refused to have endoscopy, but later daughter in law called me- she will like to have it and she convinced the pt.  REVIEW OF SYSTEMS:   CONSTITUTIONAL: No fever, positive for fatigue or weakness.  EYES: No blurred or double vision.  EARS, NOSE, AND THROAT: No tinnitus or ear pain.  RESPIRATORY: No cough, shortness of breath, wheezing or hemoptysis.  CARDIOVASCULAR: No chest pain, orthopnea, edema.  GASTROINTESTINAL: No nausea, vomiting, diarrhea or abdominal pain. Have dark stool. GENITOURINARY: No dysuria, hematuria.  ENDOCRINE: No polyuria, nocturia,  HEMATOLOGY: No anemia, easy bruising or bleeding SKIN: No rash or lesion. MUSCULOSKELETAL: No joint pain or arthritis.   NEUROLOGIC: No tingling, numbness, weakness.  PSYCHIATRY: No anxiety or depression.   ROS  DRUG ALLERGIES:   Allergies  Allergen Reactions  . Aspirin Other (See Comments)    Stomach problems  . Omeprazole Nausea Only    VITALS:  Blood pressure 117/77, pulse 79, temperature 98 F (36.7 C), temperature source Axillary, resp. rate 11, height 5\' 2"  (1.575 m), weight 61 kg (134 lb 7.7 oz), SpO2 97 %.  PHYSICAL EXAMINATION:   GENERAL:  82 y.o.-year-old patient lying in the bed in mild distress.  EYES: Pupils equal, round, reactive to light and accommodation. No scleral icterus. Has pallor noted. Extraocular muscles intact.  HEENT: Head atraumatic, normocephalic. Oropharynx and nasopharynx clear. Conjunctiva pale. NECK:  Supple, no jugular venous distention. No thyroid  enlargement, no tenderness.  LUNGS: Normal breath sounds bilaterally, no wheezing, rales,rhonchi or crepitation. No use of accessory muscles of respiration.  CARDIOVASCULAR: S1, S2 tacycardia noted. No murmurs, rubs, or gallops.  ABDOMEN: Soft, tenderness right upper quadrant, mass noted right upper quadrant. Bowel sounds present. No organomegaly or mass.  EXTREMITIES: No pedal edema, cyanosis, or clubbing.  NEUROLOGIC: Cranial nerves II through XII are intact. Muscle strength 4/5 in all extremities. Sensation intact. Gait not checked.  PSYCHIATRIC: The patient is alert and oriented x 2  SKIN: No obvious rash, lesion, or ulcer.   Physical Exam LABORATORY PANEL:   CBC  Recent Labs Lab 03/31/16 0845  WBC 5.9  HGB 8.3*  HCT 24.0*  PLT 119*   ------------------------------------------------------------------------------------------------------------------  Chemistries   Recent Labs Lab 03/30/16 0313 03/31/16 0312  NA 143 144  K 3.8 3.2*  CL 112* 118*  CO2 21* 23  GLUCOSE 287* 205*  BUN 44* 28*  CREATININE 0.50 0.47  CALCIUM 8.3* 7.6*  MG 1.5*  --   AST 27  --   ALT 21  --   ALKPHOS 34*  --   BILITOT 1.2  --    ------------------------------------------------------------------------------------------------------------------  Cardiac Enzymes  Recent Labs Lab 03/30/16 0313  TROPONINI <0.03   ------------------------------------------------------------------------------------------------------------------  RADIOLOGY:  Dg Chest Port 1 View  Result Date: 03/30/2016 CLINICAL DATA:  82 y/o F; nausea and vomiting accompanied by abdominal pain. Stage IV pancreatic cancer. Hypoxia. EXAM: PORTABLE CHEST 1 VIEW COMPARISON:  05/02/2007 chest radiographs FINDINGS: Stable cardiomegaly given projection and technique. No focal consolidation.  No pneumothorax or pleural effusion. No pulmonary edema aortic atherosclerosis with arch calcification. No acute osseous abnormality is  evident. Mild dextrocurvature of the thoracic spine. IMPRESSION: No acute cardiopulmonary process identified. Electronically Signed   By: Kristine Garbe M.D.   On: 03/30/2016 05:00    ASSESSMENT AND PLAN:   Active Problems:   GI bleed   Symptomatic anemia   Persistent atrial fibrillation (HCC)   Hypotension   Malignant neoplasm of pancreas (HCC)  * Acute GI bleed   Symptomatic anemia     PRBC transfused   Follow CBC.   GI consult appreciated, suggested to have endoscopy on Monday with Dr.Wohl.   Liquid diet for now. Upgrade to soft as she tolerated.   PPI BID.  * Pancreatic mass with possible malignancy   Previously seen by Dr. Grayland Ormond, biopsy was inconclusive, PET scan did not show distal mets.   Pt refused for any further work ups as per Dr. Gary Fleet note.   I had detailed discussion today with her daughter and daughter in law, they will like to arrange for hospice help on d/c.  * sepsis   IV fluids and Broad spectrum Abx on admission.   Sent cx.  * DM   Hold DM meds as limited oral intake due to liquid diet.   All the records are reviewed and case discussed with Care Management/Social Workerr. Management plans discussed with the patient, family and they are in agreement.  CODE STATUS: DNR  TOTAL TIME TAKING CARE OF THIS PATIENT: 45 minutes.   Transfer to floor.  POSSIBLE D/C IN 1-2 DAYS, DEPENDING ON CLINICAL CONDITION.   Vaughan Basta M.D on 03/31/2016   Between 7am to 6pm - Pager - [phone]  After 6pm go to [email or web address] - password [email or web address] [place] [facility]  Office  [phone]  CC: Primary care physician; Tracie Harrier, MD  Note: This dictation was prepared with Dragon dictation along with smaller phrase technology. Any transcriptional errors that result from this process are unintentional.

## 2016-03-31 NOTE — Consult Note (Signed)
GI Inpatient Follow-up Note  Patient Identification:   Subjective:  82 y/o/ white female with upper gastrointestinal bleed who received 2 UPRBCS yesterday. HGB 7.7 to 8.3. Patient resting in no discomfort and tolerating clear liquid diet. On Famotidine IV q 12 hrs. Patient family not present to discuss if everyone agrees on proceeding with EGD to look for source of bleed.  Scheduled Inpatient Medications:  . amiodarone  400 mg Oral BID  . famotidine (PEPCID) IV  20 mg Intravenous Q12H  . piperacillin-tazobactam (ZOSYN)  IV  3.375 g Intravenous Q8H  . sodium chloride flush  3 mL Intravenous Q12H  . vancomycin  750 mg Intravenous Q18H    Continuous Inpatient Infusions:   . sodium chloride 100 mL/hr at 03/31/16 0800    PRN Inpatient Medications:  acetaminophen **OR** acetaminophen, morphine injection, ondansetron **OR** ondansetron (ZOFRAN) IV  Review of Systems:  Constitutional: Weight is stable.  Eyes: No changes in vision. ENT: No oral lesions, sore throat.  GI: see HPI.  Heme/Lymph: No easy bruising.  CV: No chest pain.  GU: No hematuria.  Integumentary: No rashes.  Neuro: No headaches.  Psych: No depression/anxiety.  Endocrine: No heat/cold intolerance.  Allergic/Immunologic: No urticaria.  Resp: No cough, SOB.  Musculoskeletal: No joint swelling.    Physical Examination: BP 120/65   Pulse 81   Temp 98 F (36.7 C) (Axillary)   Resp 13   Ht 5\' 2"  (1.575 m)   Wt 61 kg (134 lb 7.7 oz)   SpO2 98%   BMI 24.60 kg/m  Gen: NAD, alert and oriented x 4 HEENT: PEERLA, EOMI, Neck: supple, no JVD or thyromegaly Chest: CTA bilaterally, no wheezes, crackles, or other adventitious sounds CV: RRR, no m/g/c/r Abd: soft, distended, +BS in all four quadrants; no HSM, or rebound tenderness Ext: no edema Skin: no rash or lesions noted   Data: Lab Results  Component Value Date   WBC 5.9 03/31/2016   HGB 8.3 (L) 03/31/2016   HCT 24.0 (L) 03/31/2016   MCV 92.4 03/31/2016    PLT 119 (L) 03/31/2016    Recent Labs Lab 03/30/16 2049 03/31/16 0312 03/31/16 0845  HGB 8.7* 8.0* 8.3*   Lab Results  Component Value Date   NA 144 03/31/2016   K 3.2 (L) 03/31/2016   CL 118 (H) 03/31/2016   CO2 23 03/31/2016   BUN 28 (H) 03/31/2016   CREATININE 0.47 03/31/2016   Lab Results  Component Value Date   ALT 21 03/30/2016   AST 27 03/30/2016   ALKPHOS 34 (L) 03/30/2016   BILITOT 1.2 03/30/2016    Recent Labs Lab 03/30/16 0313  INR 1.26    Assessment/Plan: Ms. Reil is a 82 y.o. female with upper gastrointestinal bleed stable after 2 UPRBCS.   Recommendations: Stop Famotidine and start on IV Protonix 80mg  IV q 12 hrs and see if tolerates. Will await to see if patient remains stable. If GI bleeding continues will need to get approval from family to proceed with an EGD to identify source of bleed.   Please call with questions or concerns.  San Jetty, MD

## 2016-04-01 ENCOUNTER — Inpatient Hospital Stay: Payer: PPO

## 2016-04-01 LAB — PREPARE RBC (CROSSMATCH)

## 2016-04-01 LAB — HEMOGLOBIN: HEMOGLOBIN: 7.2 g/dL — AB (ref 12.0–16.0)

## 2016-04-01 MED ORDER — SODIUM CHLORIDE 0.9 % IV SOLN
Freq: Once | INTRAVENOUS | Status: AC
  Administered 2016-04-01: 15:00:00 via INTRAVENOUS

## 2016-04-01 NOTE — Consult Note (Signed)
GI Inpatient Follow-up Note  Patient Identification: [name] is a 82 y.o. female DNR who has a history of a pancreatic mass and biliary stent presents with an UGI bleed. S/P 2 UPRBCS on admission. Patient reports no BM's and HGB (8.5) has improved and has remained stable in past 24 hrs. HGB pending this morning. Patient tolerating diet.   Subjective:  Scheduled Inpatient Medications:  . amiodarone  400 mg Oral BID  . famotidine (PEPCID) IV  20 mg Intravenous Q12H  . pantoprazole  40 mg Oral BID  . sodium chloride flush  3 mL Intravenous Q12H    Continuous Inpatient Infusions:     PRN Inpatient Medications:  acetaminophen **OR** acetaminophen, morphine injection, ondansetron **OR** ondansetron (ZOFRAN) IV  Review of Systems:  Constitutional: Weight is stable.  Eyes: No changes in vision. ENT: No oral lesions, sore throat.  GI: see HPI.  Heme/Lymph: No easy bruising.  CV: No chest pain.  GU: No hematuria.  Integumentary: No rashes.  Neuro: No headaches.  Psych: No depression/anxiety.  Endocrine: No heat/cold intolerance.  Allergic/Immunologic: No urticaria.  Resp: No cough, SOB.  Musculoskeletal: No joint swelling.    Physical Examination: BP 121/60 (BP Location: Right Arm)   Pulse 84   Temp 97.5 F (36.4 C) (Oral)   Resp 19   Ht 5\' 2"  (1.575 m)   Wt 65.8 kg (145 lb 1.6 oz)   SpO2 97%   BMI 26.54 kg/m  Gen: NAD, alert and oriented x 4 HEENT: PEERLA, EOMI, Neck: supple, no JVD or thyromegaly Chest: CTA bilaterally, no wheezes, crackles, or other adventitious sounds CV: RRR, no m/g/c/r Abd: +BS in all four quadrants; no HSM, abdomen is distended and tender on palpation Ext: no edema Skin: no rash or lesions noted   Data: Lab Results  Component Value Date   WBC 5.9 03/31/2016   HGB 8.5 (L) 03/31/2016   HCT 24.8 (L) 03/31/2016   MCV 93.5 03/31/2016   PLT 120 (L) 03/31/2016    Recent Labs Lab 03/31/16 0312 03/31/16 0845 03/31/16 1451  HGB  8.0* 8.3* 8.5*   Lab Results  Component Value Date   NA 144 03/31/2016   K 3.2 (L) 03/31/2016   CL 118 (H) 03/31/2016   CO2 23 03/31/2016   BUN 28 (H) 03/31/2016   CREATININE 0.47 03/31/2016   Lab Results  Component Value Date   ALT 21 03/30/2016   AST 27 03/30/2016   ALKPHOS 34 (L) 03/30/2016   BILITOT 1.2 03/30/2016    Recent Labs Lab 03/30/16 0313  INR 1.26    Assessment/Plan: Ms. Carchi is a 82 y.o. female DNR with an upper gastrointestinal bleed.  Bleeding has appeared to stopped with PPI intervention. Today's HGB is still pending.   Recommendations: Patient and family are agreeable to proceed with endoscopy if bleeding resumes. Keep on Protonix 40mg  bid. No NSAIDS or ASA.    Please call with questions or concerns.  San Jetty, MD

## 2016-04-01 NOTE — Care Management Important Message (Signed)
Important Message  Patient Details  Name: [name] MRN: [id number] Date of Birth: Oct 24, 1928   Medicare Important Message Given:  Yes    Arabela Basaldua A, RN 04/01/2016, 2:46 PM

## 2016-04-01 NOTE — Progress Notes (Signed)
PT Cancellation Note  Patient Details Name: [name] MRN: [id number] DOB: Nov 15, 1928   Cancelled Treatment:    Reason Eval/Treat Not Completed: Other (comment).  Pt refusing PT at this time d/t feeling really tired and wanting to rest/sleep.  Of note, pt's Hgb 7.2 (nursing reports plan for blood transfusion today and EGD tomorrow).  Will re-attempt PT eval at a later date/time as medically appropriate.   Raquel Sarna Dasie Chancellor 04/01/2016, 2:10 PM Leitha Bleak, [place]

## 2016-04-01 NOTE — Progress Notes (Signed)
Pt had large black stool with blood clots. Hemoglobin decreased from 8.5 to 7.2. MD made aware. Orders given to transfuse 2 units of packed red blood cells. Ammie Dalton, RN

## 2016-04-01 NOTE — Progress Notes (Signed)
[facility]    MR#:  [id number]  DATE OF BIRTH:  06-Jun-1929  SUBJECTIVE:  CHIEF COMPLAINT:   Chief Complaint  Patient presents with  . Nausea  . Emesis     Came with generalized weakness and dark stool.   Receiving PRBC, and have a fib with RVR, BP is low normal.  Hb stable now, in past she refused for further work up of pancreatic mass, today earlier she refused to have  endoscopy, but later daughter in law called me- she will like to have it and she convinced the pt.  today she had some pain in abdomen after having soft diet, and abdomen is distended.  REVIEW OF SYSTEMS:   CONSTITUTIONAL: No fever, positive for fatigue or weakness.  EYES: No blurred or double vision.  EARS, NOSE, AND THROAT: No tinnitus or ear pain.  RESPIRATORY: No cough, shortness of breath, wheezing or hemoptysis.  CARDIOVASCULAR: No chest pain, orthopnea, edema.  GASTROINTESTINAL: No nausea, vomiting, diarrhea or abdominal pain. Have dark stool. GENITOURINARY: No dysuria, hematuria.  ENDOCRINE: No polyuria, nocturia,  HEMATOLOGY: No anemia, easy bruising or bleeding SKIN: No rash or lesion. MUSCULOSKELETAL: No joint pain or arthritis.   NEUROLOGIC: No tingling, numbness, weakness.  PSYCHIATRY: No anxiety or depression.   ROS  DRUG ALLERGIES:   Allergies  Allergen Reactions  . Aspirin Other (See Comments)    Stomach problems  . Omeprazole Nausea Only    VITALS:  Blood pressure 121/60, pulse 84, temperature 97.5 F (36.4 C), temperature source Oral, resp. rate 19, height 5\' 2"  (1.575 m), weight 65.8 kg (145 lb 1.6 oz), SpO2 97 %.  PHYSICAL EXAMINATION:   GENERAL:  82 y.o.-year-old patient lying in the bed in mild distress.  EYES: Pupils equal, round, reactive to light and accommodation. No scleral icterus. Has pallor noted. Extraocular muscles intact.  HEENT: Head atraumatic, normocephalic. Oropharynx and nasopharynx  clear. Conjunctiva pale. NECK:  Supple, no jugular venous distention. No thyroid enlargement, no tenderness.  LUNGS: Normal breath sounds bilaterally, no wheezing, rales,rhonchi or crepitation. No use of accessory muscles of respiration.  CARDIOVASCULAR: S1, S2 tacycardia noted. No murmurs, rubs, or gallops.  ABDOMEN: Soft, distended, tenderness right upper quadrant, mass noted right upper quadrant. Bowel sounds present sluggish. No organomegaly or mass.  EXTREMITIES: No pedal edema, cyanosis, or clubbing.  NEUROLOGIC: Cranial nerves II through XII are intact. Muscle strength 4/5 in all extremities. Sensation intact. Gait not checked.  PSYCHIATRIC: The patient is alert and oriented x 2  SKIN: No obvious rash, lesion, or ulcer.   Physical Exam LABORATORY PANEL:   CBC  Recent Labs Lab 03/31/16 1451  WBC 5.9  HGB 8.5*  HCT 24.8*  PLT 120*   ------------------------------------------------------------------------------------------------------------------  Chemistries   Recent Labs Lab 03/30/16 0313 03/31/16 0312  NA 143 144  K 3.8 3.2*  CL 112* 118*  CO2 21* 23  GLUCOSE 287* 205*  BUN 44* 28*  CREATININE 0.50 0.47  CALCIUM 8.3* 7.6*  MG 1.5*  --   AST 27  --   ALT 21  --   ALKPHOS 34*  --   BILITOT 1.2  --    ------------------------------------------------------------------------------------------------------------------  Cardiac Enzymes  Recent Labs Lab 03/30/16 0313  TROPONINI <0.03   ------------------------------------------------------------------------------------------------------------------  RADIOLOGY:  No results found.  ASSESSMENT AND PLAN:   Active Problems:   GI bleed   Symptomatic anemia   Persistent atrial fibrillation (HCC)   Hypotension  Malignant neoplasm of pancreas (HCC)  * Acute GI bleed   Symptomatic anemia     PRBC transfused   Follow CBC.   GI consult appreciated, suggested to have endoscopy on Monday with Dr.Wohl.    Liquid diet for now. Upgrade to soft as she tolerated.   PPI BID.   Had some pain in abdomen with soft diet, change back to liquids,and get KUB xray.  * Pancreatic mass with possible malignancy   Previously seen by Dr. Grayland Ormond, biopsy was inconclusive, PET scan did not show distal mets.   Pt refused for any further work ups as per Dr. Gary Fleet note.   I had detailed discussion today with her daughter and daughter in law, they will like to arrange for hospice help on d/c.  * sepsis   IV fluids and Broad spectrum Abx on admission.   Sent cx.   So far no clear source of infection, cx negative, stopped ABX, afebrile.   Sepsis ruled out.  * DM   Hold DM meds as limited oral intake due to liquid diet.   All the records are reviewed and case discussed with Care Management/Social Workerr. Management plans discussed with the patient, family and they are in agreement.  CODE STATUS: DNR  TOTAL TIME TAKING CARE OF THIS PATIENT: 35 minutes.    POSSIBLE D/C IN 1-2 DAYS, DEPENDING ON CLINICAL CONDITION.   Vaughan Basta M.D on 04/01/2016   Between 7am to 6pm - Pager - [phone]  After 6pm go to [email or web address] - password [email or web address] [facility]  Office  [phone]  CC: Primary care physician; Tracie Harrier, MD  Note: This dictation was prepared with Dragon dictation along with smaller phrase technology. Any transcriptional errors that result from this process are unintentional.

## 2016-04-02 ENCOUNTER — Inpatient Hospital Stay: Payer: PPO | Admitting: Anesthesiology

## 2016-04-02 ENCOUNTER — Encounter: Payer: Self-pay | Admitting: *Deleted

## 2016-04-02 ENCOUNTER — Encounter
Admission: EM | Disposition: A | Discharge status: Home / Self Care | Payer: Self-pay | Source: Home / Self Care | Attending: Internal Medicine

## 2016-04-02 DIAGNOSIS — K920 Hematemesis: Secondary | ICD-10-CM

## 2016-04-02 DIAGNOSIS — I480 Paroxysmal atrial fibrillation: Secondary | ICD-10-CM

## 2016-04-02 DIAGNOSIS — E876 Hypokalemia: Secondary | ICD-10-CM

## 2016-04-02 DIAGNOSIS — A419 Sepsis, unspecified organism: Secondary | ICD-10-CM

## 2016-04-02 DIAGNOSIS — N390 Urinary tract infection, site not specified: Secondary | ICD-10-CM

## 2016-04-02 DIAGNOSIS — K226 Gastro-esophageal laceration-hemorrhage syndrome: Secondary | ICD-10-CM

## 2016-04-02 HISTORY — PX: ESOPHAGOGASTRODUODENOSCOPY (EGD) WITH PROPOFOL: SHX5813

## 2016-04-02 LAB — TYPE AND SCREEN
ABO/RH(D): A POS
Antibody Screen: NEGATIVE
UNIT DIVISION: 0
Unit division: 0
Unit division: 0
Unit division: 0

## 2016-04-02 LAB — BASIC METABOLIC PANEL
Anion gap: 7 (ref 5–15)
BUN: 27 mg/dL — AB (ref 6–20)
CALCIUM: 7.8 mg/dL — AB (ref 8.9–10.3)
CO2: 18 mmol/L — ABNORMAL LOW (ref 22–32)
Chloride: 117 mmol/L — ABNORMAL HIGH (ref 101–111)
Creatinine, Ser: 0.51 mg/dL (ref 0.44–1.00)
GFR calc Af Amer: >60 mL/min (ref >60)
GLUCOSE: 271 mg/dL — AB (ref 65–99)
POTASSIUM: 3.5 mmol/L (ref 3.5–5.1)
Sodium: 142 mmol/L (ref 135–145)

## 2016-04-02 LAB — CBC
HCT: 24 % — ABNORMAL LOW (ref 35.0–47.0)
Hemoglobin: 8.3 g/dL — ABNORMAL LOW (ref 12.0–16.0)
MCH: 31.9 pg (ref 26.0–34.0)
MCHC: 34.7 g/dL (ref 32.0–36.0)
MCV: 91.8 fL (ref 80.0–100.0)
PLATELETS: 114 10*3/uL — AB (ref 150–440)
RBC: 2.61 MIL/uL — ABNORMAL LOW (ref 3.80–5.20)
RDW: 16.3 % — AB (ref 11.5–14.5)
WBC: 7.5 10*3/uL (ref 3.6–11.0)

## 2016-04-02 LAB — GLUCOSE, CAPILLARY
Glucose-Capillary: 236 mg/dL — ABNORMAL HIGH (ref 65–99)
Glucose-Capillary: 154 mg/dL — ABNORMAL HIGH (ref 65–99)
Glucose-Capillary: 228 mg/dL — ABNORMAL HIGH (ref 65–99)

## 2016-04-02 SURGERY — ESOPHAGOGASTRODUODENOSCOPY (EGD) WITH PROPOFOL
Anesthesia: General

## 2016-04-02 MED ORDER — SODIUM CHLORIDE 0.9 % IV SOLN
INTRAVENOUS | Status: DC
  Administered 2016-04-02 (×3): via INTRAVENOUS

## 2016-04-02 MED ORDER — LIDOCAINE HCL (CARDIAC) 20 MG/ML IV SOLN
INTRAVENOUS | Status: DC | PRN
  Administered 2016-04-02: 100 mg via INTRAVENOUS

## 2016-04-02 MED ORDER — PROPOFOL 10 MG/ML IV BOLUS
INTRAVENOUS | Status: DC | PRN
  Administered 2016-04-02 (×2): 20 mg via INTRAVENOUS
  Administered 2016-04-02: 10 mg via INTRAVENOUS

## 2016-04-02 MED ORDER — INSULIN ASPART 100 UNIT/ML ~~LOC~~ SOLN
0.0000 [IU] | Freq: Three times a day (TID) | SUBCUTANEOUS | Status: DC
  Administered 2016-04-02: 18:00:00 3 [IU] via SUBCUTANEOUS
  Administered 2016-04-02 – 2016-04-03 (×3): 5 [IU] via SUBCUTANEOUS
  Filled 2016-04-02 (×3): qty 5
  Filled 2016-04-02: qty 3

## 2016-04-02 MED ORDER — PROPOFOL 500 MG/50ML IV EMUL
INTRAVENOUS | Status: DC | PRN
  Administered 2016-04-02: 140 ug/kg/min via INTRAVENOUS

## 2016-04-02 MED ORDER — FENTANYL CITRATE (PF) 100 MCG/2ML IJ SOLN
INTRAMUSCULAR | Status: DC | PRN
  Administered 2016-04-02: 25 ug via INTRAVENOUS

## 2016-04-02 MED ORDER — MIDAZOLAM HCL 2 MG/2ML IJ SOLN
INTRAMUSCULAR | Status: DC | PRN
  Administered 2016-04-02: 0.5 mg via INTRAVENOUS

## 2016-04-02 NOTE — Evaluation (Signed)
Physical Therapy Evaluation Patient Details Name: [name] MRN: [id number] DOB: 01-09-1929 Today's Date: 04/02/2016   History of Present Illness  Pt admitted for GI bleed, however medically stable at this time. Pt with history of pancreatic mass as well as pancreatic cancer. Pt is now s/p biliary stent. Pt with complaints of nausea and abdominal pain.   Clinical Impression  Pt is a pleasant 82 year old female who was admitted for GI bleed. Pt performs bed mobility, transfers, and ambulation with cga and HHA. Pt demonstrates deficits with strength/endurance. Pt reports she is very close to baseline level. Pt asking for comb and toothbrush, aide brought to room. Would benefit from skilled PT to address above deficits and promote optimal return to PLOF. Recommend trial use of RW to improve balance as she currently reaching out for furniture during ambulation. Further ambulation distance limited as pt needed to use bathroom. Recommend transition to [place] upon discharge from acute hospitalization, however pt reports she does not want HHPT.       Follow Up Recommendations Home health PT (however currently refusing)    Equipment Recommendations  None recommended by PT    Recommendations for Other Services       Precautions / Restrictions Precautions Precautions: Fall Restrictions Weight Bearing Restrictions: No      Mobility  Bed Mobility Overal bed mobility: Needs Assistance Bed Mobility: Supine to Sit     Supine to sit: Min guard     General bed mobility comments: cues given for technique. Once seated at EOB, pt able to square hips and scoot out towards EOB.  Transfers Overall transfer level: Needs assistance Equipment used: None Transfers: Sit to/from Stand Sit to Stand: Min guard         General transfer comment: safe technique performed with pt able to push from seated surface. Upright posture noted  Ambulation/Gait Ambulation/Gait assistance: Min  guard Ambulation Distance (Feet): 20 Feet Assistive device: 1 person hand held assist Gait Pattern/deviations: Step-to pattern     General Gait Details: ambulated in room to recliner and then in room to [facility]; with slow gait pattern. Pt reaching out for furniture, therapist provided HHA. No LOB noted. Pt able to navigate obstacles in room.  Stairs            Wheelchair Mobility    Modified Rankin (Stroke Patients Only)       Balance Overall balance assessment: Needs assistance Sitting-balance support: Feet supported Sitting balance-Leahy Scale: Normal     Standing balance support: Single extremity supported Standing balance-Leahy Scale: Fair                               Pertinent Vitals/Pain Pain Assessment: No/denies pain    Home Living Family/patient expects to be discharged to:: Private residence Living Arrangements:  (was living with daughter; plans to move in with grandson) Available Help at Discharge: Family Type of Home: House Home Access: Level entry     Home Layout: One level Home Equipment: Environmental consultant - 2 wheels;Cane - single point;Bedside commode      Prior Function Level of Independence: Independent         Comments: household ambulator     Hand Dominance        Extremity/Trunk Assessment   Upper Extremity Assessment: Overall WFL for tasks assessed           Lower Extremity Assessment: Generalized weakness (B LE grossly 4/5)  Communication   Communication: No difficulties  Cognition Arousal/Alertness: Awake/alert Behavior During Therapy: WFL for tasks assessed/performed Overall Cognitive Status: Within Functional Limits for tasks assessed                      General Comments      Exercises Other Exercises Other Exercises: Pt ambulated to [facility] in room. Pt able to pull brief down without help. Therapist assisted with donning new brief while pt sitting. RN entered room and pt left with RN to assist  getting back to recliner.   Assessment/Plan    PT Assessment Patient needs continued PT services  PT Problem List Decreased strength;Decreased activity tolerance;Decreased balance;Decreased mobility          PT Treatment Interventions Gait training;DME instruction;Therapeutic exercise    PT Goals (Current goals can be found in the Care Plan section)  Acute Rehab PT Goals Patient Stated Goal: to go home with grandson PT Goal Formulation: With patient Time For Goal Achievement: 04/16/16 Potential to Achieve Goals: Good    Frequency Min 2X/week   Barriers to discharge        Co-evaluation               End of Session Equipment Utilized During Treatment: Gait belt Activity Tolerance: Patient tolerated treatment well Patient left:  (on BSC with RN in room) Nurse Communication: Mobility status         Time: [id number] PT Time Calculation (min) (ACUTE ONLY): 13 min   Charges:   PT Evaluation $PT Eval Low Complexity: 1 Procedure PT Treatments $Therapeutic Activity: 8-22 mins   PT G Codes:        Glenice Ciccone 04/16/16, 10:06 AM  Greggory Stallion, PT, DPT [phone]

## 2016-04-02 NOTE — Progress Notes (Signed)
[name]    MR#:  [id number]  DATE OF BIRTH:  24-Apr-1929  SUBJECTIVE:  CHIEF COMPLAINT:   Chief Complaint  Patient presents with  . Nausea  . Emesis     Came with generalized weakness and dark stool.   Receiving PRBC, and have a fib with RVR, BP is low normal.  Hb stable now, in past she refused for further work up of pancreatic mass, today earlier she refused to have  endoscopy, but later daughter in law called me- she will like to have it and she convinced the pt.  no more pain in abdomen, awaited to have EGD later today.  REVIEW OF SYSTEMS:   CONSTITUTIONAL: No fever, positive for fatigue or weakness.  EYES: No blurred or double vision.  EARS, NOSE, AND THROAT: No tinnitus or ear pain.  RESPIRATORY: No cough, shortness of breath, wheezing or hemoptysis.  CARDIOVASCULAR: No chest pain, orthopnea, edema.  GASTROINTESTINAL: No nausea, vomiting, diarrhea or abdominal pain. Have dark stool. GENITOURINARY: No dysuria, hematuria.  ENDOCRINE: No polyuria, nocturia,  HEMATOLOGY: No anemia, easy bruising or bleeding SKIN: No rash or lesion. MUSCULOSKELETAL: No joint pain or arthritis.   NEUROLOGIC: No tingling, numbness, weakness.  PSYCHIATRY: No anxiety or depression.   ROS  DRUG ALLERGIES:   Allergies  Allergen Reactions  . Aspirin Other (See Comments)    Stomach problems  . Omeprazole Nausea Only    VITALS:  Blood pressure (!) 119/58, pulse 78, temperature 98.1 F (36.7 C), temperature source Oral, resp. rate 19, height 5\' 2"  (1.575 m), weight 65.8 kg (145 lb 1.6 oz), SpO2 98 %.  PHYSICAL EXAMINATION:   GENERAL:  82 y.o.-year-old patient lying in the bed in mild distress.  EYES: Pupils equal, round, reactive to light and accommodation. No scleral icterus. Has pallor noted. Extraocular muscles intact.  HEENT: Head atraumatic, normocephalic. Oropharynx and nasopharynx clear. Conjunctiva  pale. NECK:  Supple, no jugular venous distention. No thyroid enlargement, no tenderness.  LUNGS: Normal breath sounds bilaterally, no wheezing, rales,rhonchi or crepitation. No use of accessory muscles of respiration.  CARDIOVASCULAR: S1, S2 tacycardia noted. No murmurs, rubs, or gallops.  ABDOMEN: Soft, distended, tenderness right upper quadrant, mass noted right upper quadrant. Bowel sounds present sluggish. No organomegaly or mass.  EXTREMITIES: No pedal edema, cyanosis, or clubbing.  NEUROLOGIC: Cranial nerves II through XII are intact. Muscle strength 4/5 in all extremities. Sensation intact. Gait not checked.  PSYCHIATRIC: The patient is alert and oriented x 2  SKIN: No obvious rash, lesion, or ulcer.   Physical Exam LABORATORY PANEL:   CBC  Recent Labs Lab 04/02/16 0349  WBC 7.5  HGB 8.3*  HCT 24.0*  PLT 114*   ------------------------------------------------------------------------------------------------------------------  Chemistries   Recent Labs Lab 03/30/16 0313  04/02/16 0349  NA 143  < > 142  K 3.8  < > 3.5  CL 112*  < > 117*  CO2 21*  < > 18*  GLUCOSE 287*  < > 271*  BUN 44*  < > 27*  CREATININE 0.50  < > 0.51  CALCIUM 8.3*  < > 7.8*  MG 1.5*  --   --   AST 27  --   --   ALT 21  --   --   ALKPHOS 34*  --   --   BILITOT 1.2  --   --   < > = values in this interval not displayed. ------------------------------------------------------------------------------------------------------------------  Cardiac Enzymes  Recent Labs Lab 03/30/16 0313  TROPONINI <0.03   ------------------------------------------------------------------------------------------------------------------  RADIOLOGY:  Dg Abd 1 View  Result Date: 04/01/2016 CLINICAL DATA:  Nausea and vomiting. EXAM: ABDOMEN - 1 VIEW COMPARISON:  CT scan 07/04/2015. FINDINGS: Supine abdomen shows diffuse gaseous bowel distention, involving small bowel and colon. Common bile duct stent overlies  the right upper quadrant. Surgical clips in the right upper quadrant compatible prior cholecystectomy. The patient is status post left hip replacement. IMPRESSION: Diffuse gaseous bowel distention. Component of underlying ileus suspected. Bowel obstruction a possibility, but considered less likely based on imaging. Electronically Signed   By: Misty Stanley M.D.   On: 04/01/2016 11:14    ASSESSMENT AND PLAN:   Active Problems:   Diabetes mellitus, type 2 (HCC)   GI bleed   Symptomatic anemia   Paroxysmal atrial fibrillation (HCC)   Hypotension   Malignant neoplasm of pancreas (HCC)   UTI (urinary tract infection)   Hypokalemia   Sepsis (HCC)  * Acute GI bleed   Symptomatic anemia      PRBC transfused   Follow CBC.   GI consult appreciated, suggested to have endoscopy on Monday with Dr.Wohl.   Liquid diet for now. Upgrade to soft as she tolerated.   PPI BID.   Had some pain in abdomen with soft diet, change back to liquids,and get KUB xray- distended loops of intestine.   Maintain NPO again for EGD.   Had one more BM with blood on 04/01/16- Hb dropped , transfused.  * Pancreatic mass with possible malignancy   Previously seen by Dr. Grayland Ormond, biopsy was inconclusive, PET scan did not show distal mets.   Pt refused for any further work ups as per Dr. Gary Fleet note.   I had detailed discussion today with her daughter and daughter in law, they will like to arrange for hospice help on d/c.  * sepsis   IV fluids and Broad spectrum Abx on admission.   Sent cx.   So far no clear source of infection, cx negative, stopped ABX, afebrile.   Sepsis ruled out.  * DM   Hold DM meds as limited oral intake due to liquid diet.  We may need to work on her discharge plan today, pt is refusing any more work ups after EGD, so possible d/c tomorrow.  All the records are reviewed and case discussed with Care Management/Social Workerr. Management plans discussed with the patient, family and they  are in agreement.  CODE STATUS: DNR  TOTAL TIME TAKING CARE OF THIS PATIENT: 35 minutes.    POSSIBLE D/C IN 1-2 DAYS, DEPENDING ON CLINICAL CONDITION.   Vaughan Basta M.D on 04/02/2016   Between 7am to 6pm - Pager - [phone]  After 6pm go to [email or web address] - password [email or web address] [facility]  Office  [phone]  CC: Primary care physician; Tracie Harrier, MD  Note: This dictation was prepared with Dragon dictation along with smaller phrase technology. Any transcriptional errors that result from this process are unintentional.

## 2016-04-02 NOTE — Op Note (Signed)
[facility] Gastroenterology Patient Name: [name] Procedure Date: 04/02/2016 3:03 PM MRN: [id number] Account #: [id number] Date of Birth: 1928-08-09 Admit Type: Inpatient Age: 82 Room: [facility] ENDO ROOM 2 Gender: Female Note Status: Finalized Procedure:            Upper GI endoscopy Indications:          Hematemesis Providers:            Lucilla Lame MD, MD Referring MD:         Tracie Harrier, MD (Referring MD) Medicines:            Propofol per Anesthesia Complications:        No immediate complications. Procedure:            Pre-Anesthesia Assessment:                       - Prior to the procedure, a History and Physical was                        performed, and patient medications and allergies were                        reviewed. The patient's tolerance of previous                        anesthesia was also reviewed. The risks and benefits of                        the procedure and the sedation options and risks were                        discussed with the patient. All questions were                        answered, and informed consent was obtained. Prior                        Anticoagulants: The patient has taken aspirin. ASA                        Grade Assessment: II - A patient with mild systemic                        disease. After reviewing the risks and benefits, the                        patient was deemed in satisfactory condition to undergo                        the procedure.                       After obtaining informed consent, the endoscope was                        passed under direct vision. Throughout the procedure,                        the patient's blood pressure, pulse, and oxygen  saturations were monitored continuously. The Endoscope                        was introduced through the mouth, and advanced to the                        second part of duodenum. The upper GI endoscopy was                accomplished without difficulty. The patient tolerated                        the procedure well. Findings:      There were esophageal mucosal changes suspicious for long-segment       Barrett's esophagus present in the middle third of the esophagus and in       the lower third of the esophagus.      A non-bleeding Mallory-Weiss tear with stigmata of recent bleeding was       found.      The stomach was normal.      The examined duodenum was normal. Impression:           - Esophageal mucosal changes suspicious for                        long-segment Barrett's esophagus.                       - Mallory-Weiss tear.                       - Normal stomach.                       - Normal examined duodenum.                       - No specimens collected. Recommendation:       - Return patient to hospital ward for ongoing care. Procedure Code(s):    --- Professional ---                       [phone], Esophagogastroduodenoscopy, flexible, transoral;                        diagnostic, including collection of specimen(s) by                        brushing or washing, when performed (separate procedure) Diagnosis Code(s):    --- Professional ---                       K92.0, Hematemesis                       K22.6, Gastro-esophageal laceration-hemorrhage syndrome                       K22.8, Other specified diseases of esophagus CPT copyright 2016 [employer]. All rights reserved. The codes documented in this report are preliminary and upon coder review may  be revised to meet current compliance requirements. Lucilla Lame MD, MD 04/02/2016 4:05:21 PM This report has been signed electronically. Number of Addenda: 0 Note Initiated On: 04/02/2016 3:03 PM  [facility]

## 2016-04-02 NOTE — Progress Notes (Signed)
Patient Name: [name] Date of Encounter: 04/02/2016  Primary Cardiologist: Johnny Bridge, MD   Hospital Problem List     Active Problems:   GI bleed   Symptomatic anemia   Malignant neoplasm of pancreas (HCC)   Diabetes mellitus, type 2 (HCC)   Paroxysmal atrial fibrillation (HCC)   Hypotension   Sepsis ([place])   UTI (urinary tract infection)   Hypokalemia   Subjective   Feeling much better.  No chest pain or sob.  No palpitations.  Strength improved.  Inpatient Medications    . amiodarone  400 mg Oral BID  . famotidine (PEPCID) IV  20 mg Intravenous Q12H  . pantoprazole  40 mg Oral BID  . sodium chloride flush  3 mL Intravenous Q12H    Vital Signs    Vitals:   04/01/16 2111 04/01/16 2323 04/02/16 0403 04/02/16 0802  BP: 135/60 (!) 126/52 (!) 148/60 (!) 119/58  Pulse: 87 86 86 78  Resp: 18 18 19    Temp: 98.7 F (37.1 C) 98.1 F (36.7 C) 98 F (36.7 C) 98.1 F (36.7 C)  TempSrc: Oral Oral Oral Oral  SpO2: 98% 96% 98% 98%  Weight:      Height:        Intake/Output Summary (Last 24 hours) at 04/02/16 1000 Last data filed at 04/02/16 0455  Gross per 24 hour  Intake           1123.6 ml  Output                0 ml  Net           1123.6 ml   Filed Weights   03/30/16 0312 03/30/16 0829 03/31/16 1322  Weight: 133 lb (60.3 kg) 134 lb 7.7 oz (61 kg) 145 lb 1.6 oz (65.8 kg)    Physical Exam   GEN: Well nourished, well developed, in no acute distress.  HEENT: Grossly normal.  Neck: Supple, no JVD, carotid bruits, or masses. Cardiac: RRR, no murmurs, rubs, or gallops. No clubbing, cyanosis, edema.  Radials/DP/PT 2+ and equal bilaterally.  Respiratory:  Respirations regular and unlabored, clear to auscultation bilaterally. GI: protuberant, diffusely tender, BS + x 4. MS: no deformity or atrophy. Skin: warm and dry, no rash. Neuro:  Strength and sensation are intact. Psych: AAOx3.  Normal affect.  Labs    CBC  Recent Labs  03/31/16 1451  04/01/16 1330 04/02/16 0349  WBC 5.9  --  7.5  HGB 8.5* 7.2* 8.3*  HCT 24.8*  --  24.0*  MCV 93.5  --  91.8  PLT 120*  --  99991111*   Basic Metabolic Panel  Recent Labs  03/31/16 0312  NA 144  K 3.2*  CL 118*  CO2 23  GLUCOSE 205*  BUN 28*  CREATININE 0.47  CALCIUM 7.6*   Thyroid Function Tests  Recent Labs  03/30/16 1321  TSH 0.350    Telemetry    Non-tele  ECG    F/u pending  Radiology    Dg Abd 1 View  Result Date: 04/01/2016 CLINICAL DATA:  Nausea and vomiting. EXAM: ABDOMEN - 1 VIEW COMPARISON:  CT scan 07/04/2015. FINDINGS: Supine abdomen shows diffuse gaseous bowel distention, involving small bowel and colon. Common bile duct stent overlies the right upper quadrant. Surgical clips in the right upper quadrant compatible prior cholecystectomy. The patient is status post left hip replacement. IMPRESSION: Diffuse gaseous bowel distention. Component of underlying ileus suspected. Bowel obstruction a possibility, but considered less  likely based on imaging. Electronically Signed   By: Misty Stanley M.D.   On: 04/01/2016 11:14    Patient Profile     82 y/o ? 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  Placed on amio on 10/13 - converted to sinus over weekend.  Poor candidate for anticoagulation 2/2 GIB and prior h/o L thigh hematoma 12/2015.  Assessment & Plan    1.  Acute GIB/Acute Blood Loss Anemia:  Pt admitted 10/13 with complaints of weakness and dark stools over the past 3-4 days followed by increasing abd discomfort, nausea, and vomiting.  H/H 7.7/23.3 on admission - down from 11.9/36 in February.  S/p PRBC's.  H/H now stable.  Tx w/ PPI therapy.  Seen by GI.  No plan for endoscopy @ this time.  2.   Sepsis:  WBC elevated on admission with lactate of 3.4.  BPs soft in setting of GIB and rapid AFib.  Admission UA grew S agalactiae.  BC neg x 2.  Abx have since been d/c'd.  Afebrile. WBC nl.  Hemodynamically stable.  Per IM.  3.  Afib RVR:  Likely driven by [id number].  Duration unknown.  Started on PO amio on 10/13, converted to sinus by 10/14.  Not on tele but regular and normocardic on exam.  With chronic illness, Afib may be likely to occur again in the future, thus plan to continue amio.  Continue 400 bid for a total of a week with a plan to reduce to 200 daily on 10/21.  F/U ecg this am to assess QT.  Echo showed nl EF with nl sized LA. TSH nl.  CHA2DS2VASc = 4 but in setting of GIB on admission and prior spontaneous L thigh hematoma when on lovenox, she is a poor anticoagulation candidate.  4.  Pancreatic Mass:  Conservatively managed per pts wishes per outpt notes.  She has refused bx in the past.  5. DMII:  She is on glipizide and metformin @ home.  She is on nothing here.  No BG since 10/14.  CBG's not ordered.  I will order SSI.  6.  Hypokalemia:  K 3.2 on 10/14.  Follow up.  Signed, Murray Hodgkins NP 04/02/2016, 10:00 AM

## 2016-04-02 NOTE — Anesthesia Postprocedure Evaluation (Signed)
Anesthesia Post Note  Patient: Best boy  Procedure(s) Performed: Procedure(s) (LRB): ESOPHAGOGASTRODUODENOSCOPY (EGD) WITH PROPOFOL (N/A)  Patient location during evaluation: PACU Anesthesia Type: General Level of consciousness: awake and alert Pain management: pain level controlled Vital Signs Assessment: post-procedure vital signs reviewed and stable Respiratory status: spontaneous breathing, nonlabored ventilation, respiratory function stable and patient connected to nasal cannula oxygen Cardiovascular status: blood pressure returned to baseline and stable Postop Assessment: no signs of nausea or vomiting Anesthetic complications: no    Last Vitals:  Vitals:   04/02/16 1538 04/02/16 1614  BP: (!) 130/56 (!) 130/58  Pulse: 75 80  Resp: 18 16  Temp: 36.8 C 36.2 C    Last Pain:  Vitals:   04/02/16 1614  TempSrc: Tympanic  PainSc:                  Molli Barrows

## 2016-04-02 NOTE — Transfer of Care (Signed)
Immediate Anesthesia Transfer of Care Note  Patient: [name]  Procedure(s) Performed: Procedure(s): ESOPHAGOGASTRODUODENOSCOPY (EGD) WITH PROPOFOL (N/A)  Patient Location: PACU  Anesthesia Type:General  Level of Consciousness: awake  Airway & Oxygen Therapy: Patient Spontanous Breathing and Patient connected to nasal cannula oxygen  Post-op Assessment: Report given to RN and Post -op Vital signs reviewed and stable  Post vital signs: Reviewed and stable  Last Vitals:  Vitals:   04/02/16 1538 04/02/16 1614  BP: (!) 130/56 (!) 130/58  Pulse: 75 80  Resp: 18 16  Temp: 36.8 C 36.2 C    Last Pain:  Vitals:   04/02/16 1614  TempSrc: Tympanic  PainSc:       Patients Stated Pain Goal: 0 ([id number])  Complications: No apparent anesthesia complications

## 2016-04-02 NOTE — Anesthesia Procedure Notes (Signed)
Date/Time: 04/02/2016 4:05 PM Performed by: Allean Found Pre-anesthesia Checklist: Patient identified, Emergency Drugs available, Suction available, Patient being monitored and Timeout performed Patient Re-evaluated:Patient Re-evaluated prior to inductionOxygen Delivery Method: Nasal cannula Intubation Type: IV induction

## 2016-04-02 NOTE — Consult Note (Signed)
   [facility] CM Inpatient Consult   04/02/2016  [name] 10-23-1928 [id number]    Patient screened for potential [facility] services. Patient is eligible for [facility]. Electronic medical record reveals patient's discharge plan is to discharge with hospice.[facility] Care Management services not appropriate at this time. If patient's post hospital needs change please place a [facility] Care Management consult. For questions please contact:   Timithy Arons RN, [facility] Liaison  [phone]) Business Mobile [phone]) Toll free office

## 2016-04-02 NOTE — Anesthesia Preprocedure Evaluation (Signed)
Anesthesia Evaluation  Patient identified by MRN, date of birth, ID band Patient awake    Reviewed: Allergy & Precautions, H&P , NPO status , Patient's Chart, lab work & pertinent test results, reviewed documented beta blocker date and time   Airway Mallampati: II   Neck ROM: full    Dental  (+) Poor Dentition   Pulmonary neg pulmonary ROS,    Pulmonary exam normal        Cardiovascular hypertension, negative cardio ROS Normal cardiovascular exam Rate:Normal     Neuro/Psych negative neurological ROS  negative psych ROS   GI/Hepatic negative GI ROS, Neg liver ROS, (+) Hepatitis -  Endo/Other  negative endocrine ROSdiabetes  Renal/GU negative Renal ROS  negative genitourinary   Musculoskeletal   Abdominal   Peds  Hematology negative hematology ROS (+) anemia ,   Anesthesia Other Findings Past Medical History: No date: Common bile duct stricture     Comment: a. in setting of pancreatic mass s/p cbd               stenting. 11/01/2014: Diabetes mellitus, type 2 ([place]) No date: Femur fracture ([place]) No date: Hepatitis C No date: Malignant neoplasm of pancreas ([place]) No date: Pancreatic mass     Comment: a. 11/2014 U/S: 3.7x2.1 cm irregular hypoechoic              mass in genu of pancreas. Cytology & FNA               revealed scan fragments of mucinous epithelium               w/ minimal atypia. Endoscopic staging suggested              a T2N0 lesion-->pt decline Bx. No date: Pancreatitis No date: Thrombosis of main, R, & L portal veins, splen*     Comment: a. 10/2014 noted on MRI-->previously on               lovenox-->D/C'd 12/2015 2/2 L thigh hematoma. Past Surgical History: No date: ABDOMINAL HYSTERECTOMY No date: APPENDECTOMY No date: CHOLECYSTECTOMY 08/19/2015: ERCP N/A     Comment: Procedure: ENDOSCOPIC RETROGRADE               CHOLANGIOPANCREATOGRAPHY (ERCP);  Surgeon:               Lucilla Lame, MD;   Location: [facility] ENDOSCOPY;                Service: Endoscopy;  Laterality: N/A; 11/23/2015: ERCP N/A     Comment: Procedure: ENDOSCOPIC RETROGRADE               CHOLANGIOPANCREATOGRAPHY (ERCP) stent exchange               ;  Surgeon: Lucilla Lame, MD;  Location: [facility]               ENDOSCOPY;  Service: Endoscopy;  Laterality:               N/A; 11/18/2014: EUS N/A     Comment: Procedure: ESOPHAGEAL ENDOSCOPIC ULTRASOUND               (EUS) RADIAL;  Surgeon: Cora Daniels,               MD;  Location: [facility] ENDOSCOPY;  Service:               Endoscopy;  Laterality: N/A; 2005: INTRACAPSULAR CATARACT EXTRACTION Left 04/2007: ORIF of left femur fracture  1981: TOTAL HIP ARTHROPLASTY Left BMI    Body Mass Index:  26.54 kg/m     Reproductive/Obstetrics                             Anesthesia Physical Anesthesia Plan  ASA: III and emergent  Anesthesia Plan: General   Post-op Pain Management:    Induction:   Airway Management Planned:   Additional Equipment:   Intra-op Plan:   Post-operative Plan:   Informed Consent: I have reviewed the patients History and Physical, chart, labs and discussed the procedure including the risks, benefits and alternatives for the proposed anesthesia with the patient or authorized representative who has indicated his/her understanding and acceptance.   Dental Advisory Given  Plan Discussed with: CRNA  Anesthesia Plan Comments:         Anesthesia Quick Evaluation

## 2016-04-03 ENCOUNTER — Telehealth: Payer: Self-pay | Admitting: Oncology

## 2016-04-03 ENCOUNTER — Telehealth: Payer: Self-pay

## 2016-04-03 LAB — BASIC METABOLIC PANEL
ANION GAP: 6 (ref 5–15)
BUN: 23 mg/dL — AB (ref 6–20)
CHLORIDE: 116 mmol/L — AB (ref 101–111)
CO2: 20 mmol/L — AB (ref 22–32)
Calcium: 7.7 mg/dL — ABNORMAL LOW (ref 8.9–10.3)
Creatinine, Ser: 0.52 mg/dL (ref 0.44–1.00)
GFR calc Af Amer: >60 mL/min (ref >60)
GFR calc non Af Amer: >60 mL/min (ref >60)
GLUCOSE: 258 mg/dL — AB (ref 65–99)
POTASSIUM: 3.6 mmol/L (ref 3.5–5.1)
Sodium: 142 mmol/L (ref 135–145)

## 2016-04-03 LAB — GLUCOSE, CAPILLARY
Glucose-Capillary: 250 mg/dL — ABNORMAL HIGH (ref 65–99)
Glucose-Capillary: 236 mg/dL — ABNORMAL HIGH (ref 65–99)

## 2016-04-03 MED ORDER — GLIPIZIDE 5 MG PO TABS: 2.5000 mg | ORAL_TABLET | Freq: Two times a day (BID) | ORAL | 0 refills | Status: DC

## 2016-04-03 MED ORDER — PANTOPRAZOLE SODIUM 40 MG PO TBEC: 40.0000 mg | DELAYED_RELEASE_TABLET | Freq: Two times a day (BID) | ORAL | 0 refills | Status: AC

## 2016-04-03 MED ORDER — AMIODARONE HCL 400 MG PO TABS: 400.0000 mg | ORAL_TABLET | Freq: Every day | ORAL | 0 refills | Status: AC

## 2016-04-03 NOTE — Telephone Encounter (Signed)
[name] also wants you to know her mom was rushed to the hospital Friday at 2 in the morning and she was in ICU. She needed blood transfusions, she was defecating black tarry stool. They discovered it was Barrett's Esophagus. They went down her throat with a camera. She is being discharged today.

## 2016-04-03 NOTE — Progress Notes (Signed)
Inpatient Diabetes Program Recommendations  AACE/ADA: New Consensus Statement on Inpatient Glycemic Control (2015)  Target Ranges:  Prepandial:   less than 140 mg/dL      Peak postprandial:   less than 180 mg/dL (1-2 hours)      Critically ill patients:  140 - 180 mg/dL   Lab Results  Component Value Date   GLUCAP 250 (H) 04/03/2016   HGBA1C 9.5 (A) 10/18/2014    Review of Glycemic Control  Results for [name], [name] (MRN [id number]) as of 04/03/2016 09:33  Ref. Range 03/30/2016 08:37 04/02/2016 12:38 04/02/2016 17:21 04/02/2016 21:28 04/03/2016 07:40  Glucose-Capillary Latest Ref Range: 65 - 99 mg/dL 294 (H) 236 (H) 154 (H) 228 (H) 250 (H)    Diabetes history: Type 2  Current orders for Inpatient glycemic control: Novolog 0-15 units tid  Inpatient Diabetes Program Recommendations:Glipizide 2.5mg  bid ordered for discharge, may want to consider ordering low dose basal insulin instead if patient is not taking in a lot- consider Lantus 7 units hs (0.1 unit/kg)  Glipizide has a high risk of hypoglycemia.   Gentry Fitz, RN, BA, MHA, CDE Diabetes Coordinator Inpatient Diabetes Program  [phone] (Team Pager) [phone] ([place]) 04/03/2016 9:38 AM

## 2016-04-03 NOTE — Progress Notes (Signed)
While rounding, nurse suggested I visit Pt in 110. Nurse stated there had been a couple of "break downs" this morning. Pt was calm and in her bed. Daughter was upset with all the decisions she was having to make about her mother. It was difficult for her as she had numerous problems of her own (Physical and otherwise). The Pt asked for prayer which was provided. CH is available for follow up as needed.    04/03/16 1500  Clinical Encounter Type  Visited With Patient;Patient and family together;Health care provider  Visit Type Initial;Spiritual support  Referral From Nurse  Spiritual Encounters  Spiritual Needs Prayer;Emotional  Stress Factors  Family Stress Factors Health changes;Loss of control

## 2016-04-03 NOTE — Discharge Summary (Addendum)
[facility] NAME: [name]    MR#:  [id number]  DATE OF BIRTH:  1928/08/17  DATE OF ADMISSION:  03/30/2016 ADMITTING PHYSICIAN: Saundra Shelling, MD  DATE OF DISCHARGE: 04/03/2016  PRIMARY CARE PHYSICIAN: Tracie Harrier, MD    ADMISSION DIAGNOSIS:  Melena [K92.1] Generalized abdominal pain [R10.84] DNR (do not resuscitate) [Z66] Symptomatic anemia [D64.9] Malignant neoplasm of pancreas, unspecified location of malignancy ([place]) [C25.9] Intractable vomiting with nausea, unspecified vomiting type [R11.2]  DISCHARGE DIAGNOSIS:  Active Problems:   Diabetes mellitus, type 2 (HCC)   GI bleed   Symptomatic anemia   Paroxysmal atrial fibrillation (HCC)   Hypotension   Malignant neoplasm of pancreas (HCC)   UTI (urinary tract infection)   Hypokalemia   Sepsis ([place])   Hematemesis with nausea   Mallory-Weiss syndrome   Barrett's esophagus.  SECONDARY DIAGNOSIS:   Past Medical History:  Diagnosis Date  . Common bile duct stricture    a. in setting of pancreatic mass s/p cbd stenting.  . Diabetes mellitus, type 2 ([place]) 11/01/2014  . Femur fracture ([place])   . Hepatitis C   . Malignant neoplasm of pancreas ([place])   . Pancreatic mass    a. 11/2014 U/S: 3.7x2.1 cm irregular hypoechoic mass in genu of pancreas. Cytology & FNA revealed scan fragments of mucinous epithelium w/ minimal atypia. Endoscopic staging suggested a T2N0 lesion-->pt decline Bx.  . Pancreatitis   . Thrombosis of main, R, & L portal veins, splenic vein, and superior mesenteric vein    a. 10/2014 noted on MRI-->previously on lovenox-->D/C'd 12/2015 2/2 L thigh hematoma.    HOSPITAL COURSE:   Endoscopy done on 04/02/16- found to have long-segment Barrett's esophagus & Mallory-Weiss tear.  * Acute GI bleed   Symptomatic anemia      PRBC transfused   Follow CBC.   GI consult appreciated, suggested to have endoscopy on Monday with Dr.Wohl.    Liquid diet for now. Upgrade to soft as she tolerated.   PPI BID.   Had some pain in abdomen with soft diet 04/01/16, change back to liquids,and get KUB xray- distended loops of intestine.   Maintain NPO again for EGD.   Had one more BM with blood on 04/01/16- Hb dropped , transfused. Hb stable since then. Endoscopy done on 04/02/16- found to have long-segment Barrett's esophagus & Mallory-Weiss tear.  * Pancreatic mass with possible malignancy   Previously seen by Dr. Grayland Ormond, biopsy was inconclusive, PET scan did not show distal mets.   Pt refused for any further work ups as per Dr. Gary Fleet note.   I had detailed discussion with her daughter and daughter in law, they will like to arrange for hospice help on d/c.  * sepsis   IV fluids and Broad spectrum Abx on admission.   Sent cx.   So far no clear source of infection, cx negative, stopped ABX, afebrile.   Sepsis ruled out.  * DM   Hold DM meds as limited oral intake due to liquid diet.    DISCHARGE CONDITIONS:   Stable.  CONSULTS OBTAINED:  Treatment Team:  Minna Merritts, MD San Jetty, MD Lucilla Lame, MD  DRUG ALLERGIES:   Allergies  Allergen Reactions  . Aspirin Other (See Comments)    Stomach problems  . Omeprazole Nausea Only    DISCHARGE MEDICATIONS:   Current Discharge Medication List    START taking these medications   Details  amiodarone (PACERONE) 400 MG tablet  Take 1 tablet (400 mg total) by mouth daily. Qty: 30 tablet, Refills: 0    pantoprazole (PROTONIX) 40 MG tablet Take 1 tablet (40 mg total) by mouth 2 (two) times daily. Qty: 60 tablet, Refills: 0      CONTINUE these medications which have NOT CHANGED   Details  acetaminophen (TYLENOL) 500 MG tablet Take 1,000 mg by mouth every 6 (six) hours as needed for moderate pain. Reported on 08/12/2015    lipase/protease/amylase (CREON) 12000 UNITS CPEP capsule Take 36,000 Units by mouth 3 (three) times daily with meals.    potassium  chloride SA (K-DUR,KLOR-CON) 20 MEQ tablet Take 20 mEq by mouth daily. 1/2 tab qd      STOP taking these medications     metFORMIN (GLUCOPHAGE) 500 MG tablet      ranitidine (ZANTAC) 150 MG tablet      glipiZIDE (GLUCOTROL) 5 MG tablet          DISCHARGE INSTRUCTIONS:    Follow with PMD in 2-3 weeks as needed, Being discharged with hospice care.  If you experience worsening of your admission symptoms, develop shortness of breath, life threatening emergency, suicidal or homicidal thoughts you must seek medical attention immediately by calling 911 or calling your MD immediately  if symptoms less severe.  You Must read complete instructions/literature along with all the possible adverse reactions/side effects for all the Medicines you take and that have been prescribed to you. Take any new Medicines after you have completely understood and accept all the possible adverse reactions/side effects.   Please note  You were cared for by a hospitalist during your hospital stay. If you have any questions about your discharge medications or the care you received while you were in the hospital after you are discharged, you can call the unit and asked to speak with the hospitalist on call if the hospitalist that took care of you is not available. Once you are discharged, your primary care physician will handle any further medical issues. Please note that NO REFILLS for any discharge medications will be authorized once you are discharged, as it is imperative that you return to your primary care physician (or establish a relationship with a primary care physician if you do not have one) for your aftercare needs so that they can reassess your need for medications and monitor your lab values.    Today   CHIEF COMPLAINT:   Chief Complaint  Patient presents with  . Nausea  . Emesis    HISTORY OF PRESENT ILLNESS:  [name]  is a 82 y.o. female with a known history of Pancreatic mass,  mesenteric vein thrombosis, portal vein thrombosis, hepatitis C, type 2 diabetes mellitus presented to the emergency room with abdominal pain nausea and vomiting. Patient lives with her daughter at home and is currently DO NOT RESUSCITATE and DO NOT INTUBATE. Patient has this pancreatic mass which has been worked up with gastroenterology as outpatient and she recently had an ERCP done in June 2017. She also had a biliary stent placed. Patient's daughter is currently moving to [place] from [place] and is planning to take the patient to and she gets settled in [place]. Patient complains of abdominal discomfort which is aching in nature 5 out of 10 on a scale of 1-10. She has distended abdomen secondary to pancreatic mass. Patient also complains of blood in the stool. She has weakness and fatigue. No complaints of any chest pain. No history of any shortness of breath. Evaluation in  the emergency room patient was tachycardic and I laboratory workup showed elevated lactate level. Code sepsis was called she was given IV fluids 2 L and was given broad-spectrum IV antibiotics. Hospitalist service was consulted for further care of the patient.   VITAL SIGNS:  Blood pressure (!) 131/56, pulse 74, temperature 97.7 F (36.5 C), temperature source Oral, resp. rate 19, height 5\' 2"  (1.575 m), weight 65.8 kg (145 lb), SpO2 96 %.  I/O:    Intake/Output Summary (Last 24 hours) at 04/03/16 1029 Last data filed at 04/03/16 0800  Gross per 24 hour  Intake          1428.75 ml  Output                0 ml  Net          1428.75 ml    PHYSICAL EXAMINATION:   GENERAL: 82 y.o.-year-old patient lying in the bed in milddistress.  EYES: Pupils equal, round, reactive to light and accommodation. No scleral icterus. Has pallor noted.Extraocular muscles intact.  HEENT: Head atraumatic, normocephalic. Oropharynx and nasopharynx clear. Conjunctiva pale. NECK: Supple, no jugular venous distention. No thyroid  enlargement, no tenderness.  LUNGS: Normal breath sounds bilaterally, no wheezing, rales,rhonchi or crepitation. No use of accessory muscles of respiration.  CARDIOVASCULAR: S1, S2 tacycardia noted. No murmurs, rubs, or gallops.  ABDOMEN: Soft, distended, tenderness right upper quadrant, mass noted right upper quadrant. Bowel sounds present sluggish. No organomegaly or mass.  EXTREMITIES: No pedal edema, cyanosis, or clubbing.  NEUROLOGIC: Cranial nerves II through XII are intact. Muscle strength 4/5 in all extremities. Sensation intact. Gait not checked.  PSYCHIATRIC: The patient is alert and oriented x 2 SKIN: No obvious rash, lesion, or ulcer.   DATA REVIEW:   CBC  Recent Labs Lab 04/02/16 0349  WBC 7.5  HGB 8.3*  HCT 24.0*  PLT 114*    Chemistries   Recent Labs Lab 03/30/16 0313  04/03/16 0540  NA 143  < > 142  K 3.8  < > 3.6  CL 112*  < > 116*  CO2 21*  < > 20*  GLUCOSE 287*  < > 258*  BUN 44*  < > 23*  CREATININE 0.50  < > 0.52  CALCIUM 8.3*  < > 7.7*  MG 1.5*  --   --   AST 27  --   --   ALT 21  --   --   ALKPHOS 34*  --   --   BILITOT 1.2  --   --   < > = values in this interval not displayed.  Cardiac Enzymes  Recent Labs Lab 03/30/16 0313  TROPONINI <0.03    Microbiology Results  Results for orders placed or performed during the hospital encounter of 03/30/16  Urine culture     Status: Abnormal   Collection Time: 03/30/16  4:15 AM  Result Value Ref Range Status   Specimen Description URINE, CATHETERIZED  Final   Special Requests NONE  Final   Culture (A)  Final    30,000 COLONIES/mL GROUP B STREP(S.AGALACTIAE)ISOLATED Virtually 100% of S. agalactiae (Group B) strains are susceptible to Penicillin.  For Penicillin-allergic patients, Erythromycin (85-95% sensitive) and Clindamycin (80% sensitive) are drugs of choice. Contact microbiology lab to request sensitivities if  needed within 7 days. Performed at [facility]    Report Status  03/31/2016 FINAL  Final  Blood Culture (routine x 2)     Status: None (Preliminary result)  Collection Time: 03/30/16  4:23 AM  Result Value Ref Range Status   Specimen Description BLOOD  LEFT AC  Final   Special Requests   Final    BOTTLES DRAWN AEROBIC AND ANAEROBIC  ANA 6ML AER 8ML   Culture NO GROWTH 4 DAYS  Final   Report Status PENDING  Incomplete  Blood Culture (routine x 2)     Status: None (Preliminary result)   Collection Time: 03/30/16  4:23 AM  Result Value Ref Range Status   Specimen Description BLOOD  LEFT FOREARM  Final   Special Requests   Final    BOTTLES DRAWN AEROBIC AND ANAEROBIC  ANA 6ML AER 6 ML   Culture NO GROWTH 4 DAYS  Final   Report Status PENDING  Incomplete  MRSA PCR Screening     Status: None   Collection Time: 03/30/16 10:04 AM  Result Value Ref Range Status   MRSA by PCR NEGATIVE NEGATIVE Final    Comment:        The GeneXpert MRSA Assay (FDA approved for NASAL specimens only), is one component of a comprehensive MRSA colonization surveillance program. It is not intended to diagnose MRSA infection nor to guide or monitor treatment for MRSA infections.     RADIOLOGY:  Dg Abd 1 View  Result Date: 04/01/2016 CLINICAL DATA:  Nausea and vomiting. EXAM: ABDOMEN - 1 VIEW COMPARISON:  CT scan 07/04/2015. FINDINGS: Supine abdomen shows diffuse gaseous bowel distention, involving small bowel and colon. Common bile duct stent overlies the right upper quadrant. Surgical clips in the right upper quadrant compatible prior cholecystectomy. The patient is status post left hip replacement. IMPRESSION: Diffuse gaseous bowel distention. Component of underlying ileus suspected. Bowel obstruction a possibility, but considered less likely based on imaging. Electronically Signed   By: Misty Stanley M.D.   On: 04/01/2016 11:14    EKG:   Orders placed or performed during the hospital encounter of 03/30/16  . EKG 12-Lead  . EKG 12-Lead  . EKG 12-Lead  . EKG  12-Lead      Management plans discussed with the patient, family and they are in agreement.  CODE STATUS:     Code Status Orders        Start     Ordered   03/30/16 0557  Do not attempt resuscitation (DNR)  Continuous    Question Answer Comment  In the event of cardiac or respiratory ARREST Do not call a "code blue"   In the event of cardiac or respiratory ARREST Do not perform Intubation, CPR, defibrillation or ACLS   In the event of cardiac or respiratory ARREST Use medication by any route, position, wound care, and other measures to relive pain and suffering. May use oxygen, suction and manual treatment of airway obstruction as needed for comfort.      03/30/16 0559    Code Status History    Date Active Date Inactive Code Status Order ID Comments User Context   03/30/2016  3:59 AM 03/30/2016  5:59 AM DNR [id number]  Hinda Kehr, MD ED   12/26/2014  2:46 PM 12/29/2014  8:21 PM Full Code [id number]  Idelle Crouch, MD Inpatient   10/19/2014  5:49 PM 10/25/2014  7:34 PM DNR [id number]  Bettey Costa, MD Inpatient    Advance Directive Documentation   Flowsheet Row Most Recent Value  Type of Advance Directive  Living will  Pre-existing out of facility DNR order (yellow form or pink MOST form)  Yellow form placed in  chart (order not valid for inpatient use)  "MOST" Form in Place?  No data      TOTAL TIME TAKING CARE OF THIS PATIENT: 35 minutes.    Vaughan Basta M.D on 04/03/2016 at 10:29 AM  Between 7am to 6pm - Pager - [phone]  After 6pm go to [email or web address] - password [email or web address] [facility]  Office  [phone]  CC: Primary care physician; Tracie Harrier, MD   Note: This dictation was prepared with Dragon dictation along with smaller phrase technology. Any transcriptional errors that result from this process are unintentional.

## 2016-04-03 NOTE — Discharge Instructions (Signed)
Hospice to follow at home 

## 2016-04-03 NOTE — Progress Notes (Signed)
Notified Dr Anselm Jungling that pt's daughter is asking about diabetes meds after discharge. Per MD pt is not to take diabetes meds at home. If CBG >200 consult the hospice RN for further action. Pt's daughter is aware and verbalized understanding. Pt is being discharge to home with hospice.Discharge instructions given and explained to pt and pt's daughter. Both verbalized understanding. RX given. Meds reviewed.

## 2016-04-03 NOTE — Care Management (Signed)
Spoke with grandson , Barrister's clerk. States that he has spoke with Marcie Bal at [facility] about admitting  Ms Lyden (grandmother) to their services.  Telephone call to [facility] and [facility] [phone]).  States that all paper work has been completed to admit Ms. Chisenhall to their services. They will contact Howie Ill this afternoon. This agency doesn't need any information from [facility] faxed to them Ms. Mulroney will be going to [place] in 2 weeks to live with family member Rex Kras.  Discharge to home per Dr. Benjie Karvonen. Daughter will transport. Shelbie Ammons RN MSN CCM Care Management [phone]

## 2016-04-03 NOTE — Telephone Encounter (Signed)
Please schedule patient for f/u appt in the next several weeks to discuss her anti-coagulation

## 2016-04-03 NOTE — Telephone Encounter (Addendum)
She was hospitalized and it is Barrett's esophagus and she can not come tomorrow for blood work because she is too ill. [name] is asking if Woodfin Ganja can please send the bloodwork orders to Dr. Ginette Pitman so she can do it there. DONNA EXPRESSLY SAID SHE EXPECTS A CALL FROM DR. FINNEGAN AND IS ANGRY HE HAS NOT CALLED AT ALL. Please have MD call [name] regarding her mother. [name] said she needs to have a callback today or tomorrow please.

## 2016-04-03 NOTE — Telephone Encounter (Signed)
Daughter wants to know if her mothers mass is really gone. Please contact patient back

## 2016-04-04 ENCOUNTER — Other Ambulatory Visit: Payer: PPO

## 2016-04-04 ENCOUNTER — Telehealth: Payer: Self-pay | Admitting: Oncology

## 2016-04-04 ENCOUNTER — Inpatient Hospital Stay: Payer: PPO

## 2016-04-04 LAB — CULTURE, BLOOD (ROUTINE X 2)
Culture: NO GROWTH
Culture: NO GROWTH

## 2016-04-04 NOTE — Telephone Encounter (Signed)
The office she transfers to will have to request her records once she is established.

## 2016-04-04 NOTE — Telephone Encounter (Signed)
They are going to move patient back to [place] where she has other family members to help her. They will try to make the move by this weekend and will be requesting that her records be sent to a doctor in [place]. Did not specify yet which clinic/MD she will transfer to.

## 2016-04-04 NOTE — Telephone Encounter (Signed)
Talked with pt's daughter, [name]. Iscussed her mother care. Pt is leaving and moving to [place] to live with her son. She is requesting medical records. Advised her we need a medical release signed. Her Yolanda Bonine is her POA. He can sign for her. Will release records to them when we receive.

## 2016-04-05 ENCOUNTER — Telehealth: Payer: Self-pay | Admitting: Oncology

## 2016-04-05 NOTE — Telephone Encounter (Signed)
Received call from pt's daughter, [name] stating pt passed away last night.

## 2016-04-05 NOTE — Telephone Encounter (Signed)
[name] called [name] this morning to inform [name] that her mother passed away last night. She reported that [name] had vomitted a large quantity of blood. I will cancel future appointments.

## 2016-04-06 ENCOUNTER — Ambulatory Visit: Payer: PPO | Admitting: Oncology

## 2016-04-07 ENCOUNTER — Encounter: Payer: Self-pay | Admitting: Gastroenterology

## 2016-04-09 ENCOUNTER — Ambulatory Visit: Payer: PPO | Admitting: Oncology

## 2016-04-11 ENCOUNTER — Ambulatory Visit: Payer: PPO | Admitting: Oncology

## 2016-04-18 DEATH — deceased
